# Patient Record
Sex: Male | Born: 1940
Health system: Southern US, Community
[De-identification: ages and names within clinical notes are randomized; demographics above are authoritative.]

## PROBLEM LIST (undated history)

## (undated) DIAGNOSIS — H9190 Unspecified hearing loss, unspecified ear: Secondary | ICD-10-CM

## (undated) DIAGNOSIS — E236 Other disorders of pituitary gland: Secondary | ICD-10-CM

## (undated) DIAGNOSIS — R7303 Prediabetes: Secondary | ICD-10-CM

## (undated) DIAGNOSIS — E039 Hypothyroidism, unspecified: Secondary | ICD-10-CM

## (undated) DIAGNOSIS — E2740 Unspecified adrenocortical insufficiency: Secondary | ICD-10-CM

## (undated) DIAGNOSIS — D369 Benign neoplasm, unspecified site: Secondary | ICD-10-CM

## (undated) DIAGNOSIS — I10 Essential (primary) hypertension: Secondary | ICD-10-CM

## (undated) HISTORY — DX: Unspecified adrenocortical insufficiency: E27.40

## (undated) HISTORY — PX: PELVIC FRACTURE SURGERY: SHX119

## (undated) HISTORY — DX: Benign neoplasm, unspecified site: D36.9

## (undated) HISTORY — PX: JOINT REPLACEMENT: SHX530

## (undated) HISTORY — PX: COLONOSCOPY: SHX174

## (undated) HISTORY — DX: Other disorders of pituitary gland: E23.6

## (undated) HISTORY — DX: Prediabetes: R73.03

## (undated) HISTORY — DX: Hypothyroidism, unspecified: E03.9

## (undated) HISTORY — PX: HERNIA REPAIR: SHX51

---

## 2008-04-30 ENCOUNTER — Encounter (INDEPENDENT_AMBULATORY_CARE_PROVIDER_SITE_OTHER): Payer: Self-pay | Admitting: Orthopedic Surgery

## 2008-04-30 ENCOUNTER — Inpatient Hospital Stay (HOSPITAL_COMMUNITY): Admission: RE | Admit: 2008-04-30 | Discharge: 2008-05-03 | Payer: Self-pay | Admitting: Orthopedic Surgery

## 2008-07-31 HISTORY — PX: CYST REMOVAL TRUNK: SHX6283

## 2008-07-31 HISTORY — PX: BILATERAL KNEE ARTHROSCOPY: SUR91

## 2009-01-07 ENCOUNTER — Inpatient Hospital Stay (HOSPITAL_COMMUNITY): Admission: RE | Admit: 2009-01-07 | Discharge: 2009-01-11 | Payer: Self-pay | Admitting: Orthopedic Surgery

## 2010-11-07 LAB — BASIC METABOLIC PANEL
BUN: 17 mg/dL (ref 6–23)
BUN: 7 mg/dL (ref 6–23)
BUN: 8 mg/dL (ref 6–23)
CO2: 29 mEq/L (ref 19–32)
CO2: 32 mEq/L (ref 19–32)
CO2: 32 mEq/L (ref 19–32)
Calcium: 7.8 mg/dL — ABNORMAL LOW (ref 8.4–10.5)
Calcium: 8.2 mg/dL — ABNORMAL LOW (ref 8.4–10.5)
Calcium: 8.4 mg/dL (ref 8.4–10.5)
Chloride: 101 mEq/L (ref 96–112)
Chloride: 98 mEq/L (ref 96–112)
Chloride: 99 mEq/L (ref 96–112)
Creatinine, Ser: 0.83 mg/dL (ref 0.4–1.5)
Creatinine, Ser: 0.86 mg/dL (ref 0.4–1.5)
Creatinine, Ser: 1.02 mg/dL (ref 0.4–1.5)
GFR calc Af Amer: >60 mL/min (ref >60)
GFR calc Af Amer: >60 mL/min (ref >60)
GFR calc Af Amer: >60 mL/min (ref >60)
GFR calc non Af Amer: >60 mL/min (ref >60)
GFR calc non Af Amer: >60 mL/min (ref >60)
GFR calc non Af Amer: >60 mL/min (ref >60)
Glucose, Bld: 116 mg/dL — ABNORMAL HIGH (ref 70–99)
Glucose, Bld: 126 mg/dL — ABNORMAL HIGH (ref 70–99)
Glucose, Bld: 126 mg/dL — ABNORMAL HIGH (ref 70–99)
Potassium: 3.7 mEq/L (ref 3.5–5.1)
Potassium: 3.8 mEq/L (ref 3.5–5.1)
Potassium: 4.4 mEq/L (ref 3.5–5.1)
Sodium: 134 mEq/L — ABNORMAL LOW (ref 135–145)
Sodium: 136 mEq/L (ref 135–145)
Sodium: 137 mEq/L (ref 135–145)

## 2010-11-07 LAB — HEMOGLOBIN AND HEMATOCRIT, BLOOD
HCT: 25.3 % — ABNORMAL LOW (ref 39.0–52.0)
HCT: 27.7 % — ABNORMAL LOW (ref 39.0–52.0)
HCT: 29.4 % — ABNORMAL LOW (ref 39.0–52.0)
HCT: 30.3 % — ABNORMAL LOW (ref 39.0–52.0)
HCT: 34.8 % — ABNORMAL LOW (ref 39.0–52.0)
Hemoglobin: 10.1 g/dL — ABNORMAL LOW (ref 13.0–17.0)
Hemoglobin: 10.1 g/dL — ABNORMAL LOW (ref 13.0–17.0)
Hemoglobin: 11.9 g/dL — ABNORMAL LOW (ref 13.0–17.0)
Hemoglobin: 9 g/dL — ABNORMAL LOW (ref 13.0–17.0)
Hemoglobin: 9.4 g/dL — ABNORMAL LOW (ref 13.0–17.0)

## 2010-11-07 LAB — COMPREHENSIVE METABOLIC PANEL
ALT: 52 U/L (ref 0–53)
AST: 30 U/L (ref 0–37)
Albumin: 4.4 g/dL (ref 3.5–5.2)
Alkaline Phosphatase: 45 U/L (ref 39–117)
BUN: 18 mg/dL (ref 6–23)
CO2: 31 mEq/L (ref 19–32)
Calcium: 10 mg/dL (ref 8.4–10.5)
Chloride: 105 mEq/L (ref 96–112)
Creatinine, Ser: 1.09 mg/dL (ref 0.4–1.5)
GFR calc Af Amer: >60 mL/min (ref >60)
GFR calc non Af Amer: >60 mL/min (ref >60)
Glucose, Bld: 91 mg/dL (ref 70–99)
Potassium: 4.8 mEq/L (ref 3.5–5.1)
Sodium: 144 mEq/L (ref 135–145)
Total Bilirubin: 0.8 mg/dL (ref 0.3–1.2)
Total Protein: 7.3 g/dL (ref 6.0–8.3)

## 2010-11-07 LAB — CBC
HCT: 45.2 % (ref 39.0–52.0)
Hemoglobin: 15 g/dL (ref 13.0–17.0)
MCHC: 33.3 g/dL (ref 30.0–36.0)
MCV: 99.6 fL (ref 78.0–100.0)
Platelets: 204 10*3/uL (ref 150–400)
RBC: 4.53 MIL/uL (ref 4.22–5.81)
RDW: 14.4 % (ref 11.5–15.5)
WBC: 5.7 10*3/uL (ref 4.0–10.5)

## 2010-11-07 LAB — TYPE AND SCREEN
ABO/RH(D): O NEG
Antibody Screen: POSITIVE
DAT, IgG: NEGATIVE
Donor AG Type: NEGATIVE
Donor AG Type: NEGATIVE
PT AG Type: NEGATIVE

## 2010-11-07 LAB — DIFFERENTIAL
Basophils Absolute: 0.1 10*3/uL (ref 0.0–0.1)
Basophils Relative: 1 % (ref 0–1)
Eosinophils Absolute: 0.2 10*3/uL (ref 0.0–0.7)
Eosinophils Relative: 3 % (ref 0–5)
Lymphocytes Relative: 30 % (ref 12–46)
Lymphs Abs: 1.7 10*3/uL (ref 0.7–4.0)
Monocytes Absolute: 0.6 10*3/uL (ref 0.1–1.0)
Monocytes Relative: 11 % (ref 3–12)
Neutro Abs: 3.1 10*3/uL (ref 1.7–7.7)
Neutrophils Relative %: 55 % (ref 43–77)

## 2010-11-07 LAB — PROTIME-INR
INR: 1 (ref 0.00–1.49)
INR: 1.3 (ref 0.00–1.49)
INR: 1.8 — ABNORMAL HIGH (ref 0.00–1.49)
INR: 1.8 — ABNORMAL HIGH (ref 0.00–1.49)
INR: 1.9 — ABNORMAL HIGH (ref 0.00–1.49)
Prothrombin Time: 13.7 seconds (ref 11.6–15.2)
Prothrombin Time: 16.8 seconds — ABNORMAL HIGH (ref 11.6–15.2)
Prothrombin Time: 21.4 seconds — ABNORMAL HIGH (ref 11.6–15.2)
Prothrombin Time: 22.3 seconds — ABNORMAL HIGH (ref 11.6–15.2)
Prothrombin Time: 22.7 seconds — ABNORMAL HIGH (ref 11.6–15.2)

## 2010-11-07 LAB — URINALYSIS, ROUTINE W REFLEX MICROSCOPIC
Bilirubin Urine: NEGATIVE
Glucose, UA: NEGATIVE mg/dL
Hgb urine dipstick: NEGATIVE
Ketones, ur: NEGATIVE mg/dL
Nitrite: NEGATIVE
Protein, ur: NEGATIVE mg/dL
Specific Gravity, Urine: 1.027 (ref 1.005–1.030)
Urobilinogen, UA: 1 mg/dL (ref 0.0–1.0)
pH: 6 (ref 5.0–8.0)

## 2010-11-07 LAB — APTT: aPTT: 28 seconds (ref 24–37)

## 2010-12-13 NOTE — Op Note (Signed)
NAMEUSIEL, ASTARITA                 ACCOUNT NO.:  [id number]   MEDICAL RECORD NO.:  [id number]          PATIENT TYPE:  INP   LOCATION:  1304                         FACILITY:  [facility]   PHYSICIAN:  Alvy Beal, MD    DATE OF BIRTH:  [birth year]   DATE OF PROCEDURE:  DATE OF DISCHARGE:                               OPERATIVE REPORT   PREOPERATIVE DIAGNOSIS:  Large synovial facet cyst right L2-3 with  central and lateral recess stenosis and nerve irritation.   POSTOPERATIVE DIAGNOSIS:  1. L2-3 decompression with facetectomy, right L2 and facet.  2. Instrumented fusion L2-3 with regional autograft bone.   INSTRUMENTATION:  Facet screw was a Synthes cancellus small frag screw  30 mm long with a washer.   COMPLICATIONS:  None.   Please refer to Dr. Jeannetta Ellis note for dictation on the specifics of the  approach and decompression and closure.  I was his assistant for the  decompression and then he assisted me for the fusion.   OPERATIVE NOTE:  The patient was brought to the operating room and  placed supine on the operating table.  At this point time. I assisted  Dr. Darrelyn Hillock in performing a posterior approach to the lumbar spine.  After appropriate x-rays demonstrated that we were at the L2-3 level,  he then performed a standard decompression.  We resected the entire L2  spinous process and  a complete right-sided laminectomy and facetectomy  of the right L2 inferior facet complex.  On the left-hand side, we did a  partial laminotomy and lateral recess decompression.   At this point in time, once he had completed decompression and, again,  please refer to his note for specifics on the decompression, there was a  large cyst that was noted that had caused an indentation on the  posterior aspect of the thecal sac which we were able to debride in  almost its entirety, except for the portion of at that was adherent to  the thecal sac.  This was sent to Pathology for permanent specimen.   At  this point in time, once we had an adequate lateral and central  decompression, I performed a foraminotomy at both the left and the right  sides at the L2 neural foramen.  At this point, I could clearly and  easily pass a hockey stick out both neural foramen and I visualized the  nerve root indicating demonstrating adequate decompression both  centrally and in the lateral recess.  We could also freely pass a  D'Errico inferiorly underneath the remaining portion of L3 and  superiorly towards the L1 spinous process.  We did take another x-ray  confirming that we had the L2 neural foramen decompressed with a hockey  stick in the neural foramen.  At this point, with the decompression  completed, I then, under power, advanced a 3/5 drill through the L2  inferior facet across the T3 facet complex and into the L3 facet joint.  I then palpated the hole and measured it to be about 28 mm. At this  point in time, I obtained a  30-mm cancellus screw with a washer and  advanced it through the facet complex.  I had very good purchase with  the bone.  With satisfactory unilateral facet screw fixation, we then  decorticated the lateral aspect of the facet complex and the facet joint  itself and packed it with the local bone that we had harvested from the  decompression.  I then took the hockey stick and palpated out the left  neural foramen ensuring that the facet screw did not violate the neural  foramen.  I also confirmed that we did not violate laterally by directly  looking at the medial wall of the pedicle and the lateral recess.  At  this point, with the  instrumentation properly positioned, we irrigated copiously with normal  saline, obtained hemostasis using bipolar electrocautery and maintaining  with FloSeal and thrombin Gelfoam.  At this point, I scrubbed out.  Dr.  Ciro Backer then performed a standard spine closure, again please refer to  his dictation for specifics.      Alvy Beal, MD  Electronically Signed     DDB/MEDQ  D:  04/30/2008  T:  05/01/2008  Job:  [id number]   cc:   Georges Lynch. Darrelyn Hillock, M.D.  Fax: [phone]

## 2010-12-13 NOTE — Op Note (Signed)
NAMECURTIES, CONIGLIARO                 ACCOUNT NO.:  [id number]   MEDICAL RECORD NO.:  [id number]          PATIENT TYPE:  INP   LOCATION:  0002                         FACILITY:  [facility]   PHYSICIAN:  Georges Lynch. Gioffre, M.D.DATE OF BIRTH:  [birth year]   DATE OF PROCEDURE:  01/07/2009  DATE OF DISCHARGE:                               OPERATIVE REPORT   SURGEON:  Dr. Darrelyn Hillock.   ASSISTANT:  Dr. Durene Romans.   PREOPERATIVE DIAGNOSES:  1. Severe degenerative arthritis with a genu varus right knee.  2. Severe genu varus with degenerative arthritis left knee.   Routine orthopedic prep and draping of both lower extremities were  carried out.  The patient had 2 grams of IV Ancef preop.  At this time,  the left leg was exsanguinated with an Esmarch and the tourniquet was  elevated to 350 mmHg.  The knee was flexed.  An incision was made over  the anterior aspect of the left knee.  Bleeders identified and  cauterized.  Two flaps were created.  I then carried out a median  parapatellar incision in the usual fashion.  The patella was reflected  laterally, the knee was flexed and I did medial and lateral  meniscectomies and excised the anterior and posterior cruciate ligament.  Following that, initial drill hole was made in the intercondylar notch  of the femur.  A #1 jig was inserted and I removed 11 mm thickness off  the distal femur.  Following that, we measured the femur to be a size 4  on the left.  At that time, we then went on and made our appropriate cut  for a size 4 left femur.  After the femur was prepared, we then prepared  the tibia.  The left tibia measured a size 5 tibial tray.  We then  prepared the tibial plateau in the usual fashion.  I removed 4 mm  thickness off the affected medial side.  The intramedullary guide rod  was used.  We then cut our keel cut out the tibia in the usual fashion.  Following that, we then cut our notch cut out of the distal femur.  The  trial components  were inserted.  We had excellent stability, excellent  motion with a 12.5 mm thickness size 4 insert.  We then cut our patella  in the usual fashion.  We did a resurfacing procedure on the patella.  We then measured the patella be a size 41.  Three drill holes were made  in the patella.  All trial components were removed.  We thoroughly  irrigated out the knee.  Note, that we did make sure we had to the  posterior spurs and we had several loose bodies in the posterior  compartment that were removed from the distal femur as well.  After we  went through our trials, we then removed the trials and then thoroughly  irrigated out the knee.  We dried the knee out and cemented all three  components in simultaneously.  Vancomycin was used in the cement.  We  utilized a size 4 left  femur, a size 5 left tibial tray.  The insert was  a size 4 rotating platform 12.5 mm thick.  The patella was a 41-mm  patella.  After all components were cemented and the cement was  hardened, we removed all loose pieces of cement in the usual fashion.  We water picked the knee out and then inserted a Hemovac drain and  closed the knee in layers in the usual fashion.  Note, the tourniquet  was released prior to closing the wound.  Dr. Charlann Boxer continued to close  the wound and I went approached the right knee.  The right knee was  flexed.  An incision was made in the usual fashion in the midline.  Bleeders identified and cauterized.  Two flaps were created.  I then  made a median parapatellar incision and reflected the patella laterally,  flexed the knee and did medial and lateral meniscectomies and excised  the anterior and posterior cruciate ligaments.  Following that, the  initial drill hole was made in the intercondylar notch.  A #1 jig was  inserted and I removed 11 mm thickness off the distal femur.  The distal  femur then was measured to be a size 5 on the right.  Following that, we  then made our cut for a size 5  right femur, a posterior cruciate  sacrificing type.  After the femur was prepared, we then prepared the  tibia in the usual fashion for a size 5 tibial tray.  We cut our keel  cut in the usual fashion.  The intramedullary guide rods were used.  After the tibia was prepared, we then cut our notch cut out of the  distal femur on the right in the usual fashion.  We then irrigated the  knee out and inserted our trial components.  We had excellent fit with a  size 5.5 mm thickness rotating platform trial component.  After that, we  reduced the knee and had good stability and good motion.  We then  prepared our patella by doing a resurfacing procedure on the patella for  a size 41-mm patella.  Three drill holes were made in the patella.  I  thoroughly irrigated out the area after we removed the trial components  and then we cemented all three components in simultaneously.  The femur  was a size 5 right.  The tibia tray was a size 5.  The insert was a size  5, 12.5 mm thickness.  The patella was a size 41 patella.  All three  components were cemented as I mentioned.  After the cement was dried, we  irrigated the knee out and removed all loose pieces of cement.  I then  injected 10 mL of FloSeal into the posterior and medial and lateral  compartments for hemostasis purposes.  Note, the tourniquet was let down  prior to doing this.  We then removed the trial component and inserted  our permanent tibial component.  We inserted our permanent rotating  platform component, reduced the knee, took the knee through motion and  had excellent function.  A Hemovac drain was inserted and the knee was  closed in layers in the usual fashion.  The skin was closed metal  staples.  Sterile Neosporin dressings were applied to both knees.  The  patient left the operative room in satisfactory condition.           ______________________________  Georges Lynch Darrelyn Hillock, M.D.     RAG/MEDQ  D:  01/07/2009  T:   01/07/2009  Job:  [id number]

## 2010-12-13 NOTE — Op Note (Signed)
[name], Julian Harris                 ACCOUNT NO.:  [id number]   MEDICAL RECORD NO.:  [id number]          PATIENT TYPE:  INP   LOCATION:  0007                         FACILITY:  [facility]   PHYSICIAN:  Georges Lynch. Gioffre, M.D.DATE OF BIRTH:  03-19-1941   DATE OF PROCEDURE:  04/30/2008  DATE OF DISCHARGE:                               OPERATIVE REPORT   SURGEON:  1. Dr. Darrelyn Hillock on the decompressive lumbar laminectomy.  Dr. Venita Lick  was the assistant.  2. Dr. Shon Baton was the surgeon for the facet fusion on the left at L2-      3; Dr. Darrelyn Hillock is the assistant.   I am dictating only for the decompression portion.   PREOPERATIVE DIAGNOSIS:  Severe spinal stenosis at L2-3 with a large  synovial cyst at L2-3.   POSTOPERATIVE DIAGNOSIS:  Severe spinal stenosis at L2-3 with a large  synovial cyst at L2-3.   OPERATION:  1. From my standpoint was a complete decompressive lumbar laminectomy      with facetectomy on the right at L2-3.  2. Excision of a synovial cyst at L2-3.   PROCEDURE:  Under general anesthesia, the patient on a spinal frame,  routine orthopedic prepping and draping of the back carried out.  He had  1 g of IV Ancef.  Two needles were placed in the back for localization  purposes.  An x-ray was taken.  Following that, I then went down and  identified with x-rays again the exact L2 and L3 interspace.  I removed  the spinous process of L2 and a portion of L3.  We went down and  protected the dura at all times, and we decompressed the central part  and went out and then removed the facet at L2-3 on the right.  We  preserved the L2-3 facet on the left.  We did foraminotomies as well.  We identified a large synovial cyst that was strictly adhesed to the  dura.  We peeled off as much as we safely could, sent the specimen down  to the lab.  We had a nice decompression at this time.  We felt because  of sacrificing the facet which we had to do on the right where the  synovial  cyst came about that we would need to fuse the facet on the  left at L2-3 with a screw and bone graft, and Dr. Shon Baton will dictate  that part.  I was the assistant for that.  Thoroughly irrigated out the  wound at the end of the procedure.  We  injected 5 mL of FloSeal over the dura and then loosely applied some  thrombin-soaked Gelfoam.  The wound was closed in layers in usual  fashion.  I did leave a small distal portion of the deep part of the  wound open for drainage purposes.  The skin was closed with metal  staples.  Sterile Neosporin dressing was applied.           ______________________________  Georges Lynch Darrelyn Hillock, M.D.     RAG/MEDQ  D:  04/30/2008  T:  05/01/2008  Job:  [id number]   cc:   Georges Lynch. Darrelyn Hillock, M.D.  Fax: [phone]   Alvy Beal, MD  Fax: [phone]

## 2010-12-13 NOTE — Discharge Summary (Signed)
Julian Harris, Julian Harris                 ACCOUNT NO.:  [id number]   MEDICAL RECORD NO.:  [id number]          PATIENT TYPE:  INP   LOCATION:  1605                         [facility]ACILITY:  [facility]   PHYSICIAN:  Georges Lynch. Gioffre, M.D.DATE OF BIRTH:  11-25-1940   DATE OF ADMISSION:  01/07/2009  DATE OF DISCHARGE:                               DISCHARGE SUMMARY   PROCEDURE PERFORMED:  Bilateral total knee arthroplasty.   Postop he had Hemovac drains, removed the following day on January 08, 2009.  Note, also postop his hemoglobin was watched very carefully, on  January 08, 2009 his hemoglobin was 10/01.  Our plans were made immediately  for _________skilled nursing facility which we had preplanned before he  came in the hospital.  He was maintained on the heparin and Coumadin  protocol.  He will be maintained on that for 4-6 weeks postop.  While in  the hospital he was seen again on January 09, 2009, he had no postoperative  complications.  He was started on the bilateral knee machines in the  hospital from 0 to 45 degrees and it was advanced 10 degrees a day and  he was on the machines for 6 hours at a time.  On January 10, 2009 he was  doing well, his wounds looked fine.  Hemoglobin was reported at 9.4.  His INR was 1.9.  On January 11, 2009 hemoglobin was 9, he was afebrile, actually his  temperature was 99 degrees, oxygen saturation 100%.  His dressings were  changed by me and his wounds looked good.  I did discuss with him the  skilled nursing facility plans, told him we would plan to have him go to  the skilled nursing facility today or tomorrow when available.   LABORATORY DATA:  His labs on admission was white count 5700, hemoglobin  15, hematocrit 45.  His platelets were 204.  Differential was normal.  Sodium 144, potassium 4.8, chloride 105, glucose 91, BUN 80, creatinine  1.09.  His SGOT was 30, SGPT 52, alkaline phosphatase 45, total  bilirubin 0.8.  His INR was 1, prothrombin time 13.7 and PTT was  28.  Urinalysis was negative.  Chest x-rays showed no active lung disease.  He had a EKG done and it was within normal limits.   DISCHARGE CONDITION:  Improved.   DISCHARGE DIET:  Regular diet.   DISCHARGE MEDICATIONS:  1. He will be on Coumadin 5 mg a day.  2. Ferrous sulfate 325 mg t.i.d.  3. Lisinopril 20 mg a day.  4. Hydrochlorothiazide 12.5 mg a day.  5. Robaxin 500 mg t.i.d. p.r.n. for spasms by mouth  6. Ambien 5 mg by mouth p.r.n. for sleep.  7. Dulcolax suppository q.6 h p.r.n. constipation.  8. Percocet 10/650 one every 4 hours p.r.n. for pain.   DISCHARGE INSTRUCTIONS:  1. Ambulate full weightbearing with a walker.  2. He must be seen by me in the office 2 weeks from the day of      surgery.  Surgery date was January 07, 2009.  3. He will have to have INRs  done weekly.  4. On Wednesday of this week he should have a CBC and INR done.  That      should be faxed to my office at [phone].  His INRs after that      should be done on a weekly basis.  5. His dressing should be changed daily.           ______________________________  Georges Lynch Darrelyn Hillock, M.D.     RAG/MEDQ  D:  01/11/2009  T:  01/11/2009  Job:  [id number]

## 2010-12-16 NOTE — Discharge Summary (Signed)
Julian Harris, Julian Harris                 ACCOUNT NO.:  [id number]   MEDICAL RECORD NO.:  [id number]          PATIENT TYPE:  INP   LOCATION:  1304                         [facility]   PHYSICIAN:  Georges Lynch. Gioffre, M.D.DATE OF BIRTH:  1941/04/07   DATE OF ADMISSION:  04/30/2008  DATE OF DISCHARGE:  05/03/2008                               DISCHARGE SUMMARY   ADMISSION DIAGNOSES:  1. Spinal stenosis, L2-3.  2. Hypertension.   DISCHARGE DIAGNOSES:  1. Status post decompressive lumbar laminectomy at L2-3 with excision      of synovial cyst at L2-3 with facet fusion at L2-3 with bone graft.  2. Hypertension.   SURGICAL PROCEDURE:  On 08/01/2007, the patient was taken to the OR by  Dr. Worthy Rancher, assisted by Dr. Venita Lick.  The patient underwent  general anesthesia and had a decompressive lumbar laminectomy at L2-3  with excision of synovial cyst and a facet fusion at L2-3 with bone  graft.  This was done under general anesthesia.  The synovial cyst was  sent to Pathology.  Estimated blood loss was 250 mL.  There were no  complications.   CONSULTS:  Routine physical therapy and case management consults.   HOSPITAL COURSE:  On 08/01/2007, the patient was admitted to [facility] under the care of Dr. Worthy Rancher.  The patient was taken to  the OR where the above-mentioned surgical excision was performed without  any complications.  The patient was transferred to the recovery room and  then to the orthopedic floor in good condition on IV pain medicines and  antibiotics.  The patient then incurred 3 days postoperative course on  the orthopedic floor in which the patient did very well.  The patient's  vital signs remained stable.  The patient's wound remained benign for  any signs of infection.  The patient remained neurologically intact.  The patient was able to transition from IV medicines to p.o. meds well.  The patient was able to ambulate with physical therapy without  any  issues and on postop day #3 the patient was medically and orthopedically  stable where he could be discharged home.  The patient was  neurologically intact.  Pain was significantly better.  He was  tolerating his p.o. meds well and was discharged in good condition.   LABS:  CBC on admission found WBC 5.3, hemoglobin 14.9, hematocrit 44.1,  platelets 194,000.  INR on admission was 1.0 with PTT 22.  Routine  chemistries on admission were within normal limits with a glucose of  108.  Estimated GFR greater than 60.  Routine urinalysis was normal.   DISCHARGE INSTRUCTIONS:  1. Diet:  No restrictions.  2. Activity:  Slowly increase activity as instructed by Physical      Therapy with use of a walker.  3. Wound care:  The patient is to change his dressing daily.  4. Follow-up:  The patient is to have follow-up appointment with Dr.      Darrelyn Hillock in his office 2 weeks from date of surgery.  The patient is  to call [phone] for that follow-up appointment.   DISCHARGE MEDICATIONS:  1. Percocet 10/650 one tablet every 4-6 hours for pain if needed.  2. Robaxin 500 mg once every 6 hours for muscle spasms if needed.  3. Lisinopril 20/12.5 mg once a day.  4. Omega 3 fish oil 2 tablets a day.  5. Vitamin C 1000 mg 2 tablets once a day.  6. Vitamin E 1000 international units daily.   DISCHARGE CONDITION:  The patient's condition upon discharge to home is  listed as improved and good.      Jamelle Rushing, P.A.    ______________________________  Georges Lynch Darrelyn Hillock, M.D.    RWK/MEDQ  D:  05/20/2008  T:  05/20/2008  Job:  [id number]

## 2011-05-01 LAB — CBC
HCT: 44.1
Hemoglobin: 14.9
MCHC: 33.7
MCV: 98.9
Platelets: 194
RBC: 4.46
RDW: 13.6
WBC: 5.3

## 2011-05-01 LAB — COMPREHENSIVE METABOLIC PANEL
ALT: 47
AST: 31
Albumin: 4.1
Alkaline Phosphatase: 43
BUN: 19
CO2: 31
Calcium: 10.2
Chloride: 101
Creatinine, Ser: 0.87
GFR calc Af Amer: >60
GFR calc non Af Amer: >60
Glucose, Bld: 108 — ABNORMAL HIGH
Potassium: 4.8
Sodium: 143
Total Bilirubin: 1.1
Total Protein: 7.2

## 2011-05-01 LAB — URINALYSIS, ROUTINE W REFLEX MICROSCOPIC
Bilirubin Urine: NEGATIVE
Glucose, UA: NEGATIVE
Hgb urine dipstick: NEGATIVE
Ketones, ur: NEGATIVE
Nitrite: NEGATIVE
Protein, ur: NEGATIVE
Specific Gravity, Urine: 1.023
Urobilinogen, UA: 0.2
pH: 5.5

## 2011-05-01 LAB — DIFFERENTIAL
Basophils Absolute: 0
Basophils Relative: 1
Eosinophils Absolute: 0.2
Eosinophils Relative: 3
Lymphocytes Relative: 29
Lymphs Abs: 1.5
Monocytes Absolute: 0.6
Monocytes Relative: 11
Neutro Abs: 3
Neutrophils Relative %: 57

## 2011-05-01 LAB — PROTIME-INR
INR: 1
Prothrombin Time: 13.1

## 2011-05-01 LAB — APTT: aPTT: 22 — ABNORMAL LOW

## 2011-08-09 DIAGNOSIS — J019 Acute sinusitis, unspecified: Secondary | ICD-10-CM | POA: Diagnosis not present

## 2011-11-01 DIAGNOSIS — I10 Essential (primary) hypertension: Secondary | ICD-10-CM | POA: Diagnosis not present

## 2011-11-01 DIAGNOSIS — Z125 Encounter for screening for malignant neoplasm of prostate: Secondary | ICD-10-CM | POA: Diagnosis not present

## 2011-11-01 DIAGNOSIS — E559 Vitamin D deficiency, unspecified: Secondary | ICD-10-CM | POA: Diagnosis not present

## 2011-11-01 DIAGNOSIS — R7301 Impaired fasting glucose: Secondary | ICD-10-CM | POA: Diagnosis not present

## 2012-05-01 DIAGNOSIS — I10 Essential (primary) hypertension: Secondary | ICD-10-CM | POA: Diagnosis not present

## 2012-05-01 DIAGNOSIS — Z125 Encounter for screening for malignant neoplasm of prostate: Secondary | ICD-10-CM | POA: Diagnosis not present

## 2012-05-01 DIAGNOSIS — E669 Obesity, unspecified: Secondary | ICD-10-CM | POA: Diagnosis not present

## 2012-05-01 DIAGNOSIS — Z0000 Encounter for general adult medical examination without abnormal findings: Secondary | ICD-10-CM | POA: Diagnosis not present

## 2012-05-01 DIAGNOSIS — E559 Vitamin D deficiency, unspecified: Secondary | ICD-10-CM | POA: Diagnosis not present

## 2012-05-01 DIAGNOSIS — R7301 Impaired fasting glucose: Secondary | ICD-10-CM | POA: Diagnosis not present

## 2012-05-08 ENCOUNTER — Other Ambulatory Visit: Payer: Self-pay | Admitting: Internal Medicine

## 2012-05-08 DIAGNOSIS — E559 Vitamin D deficiency, unspecified: Secondary | ICD-10-CM | POA: Diagnosis not present

## 2012-05-08 DIAGNOSIS — R7301 Impaired fasting glucose: Secondary | ICD-10-CM | POA: Diagnosis not present

## 2012-05-08 DIAGNOSIS — R7401 Elevation of levels of liver transaminase levels: Secondary | ICD-10-CM | POA: Diagnosis not present

## 2012-05-08 DIAGNOSIS — N182 Chronic kidney disease, stage 2 (mild): Secondary | ICD-10-CM | POA: Diagnosis not present

## 2012-05-08 DIAGNOSIS — R7402 Elevation of levels of lactic acid dehydrogenase (LDH): Secondary | ICD-10-CM

## 2012-05-08 DIAGNOSIS — I10 Essential (primary) hypertension: Secondary | ICD-10-CM | POA: Diagnosis not present

## 2012-05-13 ENCOUNTER — Ambulatory Visit
Admission: RE | Admit: 2012-05-13 | Discharge: 2012-05-13 | Disposition: A | Discharge status: Home / Self Care | Payer: Medicare Other | Source: Ambulatory Visit | Attending: Internal Medicine | Admitting: Internal Medicine

## 2012-05-13 DIAGNOSIS — R7402 Elevation of levels of lactic acid dehydrogenase (LDH): Secondary | ICD-10-CM

## 2012-05-13 DIAGNOSIS — K7689 Other specified diseases of liver: Secondary | ICD-10-CM | POA: Diagnosis not present

## 2012-10-30 DIAGNOSIS — I10 Essential (primary) hypertension: Secondary | ICD-10-CM | POA: Diagnosis not present

## 2012-10-30 DIAGNOSIS — R7301 Impaired fasting glucose: Secondary | ICD-10-CM | POA: Diagnosis not present

## 2012-10-30 DIAGNOSIS — E559 Vitamin D deficiency, unspecified: Secondary | ICD-10-CM | POA: Diagnosis not present

## 2012-11-06 DIAGNOSIS — E559 Vitamin D deficiency, unspecified: Secondary | ICD-10-CM | POA: Diagnosis not present

## 2012-11-06 DIAGNOSIS — R7301 Impaired fasting glucose: Secondary | ICD-10-CM | POA: Diagnosis not present

## 2012-11-06 DIAGNOSIS — I10 Essential (primary) hypertension: Secondary | ICD-10-CM | POA: Diagnosis not present

## 2012-11-06 DIAGNOSIS — N182 Chronic kidney disease, stage 2 (mild): Secondary | ICD-10-CM | POA: Diagnosis not present

## 2013-05-05 DIAGNOSIS — E559 Vitamin D deficiency, unspecified: Secondary | ICD-10-CM | POA: Diagnosis not present

## 2013-05-05 DIAGNOSIS — I10 Essential (primary) hypertension: Secondary | ICD-10-CM | POA: Diagnosis not present

## 2013-05-05 DIAGNOSIS — Z1331 Encounter for screening for depression: Secondary | ICD-10-CM | POA: Diagnosis not present

## 2013-05-05 DIAGNOSIS — Z125 Encounter for screening for malignant neoplasm of prostate: Secondary | ICD-10-CM | POA: Diagnosis not present

## 2013-05-05 DIAGNOSIS — Z0000 Encounter for general adult medical examination without abnormal findings: Secondary | ICD-10-CM | POA: Diagnosis not present

## 2013-05-05 DIAGNOSIS — R7301 Impaired fasting glucose: Secondary | ICD-10-CM | POA: Diagnosis not present

## 2013-05-12 DIAGNOSIS — I10 Essential (primary) hypertension: Secondary | ICD-10-CM | POA: Diagnosis not present

## 2013-05-12 DIAGNOSIS — R7301 Impaired fasting glucose: Secondary | ICD-10-CM | POA: Diagnosis not present

## 2013-05-12 DIAGNOSIS — E559 Vitamin D deficiency, unspecified: Secondary | ICD-10-CM | POA: Diagnosis not present

## 2013-05-12 DIAGNOSIS — N182 Chronic kidney disease, stage 2 (mild): Secondary | ICD-10-CM | POA: Diagnosis not present

## 2013-10-03 DIAGNOSIS — Z96659 Presence of unspecified artificial knee joint: Secondary | ICD-10-CM | POA: Diagnosis not present

## 2013-10-03 DIAGNOSIS — IMO0002 Reserved for concepts with insufficient information to code with codable children: Secondary | ICD-10-CM | POA: Diagnosis not present

## 2013-10-03 DIAGNOSIS — M1710 Unilateral primary osteoarthritis, unspecified knee: Secondary | ICD-10-CM | POA: Diagnosis not present

## 2013-10-28 DIAGNOSIS — R7301 Impaired fasting glucose: Secondary | ICD-10-CM | POA: Diagnosis not present

## 2013-10-28 DIAGNOSIS — I10 Essential (primary) hypertension: Secondary | ICD-10-CM | POA: Diagnosis not present

## 2013-10-28 DIAGNOSIS — E559 Vitamin D deficiency, unspecified: Secondary | ICD-10-CM | POA: Diagnosis not present

## 2013-10-28 DIAGNOSIS — L723 Sebaceous cyst: Secondary | ICD-10-CM | POA: Diagnosis not present

## 2013-11-10 DIAGNOSIS — I10 Essential (primary) hypertension: Secondary | ICD-10-CM | POA: Diagnosis not present

## 2013-11-10 DIAGNOSIS — R7301 Impaired fasting glucose: Secondary | ICD-10-CM | POA: Diagnosis not present

## 2013-11-10 DIAGNOSIS — E559 Vitamin D deficiency, unspecified: Secondary | ICD-10-CM | POA: Diagnosis not present

## 2013-11-10 DIAGNOSIS — N182 Chronic kidney disease, stage 2 (mild): Secondary | ICD-10-CM | POA: Diagnosis not present

## 2013-11-25 ENCOUNTER — Encounter (INDEPENDENT_AMBULATORY_CARE_PROVIDER_SITE_OTHER): Payer: Self-pay | Admitting: General Surgery

## 2013-11-25 ENCOUNTER — Ambulatory Visit (INDEPENDENT_AMBULATORY_CARE_PROVIDER_SITE_OTHER): Payer: Medicare Other | Admitting: General Surgery

## 2013-11-25 VITALS — BP 125/75 | HR 77 | Temp 97.0°F | Resp 16 | Ht 69.0 in | Wt 215.6 lb

## 2013-11-25 DIAGNOSIS — L723 Sebaceous cyst: Secondary | ICD-10-CM

## 2013-11-25 DIAGNOSIS — L720 Epidermal cyst: Secondary | ICD-10-CM | POA: Insufficient documentation

## 2013-11-25 NOTE — Progress Notes (Signed)
Patient ID: Julian Harris, male   DOB: 25-Jul-1941, 73 y.o.   MRN: [id number]  Chief Complaint  Patient presents with  . Cyst    HPI Julian Harris is a 73 y.o. male.  He is referred by Dr. Thressa Sheller at [facility] for evaluation of an epidermoid cyst of the posterior neck.  This mass has been present for at least 2 years. It will occasionally become tender and drain. Intermittently it is asymptomatic. 4 which ago it became as large as 2.5 cm it was painful and drained. About a settling down again.  Comorbidities include hypertension, obesity, impaired glucose tolerance, mild renal insufficiency, fatty liver  HPI  History reviewed. No pertinent past medical history.  Past Surgical History  Procedure Laterality Date  . Bilateral knee arthroscopy  2010  . Cyst removal trunk  2010    History reviewed. No pertinent family history.  Social History History  Substance Use Topics  . Smoking status: Former Smoker    Quit date: 11/25/1973  . Smokeless tobacco: Not on file  . Alcohol Use: No    No Known Allergies  Current Outpatient Prescriptions  Medication Sig Dispense Refill  . lisinopril-hydrochlorothiazide (PRINZIDE,ZESTORETIC) 20-12.5 MG per tablet Take 1 tablet by mouth daily.       No current facility-administered medications for this visit.    Review of Systems Review of Systems  Constitutional: Negative for fever, chills and unexpected weight change.  HENT: Negative for congestion, hearing loss, sore throat, trouble swallowing and voice change.   Eyes: Negative for visual disturbance.  Respiratory: Negative for cough and wheezing.   Cardiovascular: Negative for chest pain, palpitations and leg swelling.  Gastrointestinal: Negative for nausea, vomiting, abdominal pain, diarrhea, constipation, blood in stool, abdominal distention, anal bleeding and rectal pain.  Genitourinary: Negative for hematuria and difficulty urinating.  Musculoskeletal:  Negative for arthralgias.  Skin: Negative for rash and wound.  Neurological: Negative for seizures, syncope, weakness and headaches.  Hematological: Negative for adenopathy. Does not bruise/bleed easily.  Psychiatric/Behavioral: Negative for confusion.    Blood pressure 125/75, pulse 77, temperature 97 F (36.1 C), resp. rate 16, height 5\' 9"  (1.753 m), weight 215 lb 9.6 oz (97.796 kg).  Physical Exam Physical Exam  Constitutional: He is oriented to person, place, and time. He appears well-developed and well-nourished. No distress.  HENT:  Head: Normocephalic.  Nose: Nose normal.  Mouth/Throat: No oropharyngeal exudate.  Eyes: Conjunctivae and EOM are normal. Pupils are equal, round, and reactive to light. Right eye exhibits no discharge. Left eye exhibits no discharge. No scleral icterus.  Neck: Normal range of motion. Neck supple. No JVD present. No tracheal deviation present. No thyromegaly present.  In the posterior neck, to the right of the midline, just below the hairline is a 1.5 cm cystic mass with a small opening. There is no saline as her by mouth drainage. This bleeds easily. Not obviously infected.  Cardiovascular: Normal rate, regular rhythm, normal heart sounds and intact distal pulses.   No murmur heard. Pulmonary/Chest: Effort normal and breath sounds normal. No stridor. No respiratory distress. He has no wheezes. He has no rales. He exhibits no tenderness.  Abdominal: Soft. Bowel sounds are normal. He exhibits no distension and no mass. There is no tenderness. There is no rebound and no guarding.  Musculoskeletal: Normal range of motion. He exhibits no edema and no tenderness.  Lymphadenopathy:    He has no cervical adenopathy.  Neurological: He is alert and oriented to  person, place, and time. He has normal reflexes. Coordination normal.  Skin: Skin is warm and dry. No rash noted. He is not diaphoretic. No erythema. No pallor.  Psychiatric: He has a normal mood and  affect. His behavior is normal. Judgment and thought content normal.    Data Reviewed Office notes from [place]  Assessment    Epidermoid cyst, right posterior neck and hairline, 1.5 cm.  Hypertension  Mild obesity  Impaired glucose Tolerance     Plan    Scheduled for excision of epidermoid cyst right posterior neck, 1.5 cm local anesthesia in the office in the next week or [place] 11/25/2013, 9:39 AM

## 2013-11-25 NOTE — Patient Instructions (Signed)
You have an epidermoid cyst of the posterior neck that has been getting infected intermittently over the years. This will continue to happen until you do something about it.  We have decided to schedule you for excision of the posterior neck cyst in the office sometime in the next week or so.

## 2013-12-05 ENCOUNTER — Other Ambulatory Visit (INDEPENDENT_AMBULATORY_CARE_PROVIDER_SITE_OTHER): Payer: Self-pay | Admitting: General Surgery

## 2013-12-05 ENCOUNTER — Ambulatory Visit (INDEPENDENT_AMBULATORY_CARE_PROVIDER_SITE_OTHER): Payer: Medicare Other | Admitting: General Surgery

## 2013-12-05 ENCOUNTER — Encounter (INDEPENDENT_AMBULATORY_CARE_PROVIDER_SITE_OTHER): Payer: Self-pay | Admitting: General Surgery

## 2013-12-05 VITALS — BP 119/84 | HR 70 | Temp 97.5°F | Resp 16 | Ht 69.0 in | Wt 213.0 lb

## 2013-12-05 DIAGNOSIS — L720 Epidermal cyst: Secondary | ICD-10-CM

## 2013-12-05 DIAGNOSIS — L723 Sebaceous cyst: Secondary | ICD-10-CM

## 2013-12-05 NOTE — Progress Notes (Signed)
Patient ID: Julian Harris, male   DOB: 11-17-1940, 73 y.o.   MRN: [id number] History:  this patient returns electively for excision of the chronic inflamed cyst of his right posterior neck at the hairline. I discussed the procedure in detail. I discussed the risks and wound care.  Procedure note: Room 3. Prone position with lots of cushioning. Posterior neck prepped with Betadine. 1% Xylocaine with epinephrine local. Conservative transverse elliptical incision. Chronically inflamed cyst excised all the way down to the fascia. No pus encountered. Specimen sent to pathology. Hemostasis very good. Tissues closed with interrupted 3-0 Vicryl. No skin sutures. Dry gauze bandage. Tolerated well  Assessment: Chronically inflamed epidermoid cyst right posterior neck and hairline  Plan: Wound care discussed. Written instructions given Will call pathology report the patient next Tuesday or Wednesday Return as needed or if there is bleeding or infection.   Edsel Petrin. Dalbert Batman, M.D., [facility] General and Minimally invasive Surgery Breast and Colorectal Surgery Office:   [phone] Pager:   [phone]

## 2013-12-05 NOTE — Patient Instructions (Signed)
We removed the chronically infected cyst from your posterior neck. This was sent to the pathology laboratory. We should be able to call the pathology report to you next Tuesday or Wednesday.  As we discussed, I closed the incision loosely so that it would be able to drain a little bit and lower the chance of infection.  You may take a quick shower, starting tomorrow night. No tub baths. No hot tubs.  Expect a little drainage. Keep a dry gauze bandage or a large Band-Aid on this at all times other than when you are taking a shower.  Return to see Dr. Dalbert Batman if there are any problems.

## 2013-12-08 NOTE — Progress Notes (Signed)
Quick Note:  Inform patient of Pathology report,. ______ 

## 2013-12-09 ENCOUNTER — Telehealth (INDEPENDENT_AMBULATORY_CARE_PROVIDER_SITE_OTHER): Payer: Self-pay | Admitting: General Surgery

## 2013-12-09 NOTE — Telephone Encounter (Signed)
Called patient to let him know that his path report was good, no sign of cancer

## 2014-05-05 DIAGNOSIS — I10 Essential (primary) hypertension: Secondary | ICD-10-CM | POA: Diagnosis not present

## 2014-05-05 DIAGNOSIS — E559 Vitamin D deficiency, unspecified: Secondary | ICD-10-CM | POA: Diagnosis not present

## 2014-05-05 DIAGNOSIS — Z1389 Encounter for screening for other disorder: Secondary | ICD-10-CM | POA: Diagnosis not present

## 2014-05-05 DIAGNOSIS — Z0000 Encounter for general adult medical examination without abnormal findings: Secondary | ICD-10-CM | POA: Diagnosis not present

## 2014-05-05 DIAGNOSIS — R7301 Impaired fasting glucose: Secondary | ICD-10-CM | POA: Diagnosis not present

## 2014-05-11 DIAGNOSIS — H25033 Anterior subcapsular polar age-related cataract, bilateral: Secondary | ICD-10-CM | POA: Diagnosis not present

## 2014-05-12 DIAGNOSIS — E559 Vitamin D deficiency, unspecified: Secondary | ICD-10-CM | POA: Diagnosis not present

## 2014-05-12 DIAGNOSIS — I10 Essential (primary) hypertension: Secondary | ICD-10-CM | POA: Diagnosis not present

## 2014-05-12 DIAGNOSIS — R7301 Impaired fasting glucose: Secondary | ICD-10-CM | POA: Diagnosis not present

## 2014-05-12 DIAGNOSIS — N182 Chronic kidney disease, stage 2 (mild): Secondary | ICD-10-CM | POA: Diagnosis not present

## 2014-05-12 DIAGNOSIS — K7581 Nonalcoholic steatohepatitis (NASH): Secondary | ICD-10-CM | POA: Diagnosis not present

## 2014-11-04 DIAGNOSIS — R7301 Impaired fasting glucose: Secondary | ICD-10-CM | POA: Diagnosis not present

## 2014-11-04 DIAGNOSIS — I10 Essential (primary) hypertension: Secondary | ICD-10-CM | POA: Diagnosis not present

## 2014-11-04 DIAGNOSIS — E559 Vitamin D deficiency, unspecified: Secondary | ICD-10-CM | POA: Diagnosis not present

## 2014-11-11 DIAGNOSIS — N182 Chronic kidney disease, stage 2 (mild): Secondary | ICD-10-CM | POA: Diagnosis not present

## 2014-11-11 DIAGNOSIS — R7309 Other abnormal glucose: Secondary | ICD-10-CM | POA: Diagnosis not present

## 2014-11-11 DIAGNOSIS — I10 Essential (primary) hypertension: Secondary | ICD-10-CM | POA: Diagnosis not present

## 2014-11-11 DIAGNOSIS — E559 Vitamin D deficiency, unspecified: Secondary | ICD-10-CM | POA: Diagnosis not present

## 2015-05-13 DIAGNOSIS — R7309 Other abnormal glucose: Secondary | ICD-10-CM | POA: Diagnosis not present

## 2015-05-13 DIAGNOSIS — F17211 Nicotine dependence, cigarettes, in remission: Secondary | ICD-10-CM | POA: Diagnosis not present

## 2015-05-13 DIAGNOSIS — Z0000 Encounter for general adult medical examination without abnormal findings: Secondary | ICD-10-CM | POA: Diagnosis not present

## 2015-05-13 DIAGNOSIS — Z1389 Encounter for screening for other disorder: Secondary | ICD-10-CM | POA: Diagnosis not present

## 2015-05-13 DIAGNOSIS — I10 Essential (primary) hypertension: Secondary | ICD-10-CM | POA: Diagnosis not present

## 2015-05-13 DIAGNOSIS — E559 Vitamin D deficiency, unspecified: Secondary | ICD-10-CM | POA: Diagnosis not present

## 2015-05-13 DIAGNOSIS — Z125 Encounter for screening for malignant neoplasm of prostate: Secondary | ICD-10-CM | POA: Diagnosis not present

## 2015-05-20 DIAGNOSIS — F17211 Nicotine dependence, cigarettes, in remission: Secondary | ICD-10-CM | POA: Diagnosis not present

## 2015-05-20 DIAGNOSIS — E559 Vitamin D deficiency, unspecified: Secondary | ICD-10-CM | POA: Diagnosis not present

## 2015-05-20 DIAGNOSIS — N182 Chronic kidney disease, stage 2 (mild): Secondary | ICD-10-CM | POA: Diagnosis not present

## 2015-05-20 DIAGNOSIS — R7309 Other abnormal glucose: Secondary | ICD-10-CM | POA: Diagnosis not present

## 2015-05-20 DIAGNOSIS — I129 Hypertensive chronic kidney disease with stage 1 through stage 4 chronic kidney disease, or unspecified chronic kidney disease: Secondary | ICD-10-CM | POA: Diagnosis not present

## 2015-11-11 DIAGNOSIS — I129 Hypertensive chronic kidney disease with stage 1 through stage 4 chronic kidney disease, or unspecified chronic kidney disease: Secondary | ICD-10-CM | POA: Diagnosis not present

## 2015-11-11 DIAGNOSIS — E559 Vitamin D deficiency, unspecified: Secondary | ICD-10-CM | POA: Diagnosis not present

## 2015-11-11 DIAGNOSIS — R7309 Other abnormal glucose: Secondary | ICD-10-CM | POA: Diagnosis not present

## 2015-11-18 DIAGNOSIS — N182 Chronic kidney disease, stage 2 (mild): Secondary | ICD-10-CM | POA: Diagnosis not present

## 2015-11-18 DIAGNOSIS — Z23 Encounter for immunization: Secondary | ICD-10-CM | POA: Diagnosis not present

## 2015-11-18 DIAGNOSIS — E559 Vitamin D deficiency, unspecified: Secondary | ICD-10-CM | POA: Diagnosis not present

## 2015-11-18 DIAGNOSIS — F17211 Nicotine dependence, cigarettes, in remission: Secondary | ICD-10-CM | POA: Diagnosis not present

## 2015-11-18 DIAGNOSIS — R7309 Other abnormal glucose: Secondary | ICD-10-CM | POA: Diagnosis not present

## 2015-11-18 DIAGNOSIS — I129 Hypertensive chronic kidney disease with stage 1 through stage 4 chronic kidney disease, or unspecified chronic kidney disease: Secondary | ICD-10-CM | POA: Diagnosis not present

## 2015-11-29 DIAGNOSIS — H6120 Impacted cerumen, unspecified ear: Secondary | ICD-10-CM | POA: Diagnosis not present

## 2015-12-29 DIAGNOSIS — H903 Sensorineural hearing loss, bilateral: Secondary | ICD-10-CM | POA: Diagnosis not present

## 2016-05-19 DIAGNOSIS — Z125 Encounter for screening for malignant neoplasm of prostate: Secondary | ICD-10-CM | POA: Diagnosis not present

## 2016-05-19 DIAGNOSIS — I129 Hypertensive chronic kidney disease with stage 1 through stage 4 chronic kidney disease, or unspecified chronic kidney disease: Secondary | ICD-10-CM | POA: Diagnosis not present

## 2016-05-19 DIAGNOSIS — E559 Vitamin D deficiency, unspecified: Secondary | ICD-10-CM | POA: Diagnosis not present

## 2016-05-19 DIAGNOSIS — Z1389 Encounter for screening for other disorder: Secondary | ICD-10-CM | POA: Diagnosis not present

## 2016-05-19 DIAGNOSIS — Z23 Encounter for immunization: Secondary | ICD-10-CM | POA: Diagnosis not present

## 2016-05-19 DIAGNOSIS — Z0000 Encounter for general adult medical examination without abnormal findings: Secondary | ICD-10-CM | POA: Diagnosis not present

## 2016-05-19 DIAGNOSIS — I10 Essential (primary) hypertension: Secondary | ICD-10-CM | POA: Diagnosis not present

## 2016-05-19 DIAGNOSIS — R7309 Other abnormal glucose: Secondary | ICD-10-CM | POA: Diagnosis not present

## 2016-05-30 DIAGNOSIS — N182 Chronic kidney disease, stage 2 (mild): Secondary | ICD-10-CM | POA: Diagnosis not present

## 2016-05-30 DIAGNOSIS — R7309 Other abnormal glucose: Secondary | ICD-10-CM | POA: Diagnosis not present

## 2016-05-30 DIAGNOSIS — E559 Vitamin D deficiency, unspecified: Secondary | ICD-10-CM | POA: Diagnosis not present

## 2016-05-30 DIAGNOSIS — I10 Essential (primary) hypertension: Secondary | ICD-10-CM | POA: Diagnosis not present

## 2016-11-14 DIAGNOSIS — R7309 Other abnormal glucose: Secondary | ICD-10-CM | POA: Diagnosis not present

## 2016-11-14 DIAGNOSIS — I10 Essential (primary) hypertension: Secondary | ICD-10-CM | POA: Diagnosis not present

## 2016-11-14 DIAGNOSIS — E559 Vitamin D deficiency, unspecified: Secondary | ICD-10-CM | POA: Diagnosis not present

## 2016-11-21 DIAGNOSIS — R7309 Other abnormal glucose: Secondary | ICD-10-CM | POA: Diagnosis not present

## 2016-11-21 DIAGNOSIS — E559 Vitamin D deficiency, unspecified: Secondary | ICD-10-CM | POA: Diagnosis not present

## 2016-11-21 DIAGNOSIS — N182 Chronic kidney disease, stage 2 (mild): Secondary | ICD-10-CM | POA: Diagnosis not present

## 2016-11-21 DIAGNOSIS — I10 Essential (primary) hypertension: Secondary | ICD-10-CM | POA: Diagnosis not present

## 2017-05-16 DIAGNOSIS — Z125 Encounter for screening for malignant neoplasm of prostate: Secondary | ICD-10-CM | POA: Diagnosis not present

## 2017-05-16 DIAGNOSIS — I10 Essential (primary) hypertension: Secondary | ICD-10-CM | POA: Diagnosis not present

## 2017-05-16 DIAGNOSIS — E559 Vitamin D deficiency, unspecified: Secondary | ICD-10-CM | POA: Diagnosis not present

## 2017-05-16 DIAGNOSIS — I129 Hypertensive chronic kidney disease with stage 1 through stage 4 chronic kidney disease, or unspecified chronic kidney disease: Secondary | ICD-10-CM | POA: Diagnosis not present

## 2017-05-31 DIAGNOSIS — J309 Allergic rhinitis, unspecified: Secondary | ICD-10-CM | POA: Diagnosis not present

## 2017-05-31 DIAGNOSIS — K7581 Nonalcoholic steatohepatitis (NASH): Secondary | ICD-10-CM | POA: Diagnosis not present

## 2017-05-31 DIAGNOSIS — I10 Essential (primary) hypertension: Secondary | ICD-10-CM | POA: Diagnosis not present

## 2017-05-31 DIAGNOSIS — Z6831 Body mass index (BMI) 31.0-31.9, adult: Secondary | ICD-10-CM | POA: Diagnosis not present

## 2017-05-31 DIAGNOSIS — R7309 Other abnormal glucose: Secondary | ICD-10-CM | POA: Diagnosis not present

## 2017-05-31 DIAGNOSIS — F17211 Nicotine dependence, cigarettes, in remission: Secondary | ICD-10-CM | POA: Diagnosis not present

## 2017-05-31 DIAGNOSIS — G2581 Restless legs syndrome: Secondary | ICD-10-CM | POA: Diagnosis not present

## 2017-05-31 DIAGNOSIS — N182 Chronic kidney disease, stage 2 (mild): Secondary | ICD-10-CM | POA: Diagnosis not present

## 2017-05-31 DIAGNOSIS — E559 Vitamin D deficiency, unspecified: Secondary | ICD-10-CM | POA: Diagnosis not present

## 2017-10-03 DIAGNOSIS — H9212 Otorrhea, left ear: Secondary | ICD-10-CM | POA: Diagnosis not present

## 2017-10-05 DIAGNOSIS — H6240 Otitis externa in other diseases classified elsewhere, unspecified ear: Secondary | ICD-10-CM | POA: Diagnosis not present

## 2017-10-05 DIAGNOSIS — Z974 Presence of external hearing-aid: Secondary | ICD-10-CM | POA: Diagnosis not present

## 2017-10-05 DIAGNOSIS — Z7289 Other problems related to lifestyle: Secondary | ICD-10-CM | POA: Diagnosis not present

## 2017-10-05 DIAGNOSIS — H6242 Otitis externa in other diseases classified elsewhere, left ear: Secondary | ICD-10-CM | POA: Diagnosis not present

## 2017-10-05 DIAGNOSIS — B369 Superficial mycosis, unspecified: Secondary | ICD-10-CM | POA: Diagnosis not present

## 2017-10-05 DIAGNOSIS — Z87891 Personal history of nicotine dependence: Secondary | ICD-10-CM | POA: Diagnosis not present

## 2017-10-08 DIAGNOSIS — H60332 Swimmer's ear, left ear: Secondary | ICD-10-CM | POA: Diagnosis not present

## 2017-10-19 DIAGNOSIS — B369 Superficial mycosis, unspecified: Secondary | ICD-10-CM | POA: Diagnosis not present

## 2017-10-19 DIAGNOSIS — H6240 Otitis externa in other diseases classified elsewhere, unspecified ear: Secondary | ICD-10-CM | POA: Diagnosis not present

## 2017-11-05 DIAGNOSIS — H6240 Otitis externa in other diseases classified elsewhere, unspecified ear: Secondary | ICD-10-CM | POA: Diagnosis not present

## 2017-11-05 DIAGNOSIS — B369 Superficial mycosis, unspecified: Secondary | ICD-10-CM | POA: Diagnosis not present

## 2018-04-15 DIAGNOSIS — R55 Syncope and collapse: Secondary | ICD-10-CM | POA: Diagnosis not present

## 2018-04-15 DIAGNOSIS — R05 Cough: Secondary | ICD-10-CM | POA: Diagnosis not present

## 2018-04-15 DIAGNOSIS — R42 Dizziness and giddiness: Secondary | ICD-10-CM | POA: Diagnosis not present

## 2018-04-22 DIAGNOSIS — I10 Essential (primary) hypertension: Secondary | ICD-10-CM | POA: Diagnosis not present

## 2018-04-22 DIAGNOSIS — Z125 Encounter for screening for malignant neoplasm of prostate: Secondary | ICD-10-CM | POA: Diagnosis not present

## 2018-04-22 DIAGNOSIS — Z0000 Encounter for general adult medical examination without abnormal findings: Secondary | ICD-10-CM | POA: Diagnosis not present

## 2018-04-22 DIAGNOSIS — E7800 Pure hypercholesterolemia, unspecified: Secondary | ICD-10-CM | POA: Diagnosis not present

## 2018-04-22 DIAGNOSIS — R05 Cough: Secondary | ICD-10-CM | POA: Diagnosis not present

## 2018-04-22 DIAGNOSIS — R031 Nonspecific low blood-pressure reading: Secondary | ICD-10-CM | POA: Diagnosis not present

## 2018-06-03 DIAGNOSIS — J309 Allergic rhinitis, unspecified: Secondary | ICD-10-CM | POA: Diagnosis not present

## 2018-06-03 DIAGNOSIS — I10 Essential (primary) hypertension: Secondary | ICD-10-CM | POA: Diagnosis not present

## 2018-06-03 DIAGNOSIS — Z862 Personal history of diseases of the blood and blood-forming organs and certain disorders involving the immune mechanism: Secondary | ICD-10-CM | POA: Diagnosis not present

## 2018-06-03 DIAGNOSIS — F4320 Adjustment disorder, unspecified: Secondary | ICD-10-CM | POA: Diagnosis not present

## 2018-07-31 DIAGNOSIS — E236 Other disorders of pituitary gland: Secondary | ICD-10-CM

## 2018-07-31 HISTORY — DX: Other disorders of pituitary gland: E23.6

## 2018-09-23 DIAGNOSIS — I10 Essential (primary) hypertension: Secondary | ICD-10-CM | POA: Diagnosis not present

## 2018-09-23 DIAGNOSIS — R42 Dizziness and giddiness: Secondary | ICD-10-CM | POA: Diagnosis not present

## 2018-09-30 DIAGNOSIS — R42 Dizziness and giddiness: Secondary | ICD-10-CM | POA: Diagnosis not present

## 2018-09-30 DIAGNOSIS — I10 Essential (primary) hypertension: Secondary | ICD-10-CM | POA: Diagnosis not present

## 2018-09-30 DIAGNOSIS — F4321 Adjustment disorder with depressed mood: Secondary | ICD-10-CM | POA: Diagnosis not present

## 2018-09-30 DIAGNOSIS — R634 Abnormal weight loss: Secondary | ICD-10-CM | POA: Diagnosis not present

## 2018-10-02 DIAGNOSIS — R55 Syncope and collapse: Secondary | ICD-10-CM | POA: Diagnosis not present

## 2018-10-04 DIAGNOSIS — R42 Dizziness and giddiness: Secondary | ICD-10-CM | POA: Diagnosis not present

## 2018-10-04 NOTE — Progress Notes (Deleted)
Patient referred by Janie Morning, DO for ***  Subjective:   Julian Harris, male    DOB: 1941/01/14, 78 y.o.   MRN: [id number]   No chief complaint on file.   *** HPI  78 y.o. *** male with ***  *** No past medical history on file.  *** Past Surgical History:  Procedure Laterality Date  . BILATERAL KNEE ARTHROSCOPY  2010  . CYST REMOVAL TRUNK  2010    *** Social History   Socioeconomic History  . Marital status: Legally Separated    Spouse name: Not on file  . Number of children: Not on file  . Years of education: Not on file  . Highest education level: Not on file  Occupational History  . Not on file  Social Needs  . Financial resource strain: Not on file  . Food insecurity:    Worry: Not on file    Inability: Not on file  . Transportation needs:    Medical: Not on file    Non-medical: Not on file  Tobacco Use  . Smoking status: Former Smoker    Last attempt to quit: 11/25/1973    Years since quitting: 44.8  Substance and Sexual Activity  . Alcohol use: No  . Drug use: No  . Sexual activity: Yes  Lifestyle  . Physical activity:    Days per week: Not on file    Minutes per session: Not on file  . Stress: Not on file  Relationships  . Social connections:    Talks on phone: Not on file    Gets together: Not on file    Attends religious service: Not on file    Active member of club or organization: Not on file    Attends meetings of clubs or organizations: Not on file    Relationship status: Not on file  . Intimate partner violence:    Fear of current or ex partner: Not on file    Emotionally abused: Not on file    Physically abused: Not on file    Forced sexual activity: Not on file  Other Topics Concern  . Not on file  Social History Narrative  . Not on file    *** Current Outpatient Medications on File Prior to Visit  Medication Sig Dispense Refill  . lisinopril-hydrochlorothiazide (PRINZIDE,ZESTORETIC) 20-12.5 MG per tablet Take 1  tablet by mouth daily.     No current facility-administered medications on file prior to visit.     Cardiovascular studies:  ***  2020: H/H 14/41. MCV 96. Platelets 222.   *** Recent labs:  *** CBC Latest Ref Rng & Units 01/11/2009  WBC 4.0 - 10.5 K/uL -  Hemoglobin 13.0 - 17.0 g/dL 9.0(L)  Hematocrit 39.0 - 52.0 % 25.3(L)  Platelets 150 - 400 K/uL -   CMP Latest Ref Rng & Units 01/10/2009  Glucose 70 - 99 mg/dL 116(H)  BUN 6 - 23 mg/dL 8  Creatinine 0.4 - 1.5 mg/dL 0.86  Sodium 135 - 145 mEq/L 136  Potassium 3.5 - 5.1 mEq/L 3.7  Chloride 96 - 112 mEq/L 98  CO2 19 - 32 mEq/L 32  Calcium 8.4 - 10.5 mg/dL 8.4  Total Protein 6.0 - 8.3 g/dL -  Total Bilirubin 0.3 - 1.2 mg/dL -  Alkaline Phos 39 - 117 U/L -  AST 0 - 37 U/L -  ALT 0 - 53 U/L -      Review of Systems  Constitution: Negative for decreased appetite, malaise/fatigue,  weight gain and weight loss.  HENT: Negative for congestion.   Eyes: Negative for visual disturbance.  Cardiovascular: Negative for chest pain, dyspnea on exertion, leg swelling, palpitations and syncope.  Respiratory: Negative for shortness of breath.   Endocrine: Negative for cold intolerance.  Hematologic/Lymphatic: Does not bruise/bleed easily.  Skin: Negative for itching and rash.  Musculoskeletal: Negative for myalgias.  Gastrointestinal: Negative for abdominal pain, nausea and vomiting.  Genitourinary: Negative for dysuria.  Neurological: Negative for dizziness and weakness.  Psychiatric/Behavioral: The patient is not nervous/anxious.   All other systems reviewed and are negative.       *** There were no vitals filed for this visit.  Objective:   Physical Exam  Constitutional: He appears well-developed and well-nourished. No distress.  HENT:  Head: Atraumatic.  Eyes: Conjunctivae are normal.  Neck: Neck supple. No JVD present. No thyromegaly present.  Cardiovascular: Normal rate, regular rhythm, normal heart sounds and  intact distal pulses. Exam reveals no gallop.  No murmur heard. Pulmonary/Chest: Effort normal and breath sounds normal.  Abdominal: Soft. Bowel sounds are normal.  Musculoskeletal: Normal range of motion.        General: No edema.  Neurological: He is alert.  Skin: Skin is warm and dry.  Psychiatric: He has a normal mood and affect.          Assessment & Recommendations:   ***  There are no diagnoses linked to this encounter.   Thank you for referring the patient to [place]. Please feel free to contact with any questions.  [name], MD [facility] Pager: [phone] Office: [phone] If no answer Cell [phone]

## 2018-10-07 ENCOUNTER — Emergency Department (HOSPITAL_COMMUNITY): Payer: Medicare Other

## 2018-10-07 ENCOUNTER — Other Ambulatory Visit: Payer: Self-pay

## 2018-10-07 ENCOUNTER — Encounter (HOSPITAL_COMMUNITY): Payer: Self-pay | Admitting: Emergency Medicine

## 2018-10-07 ENCOUNTER — Observation Stay (HOSPITAL_COMMUNITY)
Admission: EM | Admit: 2018-10-07 | Discharge: 2018-10-09 | Disposition: A | Discharge status: Home / Self Care | Payer: Medicare Other | Attending: Oncology | Admitting: Oncology

## 2018-10-07 ENCOUNTER — Telehealth: Payer: Self-pay

## 2018-10-07 ENCOUNTER — Ambulatory Visit: Payer: Medicare Other | Admitting: Cardiology

## 2018-10-07 DIAGNOSIS — Z96653 Presence of artificial knee joint, bilateral: Secondary | ICD-10-CM

## 2018-10-07 DIAGNOSIS — G4700 Insomnia, unspecified: Secondary | ICD-10-CM

## 2018-10-07 DIAGNOSIS — F329 Major depressive disorder, single episode, unspecified: Secondary | ICD-10-CM | POA: Insufficient documentation

## 2018-10-07 DIAGNOSIS — F4381 Prolonged grief disorder: Secondary | ICD-10-CM

## 2018-10-07 DIAGNOSIS — Z8249 Family history of ischemic heart disease and other diseases of the circulatory system: Secondary | ICD-10-CM | POA: Insufficient documentation

## 2018-10-07 DIAGNOSIS — F4321 Adjustment disorder with depressed mood: Secondary | ICD-10-CM | POA: Diagnosis not present

## 2018-10-07 DIAGNOSIS — F4320 Adjustment disorder, unspecified: Secondary | ICD-10-CM | POA: Insufficient documentation

## 2018-10-07 DIAGNOSIS — Z79899 Other long term (current) drug therapy: Secondary | ICD-10-CM | POA: Insufficient documentation

## 2018-10-07 DIAGNOSIS — I10 Essential (primary) hypertension: Secondary | ICD-10-CM | POA: Diagnosis not present

## 2018-10-07 DIAGNOSIS — R630 Anorexia: Secondary | ICD-10-CM | POA: Insufficient documentation

## 2018-10-07 DIAGNOSIS — J9811 Atelectasis: Secondary | ICD-10-CM | POA: Diagnosis not present

## 2018-10-07 DIAGNOSIS — R42 Dizziness and giddiness: Secondary | ICD-10-CM | POA: Diagnosis not present

## 2018-10-07 DIAGNOSIS — R55 Syncope and collapse: Principal | ICD-10-CM | POA: Insufficient documentation

## 2018-10-07 DIAGNOSIS — F4329 Adjustment disorder with other symptoms: Secondary | ICD-10-CM

## 2018-10-07 LAB — CBC
HCT: 41.4 % (ref 39.0–52.0)
Hemoglobin: 13.9 g/dL (ref 13.0–17.0)
MCH: 32.4 pg (ref 26.0–34.0)
MCHC: 33.6 g/dL (ref 30.0–36.0)
MCV: 96.5 fL (ref 80.0–100.0)
Platelets: 231 10*3/uL (ref 150–400)
RBC: 4.29 MIL/uL (ref 4.22–5.81)
RDW: 13.2 % (ref 11.5–15.5)
WBC: 8.1 10*3/uL (ref 4.0–10.5)
nRBC: 0 % (ref 0.0–0.2)

## 2018-10-07 LAB — BASIC METABOLIC PANEL
Anion gap: 8 (ref 5–15)
BUN: 11 mg/dL (ref 8–23)
CO2: 23 mmol/L (ref 22–32)
Calcium: 9.3 mg/dL (ref 8.9–10.3)
Chloride: 106 mmol/L (ref 98–111)
Creatinine, Ser: 1.22 mg/dL (ref 0.61–1.24)
GFR calc Af Amer: >60 mL/min (ref >60)
GFR calc non Af Amer: 56 mL/min — ABNORMAL LOW (ref >60)
Glucose, Bld: 117 mg/dL — ABNORMAL HIGH (ref 70–99)
Potassium: 3.5 mmol/L (ref 3.5–5.1)
Sodium: 137 mmol/L (ref 135–145)

## 2018-10-07 LAB — I-STAT TROPONIN, ED: Troponin i, poc: 0.01 ng/mL (ref 0.00–0.08)

## 2018-10-07 LAB — URINALYSIS, ROUTINE W REFLEX MICROSCOPIC
Bilirubin Urine: NEGATIVE
Glucose, UA: NEGATIVE mg/dL
Hgb urine dipstick: NEGATIVE
Ketones, ur: NEGATIVE mg/dL
Leukocytes,Ua: NEGATIVE
Nitrite: NEGATIVE
Protein, ur: NEGATIVE mg/dL
Specific Gravity, Urine: 1.012 (ref 1.005–1.030)
pH: 6 (ref 5.0–8.0)

## 2018-10-07 LAB — D-DIMER, QUANTITATIVE: D-Dimer, Quant: 0.54 ug/mL-FEU — ABNORMAL HIGH (ref 0.00–0.50)

## 2018-10-07 MED ORDER — SODIUM CHLORIDE 0.9% FLUSH: 3.0000 mL | Freq: Once | INTRAVENOUS | Status: DC

## 2018-10-07 MED ORDER — SODIUM CHLORIDE 0.9 % IV BOLUS
1000.0000 mL | Freq: Once | INTRAVENOUS | Status: AC
  Administered 2018-10-07: 1000 mL via INTRAVENOUS

## 2018-10-07 MED ORDER — ENOXAPARIN SODIUM 40 MG/0.4ML ~~LOC~~ SOLN
40.0000 mg | SUBCUTANEOUS | Status: DC
  Administered 2018-10-08 – 2018-10-09 (×2): 40 mg via SUBCUTANEOUS
  Filled 2018-10-07 (×2): qty 0.4

## 2018-10-07 NOTE — ED Triage Notes (Addendum)
Pt in via POV with multiple episodes of syncope. Passed out in triage, taken back to room. Denies any cp around these events. States he has been increasingly lightheaded x the past wk

## 2018-10-07 NOTE — Telephone Encounter (Signed)
10/07/18 patient checked in as a new patient with Dr Virgina Jock. Patient fainted on parking lot per son Treyshaun Keatts). Patient was sent to ER per [facility] nurse  Cheri Kearns).

## 2018-10-07 NOTE — ED Provider Notes (Signed)
[facility] EMERGENCY DEPARTMENT Provider Note   CSN: [id number] Arrival date & time: 10/07/18  1302    History   Chief Complaint Chief Complaint  Patient presents with  . Loss of Consciousness    HPI Julian Harris is a 78 y.o. male.     Patient is a 78 year old male with a history of hypertension coming in today for multiple episodes of syncope.  Patient states that for the last week he has had episodes of feeling lightheaded.  Usually it is while standing he will have a brief wave of feeling lightheaded that then passes.  He has been following up with his PCP for this and recently has stopped all blood pressure medications a few days ago because of the sensations.  He had a head CT last week without acute findings.  He denies any chest pain or shortness of breath associated with the symptoms of feeling dizzy.  However he does state that recently he has been under a severe amount of stress after his son committed suicide.  He has had difficulty sleeping has decreased appetite and just has lack of desire to want a do a lot of things.  He states when he does go out and do activity he does feel winded but it resolves with rest.  He denies any unilateral leg pain or swelling.  He does not take any anticoagulation.  Patient has worn a cardiac monitor with just the episodes of dizziness that he has not heard back on yet but had a normal EKG.  Today he was going to see the cardiologist for the symptoms of dizziness but his son states while he was in the car on the way there he had a syncopal event where his face started to droop a little he became very white and sweaty and lost consciousness for a few seconds and then came back and was fine.  While he was in the cardiology office he had a second event that did not last more than a few minutes and then a third event after he arrived to the emergency room.  When he got to the room he was awake and states he feels okay now.  He states he  can feel it coming on and right before it happens he starts feeling very lightheaded.  All the episodes occurred while he was sitting today.  The history is provided by the patient.  Loss of Consciousness  Episode history:  Multiple Most recent episode:  Today Timing:  Intermittent Progression:  Resolved Chronicity:  New Context: sitting down   Witnessed: yes   Relieved by:  Nothing Worsened by:  Nothing Ineffective treatments:  None tried Associated symptoms: diaphoresis   Associated symptoms: no chest pain, no difficulty breathing, no fever, no focal weakness, no headaches, no nausea, no palpitations, no recent injury, no shortness of breath, no vomiting and no weakness     History reviewed. No pertinent past medical history.  Patient Active Problem List   Diagnosis Date Noted  . Epidermal cyst of neck 11/25/2013    Past Surgical History:  Procedure Laterality Date  . BILATERAL KNEE ARTHROSCOPY  2010  . CYST REMOVAL TRUNK  2010        Home Medications    Prior to Admission medications   Medication Sig Start Date End Date Taking? Authorizing Provider  lisinopril-hydrochlorothiazide (PRINZIDE,ZESTORETIC) 20-12.5 MG per tablet Take 1 tablet by mouth daily.    [provider]    Family History No  family history on file.  Social History Social History   Tobacco Use  . Smoking status: Former Smoker    Last attempt to quit: 11/25/1973    Years since quitting: 44.8  . Smokeless tobacco: Never Used  Substance Use Topics  . Alcohol use: Yes    Comment: occasional  . Drug use: No     Allergies   Patient has no known allergies.   Review of Systems Review of Systems  Constitutional: Positive for diaphoresis. Negative for fever.  Respiratory: Negative for shortness of breath.   Cardiovascular: Positive for syncope. Negative for chest pain and palpitations.  Gastrointestinal: Negative for nausea and vomiting.  Neurological: Negative for focal weakness,  weakness and headaches.  All other systems reviewed and are negative.    Physical Exam Updated Vital Signs Wt 96.6 kg   BMI 31.45 kg/m   Physical Exam Vitals signs and nursing note reviewed.  Constitutional:      General: He is not in acute distress.    Appearance: He is well-developed.  HENT:     Head: Normocephalic and atraumatic.  Eyes:     Conjunctiva/sclera: Conjunctivae normal.     Pupils: Pupils are equal, round, and reactive to light.  Neck:     Musculoskeletal: Normal range of motion and neck supple.  Cardiovascular:     Rate and Rhythm: Normal rate and regular rhythm.     Heart sounds: No murmur.  Pulmonary:     Effort: Pulmonary effort is normal. No respiratory distress.     Breath sounds: Normal breath sounds. No wheezing or rales.  Abdominal:     General: There is no distension.     Palpations: Abdomen is soft.     Tenderness: There is no abdominal tenderness. There is no guarding or rebound.  Musculoskeletal: Normal range of motion.        General: No tenderness.     Right lower leg: No edema.     Left lower leg: No edema.  Skin:    General: Skin is warm and dry.     Coloration: Skin is pale.     Findings: No erythema or rash.  Neurological:     Mental Status: He is alert and oriented to person, place, and time.  Psychiatric:        Behavior: Behavior normal.      ED Treatments / Results  Labs (all labs ordered are listed, but only abnormal results are displayed) Labs Reviewed  BASIC METABOLIC PANEL - Abnormal; Notable for the following components:      Result Value   Glucose, Bld 117 (*)    GFR calc non Af Amer 56 (*)    All other components within normal limits  D-DIMER, QUANTITATIVE (NOT AT [facility]) - Abnormal; Notable for the following components:   D-Dimer, Quant 0.54 (*)    All other components within normal limits  CBC  URINALYSIS, ROUTINE W REFLEX MICROSCOPIC  I-STAT TROPONIN, ED  CBG MONITORING, ED    EKG EKG  Interpretation  Date/Time:  Monday October 07 2018 13:24:20 EDT Ventricular Rate:  69 PR Interval:    QRS Duration: 95 QT Interval:  440 QTC Calculation: 472 R Axis:   48 Text Interpretation:  Age not entered, assumed to be  78 years old for purpose of ECG interpretation Sinus rhythm Low voltage, precordial leads Borderline T wave abnormalities No significant change since last tracing Confirmed by Blanchie Dessert ([id number]) on 10/07/2018 1:43:33 PM   Radiology Dg Chest [facility]  1 View  Result Date: 10/07/2018 CLINICAL DATA:  Syncopal episode EXAM: PORTABLE CHEST 1 VIEW COMPARISON:  04/28/2008 FINDINGS: Cardiac shadow is enlarged but accentuated by the portable technique. The overall inspiratory effort is poor with mild bibasilar atelectasis. Old rib fractures are noted on the right. No acute bony abnormality is seen. IMPRESSION: Poor inspiratory effort with bibasilar atelectasis. Electronically Signed   By: Inez Catalina M.D.   On: 10/07/2018 14:20    Procedures Procedures (including critical care time)  Medications Ordered in ED Medications  sodium chloride flush (NS) 0.9 % injection 3 mL (3 mLs Intravenous Not Given 10/07/18 1331)     Initial Impression / Assessment and Plan / ED Course  I have reviewed the triage vital signs and the nursing notes.  Pertinent labs & imaging results that were available during my care of the patient were reviewed by me and considered in my medical decision making (see chart for details).       Elderly male presenting today with now 3 episodes of syncope today.  Concern for anemia versus dehydration versus acute kidney injury versus dysrhythmia versus PE as the cause of his syncope.  Patient for the last week or so has been feeling lightheaded usually will when in a standing position but today syncope happened while seated.  He takes no anticoagulation has no known heart history.  Lower suspicion that it is related to medications as his PCP stopped his  medication within the last week because of his feelings of lightheadedness.  Patient has worn a cardiac monitor prior to the events of syncope and was waiting for the results but had had a normal EKG and head CT.  Low suspicion for dissection, stroke or neurologic cause.  EKG without acute findings.  Labs and chest x-ray pending.  2:58 PM Patient's labs are reassuring.  D-dimer is mildly elevated at 0.54 however when age-adjusted it is normal.  Patient's creatinine is 1.22 but last to compare was in 2012.  Troponin is within normal limits.  Patient was given IV fluids.  This may be related to dehydration, having symptoms of depression from all the stress in his life most recently however given 3 syncopal events today recommend observation overnight to ensure no further episodes.  Final Clinical Impressions(s) / ED Diagnoses   Final diagnoses:  Syncope and collapse    ED Discharge Orders    None       Blanchie Dessert, MD 10/07/18 1502

## 2018-10-07 NOTE — Progress Notes (Signed)
Patient was sent to the ED given brief episode of facial drop and questionable loss of consciousness. Not witnessed personally by me.

## 2018-10-07 NOTE — H&P (Signed)
Date: 10/07/2018               Patient Name:  Julian Harris MRN: [id number]  DOB: 28-Aug-1940 Age / Sex: 78 y.o., male   PCP: Julian Morning, DO         Medical Service: Internal Medicine Teaching Service         Attending Physician: Dr. Annia Belt, MD    First Contact: Dr. Annie Harris Pager: [phone]  Second Contact: Dr. Berline Harris Pager: [phone]       After Hours (After 5p/  First Contact Pager: [phone]  weekends / holidays): Second Contact Pager: [phone]   Chief Complaint: syncope  History of Present Illness: Julian Harris is a 78 yo male with a medical history of HTN who presented to the ED after experiencing three episodes of syncope today. The first episode occurred this Harris when his Julian Harris was driving him to a cardiologist appointment. He states that he was sitting in the car and began to feel lightheaded and sweaty. He then remembers his Julian Harris trying to wake him up. His Julian Harris reports that this Harris he appeared groggy and all of a sudden passed out. The Julian Harris had to call his name and pinch him to wake him up. The episode lasted about one minute. His Julian Harris states it took about 10-15 minutes for him to seem normal after that. His Julian Harris secured him a wheelchair at the doctor's office because he was feeling weak. The next episode occurred while he was sitting in the cardiology waiting room and was very similar to the first. The third episode occurred while he was checking into the ED. At that time he had defibrillator pads applied to his chest and was moved to a room. Each episode of unconsciousness was brief and occurred when he was in a sitting position. He denies chest pain, palpitations, or dyspnea with any of these episodes. The patient once experienced a similar episode of syncope last August when he was outside working on his car. His neighbor nurse told him he looked pale and had to hold him up when he fainted. Since August, he has had episodes of lightheadedness that primarily occur in  the Harris after he walks his dog. The episodes of lightheadedness used to occur infrequently, but in the past two weeks they have been happening more often. He does not believe they are associated with exertion.  He has seen his PCP Julian Harris for the lightheadedness in the past few weeks. She discontinued his lisinopril (he was taking 5mg  daily), got a head CT (which was normal), an ECHO, and blood work. All was reportedly normal per patient. He has also worn a heart monitor recently. He did experience lightheaded spells while wearing this, but no syncopal episodes. He has not gotten the results back from this yet. He was scheduled to see a cardiologist earlier today before coming to the ED.   Of note, Julian Harris endorses loss of appetite, weight loss (although this has been somewhat intentional), nausea, insomnia, and anhedonia since he lost his Julian Harris traumatically in October. He has been much less active recently and has been "down." He also states that he doesn't drink a lot of fluids. His Julian Harris says he is drastically different since his other Julian Harris passed away. He has not seen a therapist or grief counselor.  Upon arrival to the ED, the patient was afebrile and hypertensive to the 631S systolic. CBC and BMP unremarkable. Troponin negative. D-dimer 0.54, but normal  when age adjusted. UA normal. EKG NSR with low voltage and no signs of ischemia. CXR with bibasilar atelectasis and no edema or opacities. He received 1L NS in the ED.  Meds: Current Meds  Medication Sig  . Cholecalciferol (VITAMIN D3) 125 MCG (5000 UT) CAPS Take 5,000 Units by mouth daily after breakfast.   . Cyanocobalamin (VITAMIN B-12) 2500 MCG SUBL Place 2,500 mcg under the tongue daily after breakfast.   . vitamin C (ASCORBIC ACID) 500 MG tablet Take 500-1,000 mg by mouth daily after breakfast.    Allergies: Allergies as of 10/07/2018  . (No Known Allergies)   History reviewed. No pertinent past medical history. Past Surgical  History:  Procedure Laterality Date  . BILATERAL KNEE ARTHROSCOPY  2010  . CYST REMOVAL TRUNK  2010    Family History: Father with CAD. Granddaughter with DMI.  Social History: Originally from [place]. Retired and moved to [place] to take care of his granddaughter. He lives alone now and is independent in all of his activities. Never smoker. Infrequent etoh use. Denies illicit drug use.   Review of Systems: A complete ROS was negative except as per HPI.   Physical Exam: Blood pressure (!) 126/95, pulse 76, temperature (!) 97.4 F (36.3 C), temperature source Oral, resp. rate 17, weight 95.3 kg, SpO2 98 %.  Constitutional: Obese male in no distress. Eyes: Pupils are equal, round, and reactive to light. EOM are normal.  Cardiovascular: Normal rate and regular rhythm. No murmurs, rubs, or gallops. Pulmonary/Chest: Effort normal. Clear to auscultation bilaterally. No wheezes, rales, or rhonchi. Abdominal: Bowel sounds present. Obese, soft, nontender. Ext: No lower extremity edema. Skin: Warm and dry. No rashes or wounds. Neuro: Alert and oriented x3. Normal strength in all 4 extremities.   EKG: personally reviewed my interpretation is NSR with low voltage. No signs of ischemia.  CXR: personally reviewed my interpretation is bibasilar atelectasis without edema or opacities.  Assessment & Plan by Problem: Active Problems:   Syncope  Julian Harris is a 78 yo man with a medical history of HTN and recent grief after losing his Julian Harris who presented with three episodes of syncope. The episodes are preceded by lightheadedness and diaphoresis, but no chest pain, dyspnea, or palpitations, and last about one minute. He was admitted for further evaluation and management.  Syncope - Outpatient workup has included head CT, ECHO, and cardiac monitor. The CT and ECHO were normal. The cardiac monitor results are pending. - DDx includes cardiac arrhythmia, dehydration, vasovagal episodes, poor  nutritional status, and deconditioning. The episodes could also be due to aortic stenosis or vascular disease, however the episodes are not exertional. No concern for seizures given his lack of seizure history and postictal state as well as the description of the events.  Plan - Tele - Orthostatics - ECHO  Grief - Patient meets criteria for depression with insomnia, poor energy, loss of appetite, anhedonia, and feelings of guilt - I do believe his grief may be playing a role in his symptoms as he has not been taking good care of himself recently Plan - Consider outpatient grief counseling  - Discuss risks vs benefits of pharmacotherapy with an SSRI with the patient   HTN  - Patient currently hypertensive to the 643P systolic. He recently discontinued his lisinopril due to lightheadedness.  Plan - Continue to monitor  FEN: no IV fluids, heart healthy diet, replace electrolytes as needed  DVT ppx: Lovenox Code status: FULL code  Dispo: Admit patient to  Observation with expected length of stay less than 2 midnights.  Signed: Corinne Ports, MD 10/07/2018, 5:55 PM  Pager: [phone]

## 2018-10-07 NOTE — ED Notes (Signed)
zoll pads placed.

## 2018-10-07 NOTE — ED Notes (Addendum)
ED TO INPATIENT HANDOFF REPORT  ED Nurse Name and Phone #: [phone]  S Name/Age/Gender Julian Harris 78 y.o. male Room/Bed: 019C/019C  Code Status   Code Status: Full Code  Home/SNF/Other Home Patient oriented to: self, place, time and situation Is this baseline? Yes   Triage Complete: Triage complete  Chief Complaint syncope  Triage Note Pt in via POV with multiple episodes of syncope. Passed out in triage, taken back to room. Denies any cp around these events. States he has been increasingly lightheaded x the past wk   Allergies No Known Allergies  Level of Care/Admitting Diagnosis ED Disposition    ED Disposition Condition [place]: [place] [100100]  Level of Care: Cardiac Telemetry [103]  Diagnosis: Syncope [373428]  Admitting Physician: Annia Belt [[id number]]  Attending Physician: Annia Belt [[id number]]  PT Class (Do Not Modify): Observation [104]  PT Acc Code (Do Not Modify): Observation [10022]       B Medical/Surgery History History reviewed. No pertinent past medical history. Past Surgical History:  Procedure Laterality Date  . BILATERAL KNEE ARTHROSCOPY  2010  . CYST REMOVAL TRUNK  2010     A IV Location/Drains/Wounds Patient Lines/Drains/Airways Status   Active Line/Drains/Airways    Name:   Placement date:   Placement time:   Site:   Days:   Peripheral IV 10/07/18 Right Antecubital   10/07/18    1404    Antecubital   less than 1          Intake/Output Last 24 hours  Intake/Output Summary (Last 24 hours) at 10/07/2018 1638 Last data filed at 10/07/2018 1634 Gross per 24 hour  Intake 1000 ml  Output 100 ml  Net 900 ml    Labs/Imaging Results for orders placed or performed during the hospital encounter of 10/07/18 (from the past 48 hour(s))  Basic metabolic panel     Status: Abnormal   Collection Time: 10/07/18  1:34 PM  Result Value Ref Range   Sodium 137 135 - 145 mmol/L   Potassium 3.5 3.5 - 5.1 mmol/L   Chloride 106 98 - 111 mmol/L   CO2 23 22 - 32 mmol/L   Glucose, Bld 117 (H) 70 - 99 mg/dL   BUN 11 8 - 23 mg/dL   Creatinine, Ser 1.22 0.61 - 1.24 mg/dL   Calcium 9.3 8.9 - 10.3 mg/dL   GFR calc non Af Amer 56 (L) >60 mL/min   GFR calc Af Amer >60 >60 mL/min   Anion gap 8 5 - 15    Comment: Performed at [facility], [place] [address], [place] [address]  CBC     Status: None   Collection Time: 10/07/18  1:34 PM  Result Value Ref Range   WBC 8.1 4.0 - 10.5 K/uL   RBC 4.29 4.22 - 5.81 MIL/uL   Hemoglobin 13.9 13.0 - 17.0 g/dL   HCT 41.4 39.0 - 52.0 %   MCV 96.5 80.0 - 100.0 fL   MCH 32.4 26.0 - 34.0 pg   MCHC 33.6 30.0 - 36.0 g/dL   RDW 13.2 11.5 - 15.5 %   Platelets 231 150 - 400 K/uL   nRBC 0.0 0.0 - 0.2 %    Comment: Performed at [facility], [place] [address], [place], [place] [address]  D-dimer, quantitative (not at [facility])     Status: Abnormal   Collection Time: 10/07/18  1:43 PM  Result Value  Ref Range   D-Dimer, Quant 0.54 (H) 0.00 - 0.50 ug/mL-FEU    Comment: (NOTE) At the manufacturer cut-off of 0.50 ug/mL FEU, this assay has been documented to exclude PE with a sensitivity and negative predictive value of 97 to 99%.  At this time, this assay has not been approved by the FDA to exclude DVT/VTE. Results should be correlated with clinical presentation. Performed at [facility], [place] [address] [place], [place] [address]   I-stat troponin, ED     Status: None   Collection Time: 10/07/18  1:49 PM  Result Value Ref Range   Troponin i, poc 0.01 0.00 - 0.08 ng/mL   Comment 3            Comment: Due to the release kinetics of cTnI, a negative result within the first hours of the onset of symptoms does not rule out myocardial infarction with certainty. If myocardial infarction is still suspected, repeat the test at appropriate intervals.   Urinalysis, Routine w reflex microscopic     Status: None   Collection  Time: 10/07/18  3:52 PM  Result Value Ref Range   Color, Urine YELLOW YELLOW   APPearance CLEAR CLEAR   Specific Gravity, Urine 1.012 1.005 - 1.030   pH 6.0 5.0 - 8.0   Glucose, UA NEGATIVE NEGATIVE mg/dL   Hgb urine dipstick NEGATIVE NEGATIVE   Bilirubin Urine NEGATIVE NEGATIVE   Ketones, ur NEGATIVE NEGATIVE mg/dL   Protein, ur NEGATIVE NEGATIVE mg/dL   Nitrite NEGATIVE NEGATIVE   Leukocytes,Ua NEGATIVE NEGATIVE    Comment: Performed at [facility] [address] [place], [place] [address]   Dg Chest Port 1 View  Result Date: 10/07/2018 CLINICAL DATA:  Syncopal episode EXAM: PORTABLE CHEST 1 VIEW COMPARISON:  04/28/2008 FINDINGS: Cardiac shadow is enlarged but accentuated by the portable technique. The overall inspiratory effort is poor with mild bibasilar atelectasis. Old rib fractures are noted on the right. No acute bony abnormality is seen. IMPRESSION: Poor inspiratory effort with bibasilar atelectasis. Electronically Signed   By: Inez Catalina M.D.   On: 10/07/2018 14:20    Pending Labs Unresulted Labs (From admission, onward)   None      Vitals/Pain Today's Vitals   10/07/18 1503 10/07/18 1515 10/07/18 1550 10/07/18 1600  BP: (!) 144/100 (!) 157/95  (!) 150/104  Pulse: 65 64  69  Resp: 15 16  12   SpO2: 98% 100%  100%  Weight:      PainSc:   0-No pain     Isolation Precautions No active isolations  Medications Medications  sodium chloride flush (NS) 0.9 % injection 3 mL (3 mLs Intravenous Not Given 10/07/18 1331)  enoxaparin (LOVENOX) injection 40 mg (has no administration in time range)  sodium chloride 0.9 % bolus 1,000 mL (0 mLs Intravenous Stopped 10/07/18 1634)    Mobility walks Moderate fall risk   Focused Assessments Cardiac Assessment Handoff:  Cardiac Rhythm: Normal sinus rhythm No results found for: CKTOTAL, CKMB, CKMBINDEX, TROPONINI Lab Results  Component Value Date   DDIMER 0.54 (H) 10/07/2018   Does the Patient currently have chest  pain? No     R Recommendations: See Admitting Provider Note  Report given to:   Additional Notes:  Patient HOH - look at patient when you talk

## 2018-10-08 ENCOUNTER — Other Ambulatory Visit: Payer: Self-pay

## 2018-10-08 ENCOUNTER — Observation Stay (HOSPITAL_BASED_OUTPATIENT_CLINIC_OR_DEPARTMENT_OTHER): Payer: Medicare Other

## 2018-10-08 DIAGNOSIS — R55 Syncope and collapse: Secondary | ICD-10-CM

## 2018-10-08 DIAGNOSIS — Z79899 Other long term (current) drug therapy: Secondary | ICD-10-CM | POA: Diagnosis not present

## 2018-10-08 DIAGNOSIS — F4321 Adjustment disorder with depressed mood: Secondary | ICD-10-CM | POA: Diagnosis not present

## 2018-10-08 DIAGNOSIS — I10 Essential (primary) hypertension: Secondary | ICD-10-CM | POA: Diagnosis not present

## 2018-10-08 LAB — ECHOCARDIOGRAM COMPLETE
Height: 69 in
Weight: 3364.8 oz

## 2018-10-08 LAB — GLUCOSE, CAPILLARY
Glucose-Capillary: 95 mg/dL (ref 70–99)
Glucose-Capillary: 94 mg/dL (ref 70–99)

## 2018-10-08 MED ORDER — SODIUM CHLORIDE 0.9 % IV BOLUS
500.0000 mL | Freq: Once | INTRAVENOUS | Status: AC
  Administered 2018-10-08: 500 mL via INTRAVENOUS

## 2018-10-08 NOTE — TOC Initial Note (Signed)
Transition of Care (TOC) - Initial/Assessment Note    Patient Details  Name: Julian Harris MRN: [id number] Date of Birth: 08/26/1940  Transition of Care (TOC) CM/SW Contact:    Sarah C Boswell, LCSW Phone Number: 10/08/2018, 11:30 AM  Clinical Narrative:  CSW met with patient. No supports at bedside. Patient's PCP is Dana Collins at [facility]. Pharmacy is [facility] on [address]. Patient still drives and has no trouble getting to appointments or picking up medications. [place] Patient uses no equipment at home and does not anticipate needing any once medically stable for discharge. No further concerns. CSW encouraged patient to contact CSW as needed. CSW will continue to follow progress.                 Expected Discharge Plan: Home/Self Care Barriers to Discharge: Continued Medical Work up   Patient Goals and CMS Choice        Expected Discharge Plan and Services Expected Discharge Plan: Home/Self Care   Post Acute Care Choice: NA Living arrangements for the past 2 months: Single Family Home                          Prior Living Arrangements/Services Living arrangements for the past 2 months: Single Family Home Lives with:: Self Patient language and need for interpreter reviewed:: No Do you feel safe going back to the place where you live?: Yes      Need for Family Participation in Patient Care: No (Comment) Care giver support system in place?: No (comment) Current home services: Other (comment)(None) Criminal Activity/Legal Involvement Pertinent to Current Situation/Hospitalization: No - Comment as needed  Activities of Daily Living Home Assistive Devices/Equipment: Eyeglasses, Blood pressure cuff ADL Screening (condition at time of admission) Patient's cognitive ability adequate to safely complete daily activities?: Yes Is the patient deaf or have difficulty hearing?: Yes Does the patient have difficulty  seeing, even when wearing glasses/contacts?: No Does the patient have difficulty concentrating, remembering, or making decisions?: No Patient able to express need for assistance with ADLs?: Yes Does the patient have difficulty dressing or bathing?: No Independently performs ADLs?: Yes (appropriate for developmental age) Does the patient have difficulty walking or climbing stairs?: Yes Weakness of Legs: None Weakness of Arms/Hands: None  Permission Sought/Granted   Permission granted to share information with : No              Emotional Assessment Appearance:: Appears stated age Attitude/Demeanor/Rapport: Engaged, Gracious Affect (typically observed): Accepting, Appropriate, Calm, Pleasant Orientation: : Oriented to Self, Oriented to Place, Oriented to  Time, Oriented to Situation Alcohol / Substance Use: Never Used Psych Involvement: No (comment)  Admission diagnosis:  Syncope and collapse [R55] Patient Active Problem List   Diagnosis Date Noted  . Syncope 10/07/2018  . HTN (hypertension), benign   . Epidermal cyst of neck 11/25/2013   PCP:  Collins, Dana, DO Pharmacy:   [id number] - [place] ([place]), [place] - [address] [place] ([place]) [place] [address] Phone: [phone] Fax: [phone]     Social Determinants of Health (SDOH) Interventions    Readmission Risk Interventions  No flowsheet data found.  

## 2018-10-08 NOTE — Progress Notes (Addendum)
   Subjective: Julian Harris did not sleep well last night, but otherwise is feeling ok. He didn't have any episodes of syncope or lightheadedness overnight. Per nursing, this morning while he was lying in bed, he did have an episode of lightheadedness. His BP during these episodes decreased to 82/64. He felt better after a few minutes and his BP increased to 119/93. He is very concerned about these episodes. Discussed plan for echo today.  Spoke with Dr. Theda Sers, the patient's PCP, via telephone. She reports that he hasn't had an ECHO. She gave me the results of his Zio patch. He was NSR except for 3 runs of SVT with a maximum of 10 beats. Max heart rate was 168 during one of the SVT runs. He was only symptomatic with lightheadedness while in NSR. She states he was not orthostatic when checked in her office. Dr. Theda Sers agrees that stress is likely playing a role in his symptoms. She would like to initiate pharmacotherapy as an outpatient.   Objective:  Vital signs in last 24 hours: Vitals:   10/07/18 1945 10/08/18 0027 10/08/18 0440 10/08/18 0443  BP:  117/82 (!) 146/95   Pulse:  73 69   Resp:  20 17   Temp:  97.7 F (36.5 C) 98.7 F (37.1 C)   TempSrc:  Oral Oral   SpO2:  92% 98%   Weight:    95.4 kg  Height: 5\' 9"  (1.753 m)      General: awake, alert, sitting up in bed in NAD CV: RRR; no murmurs, rubs or gallops Pulm: normal effort; lungs CTAB Ext: no edema   Assessment/Plan:  Active Problems:   Syncope   HTN (hypertension), benign  Julian Harris is a 78 yo man with a medical history of HTN and recent grief after losing his son who presented with three episodes of syncope. The episodes are preceded by lightheadedness and diaphoresis, but no chest pain, dyspnea, or palpitations, and last about one minute. He was admitted for further evaluation and management.  Syncope - DDx includes structural cardiac defect, dehydration, vasovagal episodes, poor nutritional status, deconditioning,  and grief/stress reaction. Unlikely to be cardiac arrhythmia given his Zio patch results. Unlikely to be hypoglycemia as his fasting blood sugar was 95. Because the episodes are marked by hypotension and do not occur with exertion, consider carotid baroreceptor dysfunction with turning his head. Unclear whether the episodes are exacerbated with head turning.  - Patient was orthostatic yesterday dropping from 116/81 lying down to 84/63 standing at three minutes. He may benefit from compression stockings for vascular insufficiency. He has no lower extremity edema. Will likely discharge today if ECHO and carotid dopplers are normal. Plan - Tele - Give 570ml NS fluid bolus and then repeat orthostatics - ECHO - Carotid dopplers  Grief - I do believe his grief may be playing a role in his symptoms as he has not been taking good care of himself recently Plan - Defer initiating antidepressant pharmacotherapy to his PCP Dr. Theda Sers  Dispo: Anticipated discharge approximately 0-1 days.  , Julian Elk, MD 10/08/2018, 6:10 AM Pager: [phone]

## 2018-10-08 NOTE — Progress Notes (Signed)
At [place] this am patient called c/o lightheaded, diaphoretic and verbalized he is about to pass out  v/s checked (see flow sheet) MD is notified 500 ML NS bolus given per md order.Pt. is doing fine v/s is stable so far. Will continue to monitor the patient.

## 2018-10-08 NOTE — Progress Notes (Signed)
  Echocardiogram 2D Echocardiogram has been performed.  Julian Harris 10/08/2018, 3:23 PM

## 2018-10-09 DIAGNOSIS — F4329 Adjustment disorder with other symptoms: Secondary | ICD-10-CM

## 2018-10-09 DIAGNOSIS — Z79899 Other long term (current) drug therapy: Secondary | ICD-10-CM | POA: Diagnosis not present

## 2018-10-09 DIAGNOSIS — F4321 Adjustment disorder with depressed mood: Secondary | ICD-10-CM

## 2018-10-09 DIAGNOSIS — F4381 Prolonged grief disorder: Secondary | ICD-10-CM

## 2018-10-09 DIAGNOSIS — I10 Essential (primary) hypertension: Secondary | ICD-10-CM | POA: Diagnosis not present

## 2018-10-09 DIAGNOSIS — R55 Syncope and collapse: Secondary | ICD-10-CM | POA: Diagnosis not present

## 2018-10-09 NOTE — Care Management Obs Status (Signed)
[place] NOTIFICATION   Patient Details  Name: Julian Harris MRN: [id number] Date of Birth: 01/13/1941   Medicare Observation Status Notification Given:  Yes    Royston Bake, RN/ Observation letter was given [place] Scrape CSW 10/09/2018, 9:42 AM

## 2018-10-09 NOTE — Progress Notes (Signed)
   Subjective:   No episodes of lightheadedness overnight. Last BP of the right arm was 126/84. Several readings were taken overnight of around 147/110 were of the left arm. Patient has no acute concerns today. All of his questions were answered.  Objective:  Vital signs in last 24 hours: Vitals:   10/09/18 0045 10/09/18 0100 10/09/18 0235 10/09/18 0238  BP:  (!) 147/110 (!) 144/100 126/84  Pulse:      Resp:      Temp:      TempSrc:      SpO2:      Weight: 95 kg     Height:       General: well-nourished. Awake, alert and sitting in bed in no acute distress CV: RRR, no murmurs, rubs or gallops Pulm: Normal work of breathing. Lungs CTAB Ext: No peripheral edema  Assessment/Plan:  Active Problems:   Syncope   HTN (hypertension), benign  Julian Harris is a 78 yo man with a medical history of HTN and recent grief after losing his son who presented with three episodes of syncope. The episodes are preceded by lightheadedness and diaphoresis, but no chest pain, dyspnea, or palpitations, and last about one minute. He was admitted for further evaluation and management to r/o cardiac etiology.  Syncope - DDx includesstructural cardiac defect, dehydration, vasovagal episodes, poor nutritional status, carotid sinus hypersensitivity, deconditioning, and grief/stress reaction.Initially, there was some orthostatic hypotension, but after hydrating, repeat orthostatics were negative. ECHO with EF >64% and mild diastolic dysfunction. Carotid dopplers showed 1-39% stenosis in the bilateral carotid arteries and normal flow in the bilateral subclavians. Cardiac workup does not show arrhythmia or structural abnormality to explain these episodes. Because the episodes are marked by hypotension and do not occur with exertion, may consider carotid sinus hypersensitivity or carotid sinus syndrome. Symptoms were not reproduced by brief carotid sinus massage. It is likely that these episodes may be a combination  of orthostatic hypotension, vasovagal response, carotid sinus hypersensitivity, and grief/stress. Patient is medically stable for discharge home today.  - CT head was normal, but if episodes continue, may consider brain MRI in the future to assess for small lesions.not visualized - Advised to stay hydrated, eat well and practice good self-care. Taking some time before standing up from sitting position. - Will follow with PCP for further monitoring and outpatient workup.  Grief -Defer initiating antidepressant pharmacotherapy to his PCP Dr. Theda Sers  Dispo: Anticipated discharge today  Presley Raddle, Medical Student 10/09/2018, 10:00 AM  Attestation for Student Documentation:  I personally was present and performed or re-performed the history, physical exam and medical decision-making activities of this service and have verified that the service and findings are accurately documented in the student's note.  Dorrell, Andree Elk, MD 10/09/2018, 11:08 AM

## 2018-10-09 NOTE — Progress Notes (Signed)
Pt BP fluctuates from high to low in each arm. Every time RN has taken it in the left it seems to be high. And when taken in the right it is normal. MD notified. Will continue to monitor.

## 2018-10-09 NOTE — Progress Notes (Signed)
Medicine attending discharge note: I personally examined this patient on the day of discharge and I attest to the accuracy of the discharge evaluation and plan as recorded in the final progress note and which will be further detailed in the discharge summary by resident physician Dr. Vilma Prader.  78 year old man in overall good health except for treated hypertension.  He presents with repetitive episodes of presyncope and transient loss of consciousness without any observed tonic-clonic activity.  No cardiorespiratory symptoms.  Symptoms occur typically at rest, not clearly related to change in position and many episodes occur when he is lying down or sitting. His family physician was adjusting his blood pressure medications as an outpatient stopping his diuretic and lowering his ACE inhibitor but this did not have an apparent affect on his symptoms.  Outpatient cardiac monitor failed to show any significant arrhythmias. He was en route to see a cardiologist on the day of admission when he had 3 episodes in the car while his son was driving him to the doctor's office.  He had another episode in the doctor's office.  Ambulance was called and he was transported to the hospital for further evaluation. A CT scan of the brain done prior to admission on March 4 showed no pathology and was normal for age. He did have orthostatic changes in blood pressure on an initial reading.  Following administration of IV fluids, these changes resolved. Cardiogram showed normal sinus rhythm with no acute ischemic change or arrhythmia. Cardiac monitor x48 hours remained in sinus rhythm. Echocardiogram showed hyperdynamic left ventricular function with estimated ejection fraction 60-65%.  Mitral and aortic annular calcification without significant stenosis or regurgitation. He had an episode during the admission.  No arrhythmias noted on monitor.  Blood pressure fell transiently and rose promptly with fluid  administration. He had no focal neurologic deficits. He had no major electrolyte abnormalities.  Etiology of his symptoms is unclear.  Grief reaction to the recent loss of 1 of his sons likely playing some role. Dr. Shellia Cleverly was in contact with his primary care physician.  All parties agreed that he would likely benefit from an antidepressant, anxiolytic, and grief counseling.  Short-term follow-up after discharge to be arranged to address these issues.  Disposition: Condition stable at time of discharge Blood pressure 153/96 off all antihypertensives.  Oxygen saturation 99% on room air. Follow-up with his primary care physician Consider MRI of the brain and EEG if persistent unexplained symptoms. There were no complications.

## 2018-10-09 NOTE — Progress Notes (Signed)
Pt BP is 147/110. MD paged. Will continue to monitor.

## 2018-10-09 NOTE — Discharge Summary (Signed)
Name: Julian Harris MRN: [id number] DOB: [birth year] 78 y.o. PCP: Janie Morning, DO  Date of Admission: 10/07/2018  1:08 PM Date of Discharge:  10/09/18 Attending Physician: Annia Belt, MD   Discharge Diagnosis: 1. Syncope 2. Hypertension 3. Grief reaction  Discharge Medications: Allergies as of 10/09/2018   No Known Allergies     Medication List    TAKE these medications   Vitamin B-12 2500 MCG S[place] Place 2,500 mcg under the tongue daily after breakfast.   vitamin C 500 MG tablet Commonly known as:  ASCORBIC ACID Take 500-1,000 mg by mouth daily after breakfast.   Vitamin D3 125 MCG (5000 UT) Caps Take 5,000 Units by mouth daily after breakfast.       Disposition and follow-up:   Julian Harris was discharged from [facility] in Stable condition.  At the hospital follow up visit please address:  1. Syncope - Cardiac workup normal, may consider tilt table test or MRI of brain if episodes continue - Continue staying hydrated with good nutritional intake - Due to orthostatic hypotension, advised to take a few minutes before going from sitting to standing position  2. Hypertension - No antihypertensive medications for now due to symptoms on admission, defer to PCP - Unsure etiology of difference in BP between left and right arm, but important to consider for future BP monitoring.  3. Grief reaction - Consider starting SSRI and grief counseling, defer to PCP for initiation.  4.  Labs / imaging needed at time of follow-up: None  5.  Pending labs/ test needing follow-up: None  Follow-up Appointments: Follow-up Information    Janie Morning, DO Follow up in 1 week(s).   Specialty:  Family Medicine Contact information: [address] [place] [address] [phone]           Hospital Course by problem list: 1. Syncope:  Julian Harris is a 78 yo M with hx of HTN and grief after recent loss of son who presented with  several months of intermittent lightheadedness and 3 acute episodes of syncope that were non-positional and non-exertional. He was admitted for further evaluation and to rule out cardiac etiology. Initial labs including CBC, BMP, Troponin, U/A, D-dimer (age-adjusted) and serum glucose were unremarkable.  CXR without infiltrate or effusion. Serum glucose normal so unlikely due to hypoglycemia. He had received outpatient workup prior to arrival which included normal head CT and Zio patch cardiac monitoring. The Zio patch results reported by PCP included NSR except for 3 runs of SVT with max of 10 beats duration and max HR of 168 during the runs, although asymptomatic during the events. EKG and cardiac monitoring while in the hospital showed no arrythmic pattern. He received ECHO and carotid doppler studies which were unremarkable so unlikely that these syncopal events have cardiac etiology. There was another episode of lightheadedness while in the hospital, with decrease in BP to the 57S systolic and normal rhythm on telemetry. He was also orthostatic, BP decreased from 116/81 lying to 84/63 standing. After receiving IV NS bolus x2, no orthostatic hypotension on repeat reading. Nurse noted that the BP in the left arm tends to be about 20 mmHg higher than BP in the right arm, although unsure of importance, etiology or pathologic consequence given carotid doppler reported normal subclavian flow. Coarctation of the aorta is unlikely at age 78. He was discharged home with instruction to stay hydrated and avoid orthostatic triggers. Advised to f/u with outpatient cardiology and  PCP for monitoring symptoms.   2. Hypertension: Initial BP slightly elevated with systolic ranging in the 528U, however, no antihypertensives initiated due to recurrent syncopal events, will defer to PCP.   3. Grief reaction: He reported recent loss of his son several months ago which has been difficult for him. Endorses insomnia, poor energy,  decreased appetite, anhedonia and feelings of guilt. We discussed potential benefit with starting SSRI and grief counseling but will defer to PCP.   Discharge Vitals:   BP (!) 153/96   Pulse 68   Temp 97.9 F (36.6 C) (Oral)   Resp 18   Ht 5\' 9"  (1.753 m)   Wt 95 kg   SpO2 99%   BMI 30.92 kg/m   Pertinent Labs, Studies, and Procedures:  CBC    Component Value Date/Time   WBC 8.1 10/07/2018 1334   RBC 4.29 10/07/2018 1334   HGB 13.9 10/07/2018 1334   HCT 41.4 10/07/2018 1334   PLT 231 10/07/2018 1334   MCV 96.5 10/07/2018 1334   MCH 32.4 10/07/2018 1334   MCHC 33.6 10/07/2018 1334   RDW 13.2 10/07/2018 1334   LYMPHSABS 1.7 12/31/2008 1322   MONOABS 0.6 12/31/2008 1322   EOSABS 0.2 12/31/2008 1322   BASOSABS 0.1 12/31/2008 1322   BMP Lab Results  Component Value Date   NA 137 10/07/2018   K 3.5 10/07/2018   CL 106 10/07/2018   CO2 23 10/07/2018   GLUCOSE 117 (H) 10/07/2018   BUN 11 10/07/2018   CREATININE 1.22 10/07/2018   CALCIUM 9.3 10/07/2018   GFRNONAA 56 (L) 10/07/2018   GFRAA >60 10/07/2018   ANIONGAP 8 10/07/2018   D-Dimer Lab Results  Component Value Date   DDIMER 0.54 (H) 10/07/2018   Tropinin Lab Results  Component Value Date   TROPIPOC 0.01 10/07/2018   COMMENT3        10/07/2018   Urinalysis    Component Value Date/Time   COLORURINE YELLOW 10/07/2018 1552   APPEARANCEUR CLEAR 10/07/2018 1552   LABSPEC 1.012 10/07/2018 1552   PHURINE 6.0 10/07/2018 1552   GLUCOSEU NEGATIVE 10/07/2018 1552   HGBUR NEGATIVE 10/07/2018 1552   BILIRUBINUR NEGATIVE 10/07/2018 1552   KETONESUR NEGATIVE 10/07/2018 1552   PROTEINUR NEGATIVE 10/07/2018 1552   UROBILINOGEN 1.0 12/31/2008 1341   NITRITE NEGATIVE 10/07/2018 1552   LEUKOCYTESUR NEGATIVE 10/07/2018 1552   Capillary Glucose Lab Results  Component Value Date   GLUCAP 94 10/08/2018   GLUCAP 95 10/08/2018    Portable Chest Xray 10/07/18 FINDINGS: Cardiac shadow is enlarged but accentuated by the  portable technique. The overall inspiratory effort is poor with mild bibasilar atelectasis. Old rib fractures are noted on the right. No acute bony abnormality is seen.  ECHO 10/07/18 1. The left ventricle has hyperdynamic systolic function, with an ejection fraction of >65%. The cavity size was normal. There is mildly increased left ventricular wall thickness. Left ventricular diastolic Doppler parameters are consistent with  impaired relaxation.  2. Mild thickening of the mitral valve leaflet. There is moderate mitral annular calcification present.  3. The aortic valve is tricuspid Moderate calcification of the aortic valve.  4. Vigorous LV systolic function; mild diastolic dysfunction; mild LVH with proximal septal thickening; calcified aortic valve with no sigificant AS by doppler.  VAS US Carotid Duplex Bilateral 10/08/18 Right Carotid: Velocities in the right ICA are consistent with a 1-39% stenosis. Left Carotid: Velocities in the left ICA are consistent with a 1-39% stenosis. Vertebrals:  Bilateral vertebral arteries  demonstrate antegrade flow. Subclavians: Normal flow hemodynamics were seen in bilateral subclavian              arteries.   Discharge Instructions: Discharge Instructions    Discharge instructions   Complete by:  As directed    It was a pleasure taking care of you while you were in the hospital, Julian Harris!  You were hospitalized for episodes of passing out. Your workup was negative for abnormal heart rhythms or structural heart disease. You were found to have orthostatic hypotension, which means your blood pressure decreases when you stand up. This can be due to many causes. After you got IV fluids, your orthostatic vital signs became normal. You are safe for discharge home.  You should increase your fluid intake at home. For every cup of coffee you drink, drink a cup of water. If it is hot outside or you are exerting yourself, you should increase your water intake.    You should also take you time standing up from a seated or lying down position.   Please follow-up with Dr. Theda Sers in the next 1-2 weeks.   Thanks, Dr. Annie Paras   Increase activity slowly   Complete by:  As directed       Signed: Presley Harris, Medical Student 10/09/2018, 10:34 AM    Attestation for Student Documentation:  I personally was present and performed or re-performed the history, physical exam and medical decision-making activities of this service and have verified that the service and findings are accurately documented in the student's note.  Jacolby Risby, Andree Elk, MD 10/09/2018, 11:43 AM

## 2018-10-09 NOTE — Plan of Care (Signed)

## 2018-10-15 ENCOUNTER — Observation Stay (HOSPITAL_COMMUNITY): Payer: Medicare Other

## 2018-10-15 ENCOUNTER — Other Ambulatory Visit: Payer: Self-pay

## 2018-10-15 ENCOUNTER — Encounter (HOSPITAL_COMMUNITY): Payer: Self-pay

## 2018-10-15 ENCOUNTER — Inpatient Hospital Stay (HOSPITAL_COMMUNITY)
Admission: EM | Admit: 2018-10-15 | Discharge: 2018-10-17 | DRG: 644 | Disposition: A | Discharge status: Home / Self Care | Payer: Medicare Other | Attending: Internal Medicine | Admitting: Internal Medicine

## 2018-10-15 DIAGNOSIS — I9589 Other hypotension: Secondary | ICD-10-CM | POA: Diagnosis not present

## 2018-10-15 DIAGNOSIS — I493 Ventricular premature depolarization: Secondary | ICD-10-CM | POA: Diagnosis not present

## 2018-10-15 DIAGNOSIS — R55 Syncope and collapse: Secondary | ICD-10-CM | POA: Diagnosis present

## 2018-10-15 DIAGNOSIS — Z9181 History of falling: Secondary | ICD-10-CM | POA: Diagnosis not present

## 2018-10-15 DIAGNOSIS — E271 Primary adrenocortical insufficiency: Secondary | ICD-10-CM | POA: Diagnosis not present

## 2018-10-15 DIAGNOSIS — Z87891 Personal history of nicotine dependence: Secondary | ICD-10-CM

## 2018-10-15 DIAGNOSIS — Z79899 Other long term (current) drug therapy: Secondary | ICD-10-CM

## 2018-10-15 DIAGNOSIS — I10 Essential (primary) hypertension: Secondary | ICD-10-CM | POA: Diagnosis present

## 2018-10-15 DIAGNOSIS — I959 Hypotension, unspecified: Secondary | ICD-10-CM

## 2018-10-15 DIAGNOSIS — R627 Adult failure to thrive: Secondary | ICD-10-CM | POA: Diagnosis present

## 2018-10-15 DIAGNOSIS — R0902 Hypoxemia: Secondary | ICD-10-CM | POA: Diagnosis not present

## 2018-10-15 DIAGNOSIS — R42 Dizziness and giddiness: Secondary | ICD-10-CM | POA: Diagnosis not present

## 2018-10-15 DIAGNOSIS — Z8249 Family history of ischemic heart disease and other diseases of the circulatory system: Secondary | ICD-10-CM

## 2018-10-15 DIAGNOSIS — I471 Supraventricular tachycardia: Secondary | ICD-10-CM | POA: Diagnosis present

## 2018-10-15 DIAGNOSIS — G2581 Restless legs syndrome: Secondary | ICD-10-CM | POA: Diagnosis present

## 2018-10-15 DIAGNOSIS — D352 Benign neoplasm of pituitary gland: Secondary | ICD-10-CM | POA: Diagnosis not present

## 2018-10-15 DIAGNOSIS — I951 Orthostatic hypotension: Secondary | ICD-10-CM | POA: Diagnosis not present

## 2018-10-15 DIAGNOSIS — F4320 Adjustment disorder, unspecified: Secondary | ICD-10-CM | POA: Diagnosis present

## 2018-10-15 DIAGNOSIS — E236 Other disorders of pituitary gland: Secondary | ICD-10-CM | POA: Diagnosis not present

## 2018-10-15 DIAGNOSIS — R350 Frequency of micturition: Secondary | ICD-10-CM | POA: Diagnosis present

## 2018-10-15 HISTORY — DX: Essential (primary) hypertension: I10

## 2018-10-15 LAB — BASIC METABOLIC PANEL
Anion gap: 10 (ref 5–15)
BUN: 6 mg/dL — ABNORMAL LOW (ref 8–23)
CO2: 25 mmol/L (ref 22–32)
Calcium: 9.5 mg/dL (ref 8.9–10.3)
Chloride: 99 mmol/L (ref 98–111)
Creatinine, Ser: 1.06 mg/dL (ref 0.61–1.24)
GFR calc Af Amer: >60 mL/min (ref >60)
GFR calc non Af Amer: >60 mL/min (ref >60)
Glucose, Bld: 100 mg/dL — ABNORMAL HIGH (ref 70–99)
Potassium: 3.4 mmol/L — ABNORMAL LOW (ref 3.5–5.1)
Sodium: 134 mmol/L — ABNORMAL LOW (ref 135–145)

## 2018-10-15 LAB — CBC
HCT: 41.4 % (ref 39.0–52.0)
Hemoglobin: 13.9 g/dL (ref 13.0–17.0)
MCH: 31.6 pg (ref 26.0–34.0)
MCHC: 33.6 g/dL (ref 30.0–36.0)
MCV: 94.1 fL (ref 80.0–100.0)
Platelets: 231 10*3/uL (ref 150–400)
RBC: 4.4 MIL/uL (ref 4.22–5.81)
RDW: 12.8 % (ref 11.5–15.5)
WBC: 5.7 10*3/uL (ref 4.0–10.5)
nRBC: 0 % (ref 0.0–0.2)

## 2018-10-15 LAB — CBG MONITORING, ED: Glucose-Capillary: 76 mg/dL (ref 70–99)

## 2018-10-15 LAB — URINALYSIS, ROUTINE W REFLEX MICROSCOPIC
Bilirubin Urine: NEGATIVE
Glucose, UA: NEGATIVE mg/dL
Hgb urine dipstick: NEGATIVE
Ketones, ur: NEGATIVE mg/dL
Leukocytes,Ua: NEGATIVE
Nitrite: NEGATIVE
Protein, ur: NEGATIVE mg/dL
Specific Gravity, Urine: 1.009 (ref 1.005–1.030)
pH: 5 (ref 5.0–8.0)

## 2018-10-15 LAB — PHOSPHORUS: Phosphorus: 4 mg/dL (ref 2.5–4.6)

## 2018-10-15 LAB — MAGNESIUM: Magnesium: 1.9 mg/dL (ref 1.7–2.4)

## 2018-10-15 MED ORDER — VITAMIN C 500 MG PO TABS
500.0000 mg | ORAL_TABLET | Freq: Every day | ORAL | Status: DC
  Administered 2018-10-16 – 2018-10-17 (×2): 1000 mg via ORAL
  Filled 2018-10-15 (×2): qty 2

## 2018-10-15 MED ORDER — MIDODRINE HCL 5 MG PO TABS: 5.0000 mg | ORAL_TABLET | Freq: Two times a day (BID) | ORAL | Status: DC

## 2018-10-15 MED ORDER — SODIUM CHLORIDE 0.9 % IV BOLUS
1000.0000 mL | Freq: Once | INTRAVENOUS | Status: AC
  Administered 2018-10-15: 1000 mL via INTRAVENOUS

## 2018-10-15 MED ORDER — ENOXAPARIN SODIUM 40 MG/0.4ML ~~LOC~~ SOLN
40.0000 mg | SUBCUTANEOUS | Status: DC
  Administered 2018-10-16 – 2018-10-17 (×2): 40 mg via SUBCUTANEOUS
  Filled 2018-10-15 (×2): qty 0.4

## 2018-10-15 MED ORDER — VITAMIN D 25 MCG (1000 UNIT) PO TABS
5000.0000 [IU] | ORAL_TABLET | Freq: Every day | ORAL | Status: DC
  Administered 2018-10-16 – 2018-10-17 (×2): 5000 [IU] via ORAL
  Filled 2018-10-15 (×2): qty 5

## 2018-10-15 MED ORDER — ACETAMINOPHEN 650 MG RE SUPP: 650.0000 mg | Freq: Four times a day (QID) | RECTAL | Status: DC | PRN

## 2018-10-15 MED ORDER — SODIUM CHLORIDE 0.9 % IV BOLUS
1000.0000 mL | Freq: Once | INTRAVENOUS | Status: AC
  Administered 2018-10-16: 1000 mL via INTRAVENOUS

## 2018-10-15 MED ORDER — ACETAMINOPHEN 325 MG PO TABS
650.0000 mg | ORAL_TABLET | Freq: Four times a day (QID) | ORAL | Status: DC | PRN
  Administered 2018-10-15 – 2018-10-16 (×2): 650 mg via ORAL
  Filled 2018-10-15 (×2): qty 2

## 2018-10-15 MED ORDER — VITAMIN B-12 1000 MCG PO TABS
2500.0000 ug | ORAL_TABLET | Freq: Every day | ORAL | Status: DC
  Administered 2018-10-16 – 2018-10-17 (×2): 2500 ug via ORAL
  Filled 2018-10-15 (×2): qty 3

## 2018-10-15 NOTE — Consult Note (Signed)
CARDIOLOGY CONSULT NOTE  Patient ID: Julian Harris MRN: [id number] DOB/AGE: 01-01-1941 79 y.o.  Admit date: 10/15/2018 Referring [place] [place] ED/family request Primary Physician:  Janie Morning, DO Reason for Consultation  Syncope  HPI:   78 y.o. caucasian male  with recurrent syncope, recently discharged from the hospital last week, readmitted through emergency room with another episode of syncope.  Patient was scheduled to see me outpatient for consultation visit, but had to be taken to ED for syncope last week.  Patient son requested if I could see the patient during this hospital admission.  He has had recurrent syncope for loss 6-8 weeks.  Symptoms usually occur while in standing or slightly recumbent position.  Episodes are always preceded with lightheadedness, tingling in his arms, cold clammy sensation, followed by syncope.  Unfortunately, he is not able to abort his syncope episodes by any means.  Repeatedly, he has had orthostatic hypotension associated with these episodes, with remarkable improvement in blood pressure with IV fluids.  Patient has had significant decline in his functional capacity in the last several weeks which started after loss of his son and associated grief.  He does not drink adequate fluids throughout the day.  His Lasix and lisinopril has been discontinued in the past few weeks owing to his recurrent episodes.  Prior cardiology work-up performed did not last hospital admission, as well as through Holter monitor performed through PCP office has not showed any pathology describing the causality of his events.  Past Medical History:  Diagnosis Date  . Hypertension      Past Surgical History:  Procedure Laterality Date  . BILATERAL KNEE ARTHROSCOPY  2010  . CYST REMOVAL TRUNK  2010     Family History  Problem Relation Age of Onset  . Heart disease Father        coronary stents, valve replacement     Social History: Social History    Socioeconomic History  . Marital status: Legally Separated    Spouse name: Not on file  . Number of children: Not on file  . Years of education: Not on file  . Highest education level: Not on file  Occupational History  . Not on file  Social Needs  . Financial resource strain: Not on file  . Food insecurity:    Worry: Not on file    Inability: Not on file  . Transportation needs:    Medical: Not on file    Non-medical: Not on file  Tobacco Use  . Smoking status: Former Smoker    Last attempt to quit: 11/25/1973    Years since quitting: 44.9  . Smokeless tobacco: Never Used  Substance and Sexual Activity  . Alcohol use: Yes    Comment: occasional  . Drug use: No  . Sexual activity: Yes  Lifestyle  . Physical activity:    Days per week: Not on file    Minutes per session: Not on file  . Stress: Not on file  Relationships  . Social connections:    Talks on phone: Not on file    Gets together: Not on file    Attends religious service: Not on file    Active member of club or organization: Not on file    Attends meetings of clubs or organizations: Not on file    Relationship status: Not on file  . Intimate partner violence:    Fear of current or ex partner: Not on file    Emotionally abused: Not on  file    Physically abused: Not on file    Forced sexual activity: Not on file  Other Topics Concern  . Not on file  Social History Narrative  . Not on file     Medications Prior to Admission  Medication Sig Dispense Refill Last Dose  . Cholecalciferol (VITAMIN D3) 125 MCG (5000 UT) CAPS Take 5,000 Units by mouth daily after breakfast.    10/14/2018 at Unknown time  . Cyanocobalamin (VITAMIN B-12) 2500 MCG SUBL Place 2,500 mcg under the tongue daily after breakfast.    10/14/2018 at Unknown time  . vitamin C (ASCORBIC ACID) 500 MG tablet Take 500-1,000 mg by mouth daily after breakfast.    10/14/2018 at Unknown time    ROS    Physical Exam: Physical Exam   Labs:    Lab Results  Component Value Date   WBC 5.7 10/15/2018   HGB 13.9 10/15/2018   HCT 41.4 10/15/2018   MCV 94.1 10/15/2018   PLT 231 10/15/2018    Recent Labs  Lab 10/15/18 1213  NA 134*  K 3.4*  CL 99  CO2 25  BUN 6*  CREATININE 1.06  CALCIUM 9.5  GLUCOSE 100*    Lipid Panel  No results found for: CHOL, TRIG, HDL, CHOLHDL, VLDL, LDLCALC  BNP (last 3 results) No results for input(s): BNP in the last 8760 hours.  HEMOGLOBIN A1C No results found for: HGBA1C, MPG  Cardiac Panel (last 3 results) No results for input(s): CKTOTAL, CKMB, TROPONINI, RELINDX in the last 8760 hours.  No results found for: CKTOTAL, CKMB, CKMBINDEX, TROPONINI   TSH No results for input(s): TSH in the last 8760 hours.    Radiology: No results found.  Scheduled Meds: . [START ON 10/16/2018] cholecalciferol  5,000 Units Oral QPC breakfast  . [START ON 10/16/2018] enoxaparin (LOVENOX) injection  40 mg Subcutaneous Q24H  . [START ON 10/16/2018] vitamin B-12  2,500 mcg Oral QPC breakfast  . [START ON 10/16/2018] vitamin C  500-1,000 mg Oral QPC breakfast   Continuous Infusions: PRN Meds:.acetaminophen **OR** acetaminophen  CARDIAC STUDIES:  EKG 10/15/2018: Sinus rhythm 78 bpm.  Ventricular quadrigeminy.  No other significant changes.  Borderline prolonged QT interval.  Echocardiogram 10/15/2018:  1. The left ventricle has hyperdynamic systolic function, with an ejection fraction of >65%. The cavity size was normal. There is mildly increased left ventricular wall thickness. Left ventricular diastolic Doppler parameters are consistent with  impaired relaxation.  2. Mild thickening of the mitral valve leaflet. There is moderate mitral annular calcification present.  3. The aortic valve is tricuspid Moderate calcification of the aortic valve.  4. Vigorous LV systolic function; mild diastolic dysfunction; mild LVH with proximal septal thickening; calcified aortic valve with no sigificant AS by  doppler.   Assessment & Recommendations:  78 y.o. Caucasian male  with recurrent syncope:  Syncope: Clear correlation with orthostatic hypotension. He is remarkably fluid responsive. He has continued to have these episodes in spite of stopping lasix and lisinopril. Management options will include liberal hydration, compression stockings. I counseled them regarding counterpressure maneuvers. In addition, recommend adding midodrine 5 mg bid during the day. I do think his deconditioning and grief reaction/depression is further adding to his symptoms. He will need grief counseling. I will continue to follow him inpatient/outpatient (Has an appt 0n 03/19 with me).  [name], MD 10/15/2018, 4:50 PM [facility] Pager: [phone] Office: [phone] If no answer Cell [phone]

## 2018-10-15 NOTE — ED Triage Notes (Signed)
Pt arrives EMS from home with c/o hypotension, bradycardia and syncope this am. Given 400 cc ns PTA with increased bp from 56/33 to 118/64. Seen and admitted here 10/07/18-10/09/18 for similar symptoms.

## 2018-10-15 NOTE — ED Notes (Signed)
Admitting team at bedside.

## 2018-10-15 NOTE — ED Notes (Signed)
ED TO INPATIENT HANDOFF REPORT  ED Nurse Name and Phone #: [facility] [phone]  S Name/Age/Gender Julian Harris 78 y.o. male Room/Bed: 029C/029C  Code Status   Code Status: Full Code  Home/SNF/Other Home Patient oriented to: self, place, time and situation Is this baseline? Yes   Triage Complete: Triage complete  Chief Complaint Hypotensive Orthostatic  Triage Note Pt arrives EMS from home with c/o hypotension, bradycardia and syncope this am. Given 400 cc ns PTA with increased bp from 56/33 to 118/64. Seen and admitted here 10/07/18-10/09/18 for similar symptoms.   Allergies No Known Allergies  Level of Care/Admitting Diagnosis ED Disposition    ED Disposition Condition [facility] [facility] Area: [place] [100100]  Level of Care: Telemetry Cardiac [103]  Diagnosis: Syncope [500938]  Admitting Physician: Aldine Contes [[id number]]  Attending Physician: Aldine Contes [phone]  PT Class (Do Not Modify): Observation [104]  PT Acc Code (Do Not Modify): Observation [10022]       B Medical/Surgery History Past Medical History:  Diagnosis Date  . Hypertension    Past Surgical History:  Procedure Laterality Date  . BILATERAL KNEE ARTHROSCOPY  2010  . CYST REMOVAL TRUNK  2010     A IV Location/Drains/Wounds Patient Lines/Drains/Airways Status   Active Line/Drains/Airways    Name:   Placement date:   Placement time:   Site:   Days:   Peripheral IV 10/15/18 Left Hand   10/15/18    1134    Hand   less than 1          Intake/Output Last 24 hours No intake or output data in the 24 hours ending 10/15/18 1542  Labs/Imaging Results for orders placed or performed during the hospital encounter of 10/15/18 (from the past 48 hour(s))  Urinalysis, Routine w reflex microscopic     Status: None   Collection Time: 10/15/18 11:54 AM  Result Value Ref Range   Color, Urine YELLOW YELLOW   APPearance CLEAR CLEAR   Specific Gravity, Urine 1.009  1.005 - 1.030   pH 5.0 5.0 - 8.0   Glucose, UA NEGATIVE NEGATIVE mg/dL   Hgb urine dipstick NEGATIVE NEGATIVE   Bilirubin Urine NEGATIVE NEGATIVE   Ketones, ur NEGATIVE NEGATIVE mg/dL   Protein, ur NEGATIVE NEGATIVE mg/dL   Nitrite NEGATIVE NEGATIVE   Leukocytes,Ua NEGATIVE NEGATIVE    Comment: Performed at [facility], [address] [place], [place] [address]  CBG monitoring, ED     Status: None   Collection Time: 10/15/18 11:57 AM  Result Value Ref Range   Glucose-Capillary 76 70 - 99 mg/dL   Comment 1 Notify RN    Comment 2 Document in Chart   CBC     Status: None   Collection Time: 10/15/18 12:13 PM  Result Value Ref Range   WBC 5.7 4.0 - 10.5 K/uL   RBC 4.40 4.22 - 5.81 MIL/uL   Hemoglobin 13.9 13.0 - 17.0 g/dL   HCT 41.4 39.0 - 52.0 %   MCV 94.1 80.0 - 100.0 fL   MCH 31.6 26.0 - 34.0 pg   MCHC 33.6 30.0 - 36.0 g/dL   RDW 12.8 11.5 - 15.5 %   Platelets 231 150 - 400 K/uL   nRBC 0.0 0.0 - 0.2 %    Comment: Performed at [facility], [place] [address], [place], [place] [address]  Basic metabolic panel     Status: Abnormal   Collection Time: 10/15/18 12:13 PM  Result Value Ref Range   Sodium 134 (L) 135 - 145 mmol/L   Potassium 3.4 (L) 3.5 - 5.1 mmol/L   Chloride 99 98 - 111 mmol/L   CO2 25 22 - 32 mmol/L   Glucose, Bld 100 (H) 70 - 99 mg/dL   BUN 6 (L) 8 - 23 mg/dL   Creatinine, Ser 1.06 0.61 - 1.24 mg/dL   Calcium 9.5 8.9 - 10.3 mg/dL   GFR calc non Af Amer >60 >60 mL/min   GFR calc Af Amer >60 >60 mL/min   Anion gap 10 5 - 15    Comment: Performed at [facility] [address] [place], [place] [address]  Magnesium     Status: None   Collection Time: 10/15/18  2:52 PM  Result Value Ref Range   Magnesium 1.9 1.7 - 2.4 mg/dL    Comment: Performed at [facility], [place] [address], [place], [place] [address]  Phosphorus     Status: None   Collection Time: 10/15/18  2:52 PM  Result Value Ref Range   Phosphorus 4.0 2.5 - 4.6 mg/dL     Comment: Performed at [facility] [address], [place], [place] [address]   No results found.  Pending Labs Unresulted Labs (From admission, onward)   None      Vitals/Pain Today's Vitals   10/15/18 1330 10/15/18 1400 10/15/18 1430 10/15/18 1500  BP: (!) 131/94 111/77 104/74 (!) 116/100  Pulse: 79 80 80 79  Resp: 16 20 12 14   Temp:      TempSrc:      SpO2: 99% 96% 100% 98%  Weight:      Height:      PainSc:        Isolation Precautions No active isolations  Medications Medications  Vitamin B-12 SUBL 2,500 mcg (has no administration in time range)  Vitamin D3 CAPS 5,000 Units (has no administration in time range)  vitamin C (ASCORBIC ACID) tablet 500-1,000 mg (has no administration in time range)  enoxaparin (LOVENOX) injection 40 mg (has no administration in time range)  acetaminophen (TYLENOL) tablet 650 mg (has no administration in time range)    Or  acetaminophen (TYLENOL) suppository 650 mg (has no administration in time range)  sodium chloride 0.9 % bolus 1,000 mL (1,000 mLs Intravenous New Bag/Given 10/15/18 1459)    Mobility walks High fall risk   Focused Assessments Cardiac Assessment Handoff:  Cardiac Rhythm: Normal sinus rhythm No results found for: CKTOTAL, CKMB, CKMBINDEX, TROPONINI Lab Results  Component Value Date   DDIMER 0.54 (H) 10/07/2018   Does the Patient currently have chest pain? No     R Recommendations: See Admitting Provider Note  Report given to:   Additional Notes:  Pt is hard of hearing and does not have his hearing aids

## 2018-10-15 NOTE — H&P (Addendum)
Date: 10/15/2018               Patient Name:  Julian Harris MRN: [id number]  DOB: 06-15-1941 Age / Sex: 78 y.o., male   PCP: Janie Morning, DO         Medical Service: Internal Medicine Teaching Service         Attending Physician: Dr. Aldine Contes, MD    First Contact: Dr. Annie Paras Pager: [phone]  Second Contact: Dr. Berline Lopes Pager: [phone]       After Hours (After 5p/  First Contact Pager: [phone]  weekends / holidays): Second Contact Pager: [phone]   Chief Complaint: syncope  History of Present Illness: Mr. Speir is a 78 yo M with hx of HTN and grief after recent loss of son who presented with hypotension and syncope. He was admitted from 10/07/2018 to 10/09/2018 for the same problem and was diagnosed with orthostatic hypotension. Since his discharge, the patient's neighbor (who is a Marine scientist) has been checking the patient's BP and HR every thirty minutes. The patient's son states that Mr. Fults's SBP has been fluctuating between 70-100s for the past few days; today his SBP was in the 50s with his eyes fluttering while sitting in a lazy boy recliner. Last night he also felt weak and his HR was measured to be in the 30s.The son initially reports that the patient has been drinking water since his discharge, but after more discussion patient states he has not been eating or drinking much due to nausea and poor appetite. He was scheduled to see his primary care doctor yesterday to discuss treatment for his grief.   Mr. Groeneveld endorses chills, night sweats, and new headaches at the nape of his neck. He denies fevers, chest pain, palpitations, abdominal pain, diarrhea, melena, hematochezia, hematuria, dysuria, cough, hemoptysis,   EMS report reveals orthostasis (lowest BP 56/33) with HR in the 60s-100s. Upon arrival to the ED, the patient was normotensive and bradycardic to 40. His HR increased to normal shortly after. Na 134, K 3.4, Mag 1.9, and phos wnl. CBC and UA were unremarkable.  EKG showed low voltage, NSR, and infrequent PVCs. He received 1L fluids.  Meds:  Current Meds  Medication Sig  . Cholecalciferol (VITAMIN D3) 125 MCG (5000 UT) CAPS Take 5,000 Units by mouth daily after breakfast.   . Cyanocobalamin (VITAMIN B-12) 2500 MCG SUBL Place 2,500 mcg under the tongue daily after breakfast.   . vitamin C (ASCORBIC ACID) 500 MG tablet Take 500-1,000 mg by mouth daily after breakfast.    Allergies: Allergies as of 10/15/2018  . (No Known Allergies)   Past Medical History:  Diagnosis Date  . Hypertension    Past Surgical History:  Procedure Laterality Date  . BILATERAL KNEE ARTHROSCOPY  2010  . CYST REMOVAL TRUNK  2010    Family History: Father with CAD. Granddaughter with DMI.  Social History: Originally from [place]. Retired and moved to [place] to take care of his granddaughter. He lives alone now and is independent in all of his activities. Never smoker. Infrequent etoh use. Denies illicit drug use.   Review of Systems: A complete ROS was negative except as per HPI.   Physical Exam: Blood pressure (!) 142/92, pulse 82, temperature 99.1 F (37.3 C), temperature source Oral, resp. rate 16, height 5\' 10"  (1.778 m), weight 94.8 kg, SpO2 100 %.  Constitutional: Well-developed, well-nourished, and in no distress.  Neuro: Alert and oriented x3. Cranial nerves II-XII intact. 5/5  strength in all four extremities. No tremor. Cardiovascular: Normal rate and regular rhythm. No murmurs, rubs, or gallops. Pulmonary/Chest: Effort normal. Clear to auscultation bilaterally. No wheezes, rales, or rhonchi. Abdominal: Bowel sounds present. Soft, non-distended, non-tender. Ext: No lower extremity edema. Skin: Warm and dry. No rashes or wounds.  EKG: personally reviewed my interpretation is low voltage, NSR, and infrequent PVCs.  Assessment & Plan by Problem: Active Problems:   Syncope  Mr. Zuercher is a 78 yo M with hx of HTN and grief after recent loss of  his son who presented with hypotension and syncope. He was admitted from 10/07/2018 to 10/09/2018 for the same problem. During that hospitalization, he had an extensive negative workup including troponins, EKG, CXR, ECHO (EF > 73% with diastolic dysfunction), and carotid dopplers (1-39% stenosis bilaterally). He was orthostatic during the admission, but this resolved with IVF. He also had a negative head CT recently. He wore a Zio patch earlier this month, and this showed NSR except for 3 runs of SVT with a maximum length of 10 beats and a maximum HR of 168. He was only symptomatic when in NSR. The etiology of his syncopal episodes was unknown at the time of discharge. Ddx included orthostatic hypotension in the setting of dehydration, vasovagal syncope, grief reaction, and carotid sinus hypersensitivity. He was advised to f/u with his PCP for initiation of grief therapies and with his cardiologist to continue working up these episodes.     Syncope - Orthostatic hypotension present again today. Patient admits to poor PO intake of both food and water. After fluid administration, his BP increased to the 419F systolic and HR increased to the 80s. His syncopal episodes in the setting of a negative cardiac workup and positive orthostatics responsive to fluid is consistent with orthostatic hypotension. He may benefit from midodrine as he has been unable to keep up his PO fluid intake. - I believe grief reaction is contributing to his failure to thrive status in recent months. His poor appetite and low energy are likely associated with his grief. He has not yet started therapy for this process, but he plans to see his PCP for further discussion soon. He had an appointment scheduled for tomorrow.  - DDx continues to include vasovagal syncope, grief reaction, and carotid sinus hypersensitivity as well as adrenal insufficiency or autonomic dysfunction.  - Given his symptoms of poor appetite, nausea, food aversion, night  sweats, and new onset headaches in a 78 year old man, would like to rule out malignancies, including brain and GI mass, as the cause. Patient had a normal colonoscopy at age 40 and was told he would not need another screening colonoscopy in his lifetime.  Plan - Tele  - Cards following, appreciate recs. Patient has outpatient f/u with Dr. Virgina Jock scheduled for 3/19. - Repeat orthostatics after fluid bolus. Give additional IVF if still positive.  - Start midodrine 5mg  BID tomorrow morning per cards - Brain MRI  - Am cortisol   Grief Reaction - Patient is mourning the death of his son. He has symptoms of depression including sadness, anhedonia, difficulty sleeping, low energy, and poor appetite. These symptoms are likely contributing to his poor PO intake and orthostasis. Plan - Grief counseling and pharmacotherapy per PCP upon discharge  FEN: no IV fluids, regular diet, replace electrolytes as needed  DVT ppx: Lovenox Code status: FULL code  Dispo: Admit patient to Observation with expected length of stay less than 2 midnights.  Signed: Shiya Fogelman, Andree Elk, MD  10/15/2018, 5:34 PM  Pager: [phone]

## 2018-10-15 NOTE — ED Provider Notes (Signed)
[facility] Emergency Department Provider Note MRN:  [id number]  Arrival date & time: 10/16/18     Chief Complaint   Near Syncope and Hypotension   History of Present Illness   Julian Harris is a 78 y.o. year-old male with a history of hypertension presenting to the ED with chief complaint of near syncope.  Patient was experiencing general malaise, made worse when trying to stand up or walk around his home.  Neighbor came by to assess his blood pressure, which was very low.  Found to be in the 27O to 53G systolic with EMS when standing.  Patient had a recent hospitalization for similar issues with orthostatic hypotension a couple weeks ago.  Patient denies chest pain or shortness of breath, no headache or vision change, no abdominal pain, no leg pain or swelling, no recent travel, no recent surgeries.  Poor appetite recently but states he is drinking well, no vomiting, no diarrhea.  Review of Systems  A complete 10 system review of systems was obtained and all systems are negative except as noted in the HPI and PMH.   Patient's Health History    Past Medical History:  Diagnosis Date  . Hypertension     Past Surgical History:  Procedure Laterality Date  . BILATERAL KNEE ARTHROSCOPY  2010  . CYST REMOVAL TRUNK  2010    Family History  Problem Relation Age of Onset  . Heart disease Father        coronary stents, valve replacement    Social History   Socioeconomic History  . Marital status: Legally Separated    Spouse name: Not on file  . Number of children: Not on file  . Years of education: Not on file  . Highest education level: Not on file  Occupational History  . Not on file  Social Needs  . Financial resource strain: Not on file  . Food insecurity:    Worry: Not on file    Inability: Not on file  . Transportation needs:    Medical: Not on file    Non-medical: Not on file  Tobacco Use  . Smoking status: Former Smoker    Last attempt to quit:  11/25/1973    Years since quitting: 44.9  . Smokeless tobacco: Never Used  Substance and Sexual Activity  . Alcohol use: Yes    Comment: occasional  . Drug use: No  . Sexual activity: Yes  Lifestyle  . Physical activity:    Days per week: Not on file    Minutes per session: Not on file  . Stress: Not on file  Relationships  . Social connections:    Talks on phone: Not on file    Gets together: Not on file    Attends religious service: Not on file    Active member of club or organization: Not on file    Attends meetings of clubs or organizations: Not on file    Relationship status: Not on file  . Intimate partner violence:    Fear of current or ex partner: Not on file    Emotionally abused: Not on file    Physically abused: Not on file    Forced sexual activity: Not on file  Other Topics Concern  . Not on file  Social History Narrative  . Not on file     Physical Exam  Vital Signs and Nursing Notes reviewed Vitals:   10/16/18 1217 10/16/18 1315  BP: (!) 136/91 106/64  Pulse: 79  67  Resp: 18   Temp: 97.7 F (36.5 C)   SpO2: 98%     CONSTITUTIONAL: Chronically ill-appearing, NAD NEURO:  Alert and oriented x 3, no focal deficits EYES:  eyes equal and reactive ENT/NECK:  no LAD, no JVD CARDIO: Regular rate, well-perfused, normal S1 and S2 PULM:  CTAB no wheezing or rhonchi GI/GU:  normal bowel sounds, non-distended, non-tender MSK/SPINE:  No gross deformities, no edema SKIN:  no rash, atraumatic PSYCH:  Appropriate speech and behavior  Diagnostic and Interventional Summary    EKG Interpretation  Date/Time:  Tuesday October 15 2018 11:37:35 EDT Ventricular Rate:  78 PR Interval:    QRS Duration: 100 QT Interval:  435 QTC Calculation: 496 R Axis:   67 Text Interpretation:  Sinus rhythm Multiple ventricular premature complexes Low voltage, precordial leads Borderline T wave abnormalities Borderline prolonged QT interval Confirmed by Gerlene Fee [phone]) on  10/15/2018 11:40:24 AM      Labs Reviewed  BASIC METABOLIC PANEL - Abnormal; Notable for the following components:      Result Value   Sodium 134 (*)    Potassium 3.4 (*)    Glucose, Bld 100 (*)    BUN 6 (*)    All other components within normal limits  BASIC METABOLIC PANEL - Abnormal; Notable for the following components:   BUN 5 (*)    All other components within normal limits  CBC  URINALYSIS, ROUTINE W REFLEX MICROSCOPIC  MAGNESIUM  PHOSPHORUS  CORTISOL  TSH  SODIUM, URINE, RANDOM  ACTH STIMULATION, 3 TIME POINTS  ALDOSTERONE + RENIN ACTIVITY W/ RATIO  CBG MONITORING, ED    MR BRAIN WO CONTRAST  Final Result      Medications  vitamin B-12 (CYANOCOBALAMIN) tablet 2,500 mcg (2,500 mcg Oral Given 10/16/18 0947)  cholecalciferol (VITAMIN D3) tablet 5,000 Units (5,000 Units Oral Given 10/16/18 0946)  vitamin C (ASCORBIC ACID) tablet 500-1,000 mg (1,000 mg Oral Given 10/16/18 0947)  enoxaparin (LOVENOX) injection 40 mg (40 mg Subcutaneous Given 10/16/18 0957)  acetaminophen (TYLENOL) tablet 650 mg (650 mg Oral Given 10/15/18 1717)    Or  acetaminophen (TYLENOL) suppository 650 mg ( Rectal See Alternative 10/15/18 1717)  cosyntropin (CORTROSYN) injection 0.25 mg (has no administration in time range)  sodium chloride 0.9 % bolus 1,000 mL (0 mLs Intravenous Stopped 10/15/18 1555)  sodium chloride 0.9 % bolus 1,000 mL (0 mLs Intravenous Stopped 10/16/18 0200)  sodium chloride 0.9 % bolus 1,000 mL (0 mLs Intravenous Stopped 10/16/18 1200)  cosyntropin (CORTROSYN) injection 0.25 mg (0.25 mg Intravenous Given 10/16/18 1111)     Procedures Critical Care  ED Course and Medical Decision Making  I have reviewed the triage vital signs and the nursing notes.  Pertinent labs & imaging results that were available during my care of the patient were reviewed by me and considered in my medical decision making (see below for details).  Continued orthostatic hypotension and fall risk in this  78 year old male who lives alone.  According to recent discharge summary, patient's heart was thoroughly evaluated during recent admission, but there was comment that if symptoms were to continue they would consider MRI and/or tilt table testing.  Will obtain basic labs, request admission.  Patient is without evidence of DVT on exam, no hypoxia, no shortness of breath, no chest pain, little to no concern for PE.  Patient's hypotensive episode and syncope actually occurred while sitting today per family.  Admitted to medicine service for high risk syncope.  Barth Kirks.  Sedonia Small, MD [place] [email or web address]  Final Clinical Impressions(s) / ED Diagnoses     ICD-10-CM   1. Syncope, unspecified syncope type R55   2. Hypotension, unspecified hypotension type I95.9         Maudie Flakes, MD 10/16/18 1512

## 2018-10-16 DIAGNOSIS — I951 Orthostatic hypotension: Secondary | ICD-10-CM | POA: Diagnosis not present

## 2018-10-16 DIAGNOSIS — D352 Benign neoplasm of pituitary gland: Secondary | ICD-10-CM | POA: Diagnosis not present

## 2018-10-16 DIAGNOSIS — G939 Disorder of brain, unspecified: Secondary | ICD-10-CM | POA: Diagnosis not present

## 2018-10-16 DIAGNOSIS — I471 Supraventricular tachycardia: Secondary | ICD-10-CM | POA: Diagnosis present

## 2018-10-16 DIAGNOSIS — R627 Adult failure to thrive: Secondary | ICD-10-CM | POA: Diagnosis present

## 2018-10-16 DIAGNOSIS — Z79899 Other long term (current) drug therapy: Secondary | ICD-10-CM | POA: Diagnosis not present

## 2018-10-16 DIAGNOSIS — Z87891 Personal history of nicotine dependence: Secondary | ICD-10-CM | POA: Diagnosis not present

## 2018-10-16 DIAGNOSIS — I493 Ventricular premature depolarization: Secondary | ICD-10-CM | POA: Diagnosis present

## 2018-10-16 DIAGNOSIS — E271 Primary adrenocortical insufficiency: Secondary | ICD-10-CM | POA: Diagnosis present

## 2018-10-16 DIAGNOSIS — Z8249 Family history of ischemic heart disease and other diseases of the circulatory system: Secondary | ICD-10-CM | POA: Diagnosis not present

## 2018-10-16 DIAGNOSIS — R55 Syncope and collapse: Secondary | ICD-10-CM | POA: Diagnosis not present

## 2018-10-16 DIAGNOSIS — F4320 Adjustment disorder, unspecified: Secondary | ICD-10-CM | POA: Diagnosis not present

## 2018-10-16 DIAGNOSIS — R350 Frequency of micturition: Secondary | ICD-10-CM | POA: Diagnosis not present

## 2018-10-16 DIAGNOSIS — G2581 Restless legs syndrome: Secondary | ICD-10-CM | POA: Diagnosis present

## 2018-10-16 DIAGNOSIS — I10 Essential (primary) hypertension: Secondary | ICD-10-CM | POA: Diagnosis not present

## 2018-10-16 DIAGNOSIS — E2740 Unspecified adrenocortical insufficiency: Secondary | ICD-10-CM | POA: Diagnosis not present

## 2018-10-16 LAB — ACTH STIMULATION, 3 TIME POINTS
Cortisol, 30 Min: 10 ug/dL
Cortisol, 60 Min: 12.3 ug/dL
Cortisol, Base: 1.1 ug/dL

## 2018-10-16 LAB — BASIC METABOLIC PANEL
Anion gap: 9 (ref 5–15)
BUN: 5 mg/dL — ABNORMAL LOW (ref 8–23)
CO2: 26 mmol/L (ref 22–32)
Calcium: 9.2 mg/dL (ref 8.9–10.3)
Chloride: 101 mmol/L (ref 98–111)
Creatinine, Ser: 1.05 mg/dL (ref 0.61–1.24)
GFR calc Af Amer: >60 mL/min (ref >60)
GFR calc non Af Amer: >60 mL/min (ref >60)
Glucose, Bld: 97 mg/dL (ref 70–99)
Potassium: 3.5 mmol/L (ref 3.5–5.1)
Sodium: 136 mmol/L (ref 135–145)

## 2018-10-16 LAB — TSH: TSH: 0.815 u[IU]/mL (ref 0.350–4.500)

## 2018-10-16 LAB — CORTISOL: Cortisol, Plasma: 1 ug/dL

## 2018-10-16 LAB — SODIUM, URINE, RANDOM: Sodium, Ur: 86 mmol/L

## 2018-10-16 MED ORDER — COSYNTROPIN 0.25 MG IJ SOLR
0.2500 mg | Freq: Once | INTRAMUSCULAR | Status: AC
  Administered 2018-10-17: 0.25 mg via INTRAVENOUS
  Filled 2018-10-16 (×2): qty 0.25

## 2018-10-16 MED ORDER — COSYNTROPIN 0.25 MG IJ SOLR
0.2500 mg | Freq: Once | INTRAMUSCULAR | Status: AC
  Administered 2018-10-16: 0.25 mg via INTRAVENOUS
  Filled 2018-10-16: qty 0.25

## 2018-10-16 MED ORDER — MIDODRINE HCL 5 MG PO TABS: 5.0000 mg | ORAL_TABLET | Freq: Three times a day (TID) | ORAL | 3 refills | Status: DC

## 2018-10-16 MED ORDER — SODIUM CHLORIDE 0.9 % IV BOLUS
1000.0000 mL | Freq: Once | INTRAVENOUS | Status: AC
  Administered 2018-10-16: 1000 mL via INTRAVENOUS

## 2018-10-16 NOTE — Progress Notes (Signed)
   10/16/18 2259  Vitals  Temp 98.2 F (36.8 C)  Temp Source Oral  BP 110/84  MAP (mmHg) 93  BP Location Left Arm  BP Method Automatic  Patient Position (if appropriate) Sitting  Pulse Rate 83  Pulse Rate Source Monitor  Resp 18  Oxygen Therapy  SpO2 98 %  O2 Device Room Air  MEWS Score  MEWS RR 0  MEWS Pulse 0  MEWS Systolic 0  MEWS LOC 0  MEWS Temp 0  MEWS Score 0  MEWS Score Color Green  Received pt from [facility] to rm 3E21, pt alert and oriented, denied pain at this time, oriented to room, call bell placed within reach, placed on cardiac monitor, CCMD made aware.

## 2018-10-16 NOTE — Progress Notes (Addendum)
Patient complains of frequent urination, urinating 170ml at a time notified MD, given NS 1053ml bolus per orders.

## 2018-10-16 NOTE — Progress Notes (Deleted)
Patient referred by Julian Morning, DO for ***  Subjective:   Julian Harris, male    DOB: May 13, 1941, 78 y.o.   MRN: [id number]   No chief complaint on file.   *** HPI  78 y.o. *** male with ***  *** Past Medical History:  Diagnosis Date  . Hypertension     *** Past Surgical History:  Procedure Laterality Date  . BILATERAL KNEE ARTHROSCOPY  2010  . CYST REMOVAL TRUNK  2010    *** Social History   Socioeconomic History  . Marital status: Legally Separated    Spouse name: Not on file  . Number of children: Not on file  . Years of education: Not on file  . Highest education level: Not on file  Occupational History  . Not on file  Social Needs  . Financial resource strain: Not on file  . Food insecurity:    Worry: Not on file    Inability: Not on file  . Transportation needs:    Medical: Not on file    Non-medical: Not on file  Tobacco Use  . Smoking status: Former Smoker    Last attempt to quit: 11/25/1973    Years since quitting: 44.9  . Smokeless tobacco: Never Used  Substance and Sexual Activity  . Alcohol use: Yes    Comment: occasional  . Drug use: No  . Sexual activity: Yes  Lifestyle  . Physical activity:    Days per week: Not on file    Minutes per session: Not on file  . Stress: Not on file  Relationships  . Social connections:    Talks on phone: Not on file    Gets together: Not on file    Attends religious service: Not on file    Active member of club or organization: Not on file    Attends meetings of clubs or organizations: Not on file    Relationship status: Not on file  . Intimate partner violence:    Fear of current or ex partner: Not on file    Emotionally abused: Not on file    Physically abused: Not on file    Forced sexual activity: Not on file  Other Topics Concern  . Not on file  Social History Narrative  . Not on file    *** Current Facility-Administered Medications on File Prior to Visit  Medication Dose Route  Frequency Provider Last Rate Last Dose  . acetaminophen (TYLENOL) tablet 650 mg  650 mg Oral Q6H PRN Alphonzo Grieve, MD   650 mg at 10/15/18 1717   Or  . acetaminophen (TYLENOL) suppository 650 mg  650 mg Rectal Q6H PRN Alphonzo Grieve, MD      . cholecalciferol (VITAMIN D3) tablet 5,000 Units  5,000 Units Oral QPC breakfast Alphonzo Grieve, MD   5,000 Units at 10/16/18 0946  . [START ON 10/17/2018] cosyntropin (CORTROSYN) injection 0.25 mg  0.25 mg Intravenous Once Dorrell, Andree Elk, MD      . enoxaparin (LOVENOX) injection 40 mg  40 mg Subcutaneous Q24H Alphonzo Grieve, MD   40 mg at 10/16/18 0957  . vitamin B-12 (CYANOCOBALAMIN) tablet 2,500 mcg  2,500 mcg Oral QPC breakfast Alphonzo Grieve, MD   2,500 mcg at 10/16/18 0947  . vitamin C (ASCORBIC ACID) tablet 500-1,000 mg  500-1,000 mg Oral QPC breakfast Alphonzo Grieve, MD   1,000 mg at 10/16/18 9735   Current Outpatient Medications on File Prior to Visit  Medication Sig Dispense Refill  .  Cholecalciferol (VITAMIN D3) 125 MCG (5000 UT) CAPS Take 5,000 Units by mouth daily after breakfast.     . Cyanocobalamin (VITAMIN B-12) 2500 MCG SUBL Place 2,500 mcg under the tongue daily after breakfast.     . midodrine (PROAMATINE) 5 MG tablet Take 1 tablet (5 mg total) by mouth 3 (three) times daily with meals. 60 tablet 3  . vitamin C (ASCORBIC ACID) 500 MG tablet Take 500-1,000 mg by mouth daily after breakfast.       Cardiovascular studies:  ***  *** Recent labs:  ***  Review of Systems  Constitution: Negative for decreased appetite, malaise/fatigue, weight gain and weight loss.  HENT: Negative for congestion.   Eyes: Negative for visual disturbance.  Cardiovascular: Negative for chest pain, dyspnea on exertion, leg swelling, palpitations and syncope.  Respiratory: Negative for shortness of breath.   Endocrine: Negative for cold intolerance.  Hematologic/Lymphatic: Does not bruise/bleed easily.  Skin: Negative for itching and rash.   Musculoskeletal: Negative for myalgias.  Gastrointestinal: Negative for abdominal pain, nausea and vomiting.  Genitourinary: Negative for dysuria.  Neurological: Negative for dizziness and weakness.  Psychiatric/Behavioral: The patient is not nervous/anxious.   All other systems reviewed and are negative.       *** There were no vitals filed for this visit.  Objective:   Physical Exam  Constitutional: He is oriented to person, place, and time. He appears well-developed and well-nourished. No distress.  HENT:  Head: Normocephalic and atraumatic.  Eyes: Pupils are equal, round, and reactive to light. Conjunctivae are normal.  Neck: No JVD present.  Cardiovascular: Normal rate, regular rhythm and intact distal pulses.  Pulmonary/Chest: Effort normal and breath sounds normal. He has no wheezes. He has no rales.  Abdominal: Soft. Bowel sounds are normal. There is no rebound.  Musculoskeletal:        General: No edema.  Lymphadenopathy:    He has no cervical adenopathy.  Neurological: He is alert and oriented to person, place, and time. No cranial nerve deficit.  Skin: Skin is warm and dry.  Psychiatric: He has a normal mood and affect.  Nursing note and vitals reviewed.         Assessment & Recommendations:   ***  There are no diagnoses linked to this encounter.   Thank you for referring the patient to [place]. Please feel free to contact with any questions.  [name], MD [facility] Pager: [phone] Office: [phone] If no answer Cell [phone]

## 2018-10-16 NOTE — Progress Notes (Signed)
   Subjective: Julian Harris continued to be orthostatic last night with BP dropping from 113/85 to 77/55 and received an additional fluid bolus. He had no episodes of syncope overnight. This morning, he reports that his mouth is really dry and he hasn't had much to eat or drink because he doesn't like the food here. He states he has lost 10lbs recently. He also reports poor attention and memory since his son died. We discussed the pituitary mass seen on MRI and the plan to do more blood work to rule out hormonal imbalances. All of his questions were answered.    Objective:  Vital signs in last 24 hours: Vitals:   10/15/18 1530 10/15/18 1648 10/16/18 0001 10/16/18 0628  BP: (!) 144/127 (!) 142/92 128/70 122/86  Pulse: 80 82 79 79  Resp: (!) 22 16 16 18   Temp:  99.1 F (37.3 C) 99 F (37.2 C) 98.3 F (36.8 C)  TempSrc:  Oral Oral Oral  SpO2: 94% 100% 95% 95%  Weight:      Height:       Gen: lying in bed, no distress CV: RRR, no murmurs Pulm: CTAB, normal effort on room air Abd: Soft, non-distended, non-tender Ext: no edema  Assessment/Plan:  Active Problems:   Syncope  Mr. Deeg is a 78 yo M with hx of HTN and grief after recent loss of his son who presented with hypotension and syncope. He was admitted from 10/07/2018 to 10/09/2018 for the same problem. He was orthostatic during the admission, but this resolved with IVF. He continued to have poor PO intake after discharge and was found to be orthostatic again upon this admission.   Syncope - Still orthostatic this morning. His syncopal episodes in the setting of a negative cardiac workup and positive orthostatics responsive to fluid is consistent with orthostatic hypotension.  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 He does have new headaches, but no vision changes. His morning cortisol level is decreased at 1. High suspicion for either primary adrenal insufficiency or  secondary adrenal insufficiency in the setting of pituitary adenoma. TSH normal. Plan - Tele  - Cards following, appreciate recs. Patient has outpatient f/u with Dr. Virgina Jock scheduled for 3/19. - ACTH stim test today - ACTH level in the morning - Will hold off on midodrine at this time because the patient may require cortisol replacement. - Defer further MRI imaging to outpatient setting. - Patient will need f/u with PCP and endocrinology after discharge  Grief Reaction - Patient is mourning the death of his son. His grief may be contributing to his poor PO intake and orthostasis. Plan - Grief counseling and pharmacotherapy per PCP upon discharge  Dispo: Anticipated discharge in approximately 1-2 days.  Christle Nolting, Andree Elk, MD 10/16/2018, 6:35 AM Pager: [phone]

## 2018-10-16 NOTE — Progress Notes (Signed)
Recommend starting midodrine. Ordered and prescriptions sent. Okay to discharge from cardiology standpoint. He has follow up with me tomorrow.  [name], MD [facility] Pager: [phone] Office: [phone] If no answer Cell [phone]

## 2018-10-16 NOTE — Care Management Obs Status (Signed)
[facility] NOTIFICATION   Patient Details  Name: Julian Harris MRN: [id number] Date of Birth: March 13, 1941   Medicare Observation Status Notification Given:  Yes    Midge Minium RN, BSN, NCM-BC, ACM-RN [phone] 10/16/2018, 2:17 PM

## 2018-10-16 NOTE — Progress Notes (Signed)
Patient continues to complain about having to void frequently, ambulating to toilet without difficulty. Bladder scan shows >187. No bladder distention noted. Notified team via page.

## 2018-10-16 NOTE — Progress Notes (Signed)
Post void bladder scan completed output 143ml clear pale yellow urine with spec of blood. Patient complained of pain with insertion and removal of 51fr cath and scant blood noted at tip of cath with removal.

## 2018-10-16 NOTE — Progress Notes (Signed)
Patient ambulated in hallway for about 8 minutes. No decrease in BP, denies dizziness. Patient denies pain at this time. Patient voided 161ml clear pale yellow urine, bladder scan showed 375ml. Patient then ambulated to toilet and post void bladder scan showed 170in bladder. Patient educated to call for assistance prior to toileting.

## 2018-10-17 ENCOUNTER — Ambulatory Visit: Payer: Medicare Other | Admitting: Cardiology

## 2018-10-17 DIAGNOSIS — I951 Orthostatic hypotension: Secondary | ICD-10-CM

## 2018-10-17 DIAGNOSIS — F4320 Adjustment disorder, unspecified: Secondary | ICD-10-CM

## 2018-10-17 DIAGNOSIS — I10 Essential (primary) hypertension: Secondary | ICD-10-CM

## 2018-10-17 DIAGNOSIS — E2740 Unspecified adrenocortical insufficiency: Secondary | ICD-10-CM

## 2018-10-17 DIAGNOSIS — D352 Benign neoplasm of pituitary gland: Secondary | ICD-10-CM

## 2018-10-17 DIAGNOSIS — R350 Frequency of micturition: Secondary | ICD-10-CM

## 2018-10-17 LAB — ACTH STIMULATION, 3 TIME POINTS
Cortisol, 30 Min: 15 ug/dL
Cortisol, 60 Min: 17.3 ug/dL
Cortisol, Base: 2.1 ug/dL

## 2018-10-17 MED ORDER — HYDROCORTISONE 5 MG PO TABS: ORAL_TABLET | ORAL | 0 refills | Status: DC

## 2018-10-17 MED ORDER — HYDROCORTISONE 5 MG PO TABS
5.0000 mg | ORAL_TABLET | Freq: Every day | ORAL | Status: DC
  Filled 2018-10-17: qty 1

## 2018-10-17 MED ORDER — HYDROCORTISONE 10 MG PO TABS
10.0000 mg | ORAL_TABLET | Freq: Every day | ORAL | Status: AC
  Administered 2018-10-17: 10 mg via ORAL
  Filled 2018-10-17: qty 1

## 2018-10-17 NOTE — Progress Notes (Signed)
   Subjective: Overnight, Julian Harris endorsed urinary frequency. A post-void bladder scan showed 142ml of urine in the bladder for which he received an in-and-out cath. He feels better this morning. He says this is is not normally an issue for him at home.  No further syncopal episodes. He had a difficult time sleeping last night due to restless legs. Discussed plan for more blood work to figure out why his cortisol levels are low and starting hydrocortisone. All of his questions were answered.  Objective:  Vital signs in last 24 hours: Vitals:   10/16/18 1834 10/16/18 2259 10/17/18 0413 10/17/18 0417  BP: 130/88 110/84 100/84   Pulse: 85 83 78   Resp: 18 18 18    Temp: 98.1 F (36.7 C) 98.2 F (36.8 C) 98.6 F (37 C)   TempSrc: Axillary Oral Oral   SpO2: 98% 98% 96%   Weight:    92.9 kg  Height:       General: lying in bed in NAD CV: RRR; no murmurs Pulm: normal work of breathing; lungs CTAB Abd: BS+; abdomen soft, non-distended, non-tender Ext: no edema   Assessment/Plan:  Active Problems:   Syncope  Julian Harris is a 78 yo M with hx of HTN and grief after recent loss ofhisson who presented with hypotension andsyncope. He was admitted from 10/07/2018 to 10/09/2018 for the same problem. He was orthostatic during the admission, but this resolved with IVF. He continued to have poor PO intake after discharge and was found to be orthostatic again upon this admission.   Syncope - Orthostatic hypotension resolved after his third fluid bolus.  - Brain MRI showed a 35mm pituitary mass with suprasellar extension consistent with a macroadenoma.  - His morning cortisol level was decreased at 1. ACTH stim test was negative, indicating a primary adrenal insufficiency. If that is the case, the adrenal insufficiency is not likely related to the pituitary macroadenoma. Will repeat ACTH stim test today. Plan - Tele  - Repeat ACTH stim test today  - ACTH, prolactin, IGF, aldosterone, renin  levels pending - Start hydrocortisone 10mg  in the am and 5mg  in the pm - Defer further MRI imaging to outpatient setting. - Patient will need f/u with PCP and endocrinology after discharge  Urinary Frequency - Patient has endorsed urinary frequency throughout hospitalization. He was urinating frequently, getting out about 100cc at a time. Post-void bladder scan overnight showed a residual of 179ml. His symptoms are improved today. - Likely BPH and increased urine output with IVF Plan - Will avoid tamsulosin at this time given hypotension and syncope - Patient will need PCP f/u for this issue  Grief Reaction - Patient is mourning the death of his son. His grief may be contributing to his poor PO intake and orthostasis. Plan - Grief counseling and pharmacotherapy per PCP upon discharge  Dispo: Anticipated discharge today.  , Andree Elk, MD 10/17/2018, 6:28 AM Pager: [phone]

## 2018-10-17 NOTE — Discharge Summary (Signed)
Name: Julian Harris MRN: [id number] DOB: September 04, 1940 78 y.o. PCP: Janie Morning, DO  Date of Admission: 10/15/2018 11:33 AM Date of Discharge: 10/17/2018 Attending Physician: Dr. Dareen Piano  Discharge Diagnosis: 1. Orthostatic hypotension 2. Adrenal insufficiency 3. Pituitary macroadenoma 4. Urinary frequency 5. Grief reaction  Discharge Medications: Allergies as of 10/17/2018   No Known Allergies     Medication List    TAKE these medications   hydrocortisone 5 MG tablet Commonly known as:  CORTEF Take 2 tablets (10mg  total) in the morning with breakfast. Take 1 tablet (5mg  total) in the afternoon.   Vitamin B-12 2500 MCG Subl Place 2,500 mcg under the tongue daily after breakfast.   vitamin C 500 MG tablet Commonly known as:  ASCORBIC ACID Take 500-1,000 mg by mouth daily after breakfast.   Vitamin D3 125 MCG (5000 UT) Caps Take 5,000 Units by mouth daily after breakfast.       Disposition and follow-up:   JulianParker J Harris was discharged from [facility] in Good condition.  At the hospital follow up visit please address:  1. Adrenal insufficiency and pituitary macroadenoma - Patient started on hydrocortisone - Please f/u aldosterone, renin, ACTH, prolactin, and IGF levels - Please make appropriate referral to endocrinology   2. Urinary frequency - Consider starting finasteride if still symptomatic.  3. Grief reaction - Consider starting grief counseling and pharmacotherapy for depression  4.  Labs / imaging needed at time of follow-up: none  5.  Pending labs/ test needing follow-up: aldosterone, renin, ACTH, prolactin, and IGF  Follow-up Appointments: Follow-up Information    [name], MD On 10/17/2018.   Specialties:  Cardiology, Radiology Why:  2:00 PM Contact information: [place] [address] [place], [place], DO.   Specialty:  Family Medicine Why:  Wed March 25 at  11:am  Contact information: [address] [place] [address] [phone]           Hospital Course by problem list: 1. Orthostatic hypotension: Julian Harris is a 78 yo M with hx of HTN and grief after recent loss ofhisson who presented with hypotension andsyncope. He was admitted from 10/07/2018 to 10/09/2018 for the same problem. He had a normal cardiac workup. He was orthostatic during the admission, but this resolved with IVF.He continued to have poor PO intake after discharge and was found to be orthostatic again upon this admission.The orthostasis resolved with fluids and he had no episodes of syncope during hospitalization.  2. Adrenal insufficiency and pituitary macroadenoma: Brain MRI showed a 20mm pituitary mass with suprasellar extension consistent with a macroadenoma. No neurological deficits including vision loss. The patient's morning cortisol level was decreased. ACTH stim test was negative, indicating a primary adrenal insufficiency. If that is the case, the adrenal insufficiency is not likely related to the pituitary macroadenoma. Aldosterone, renin, ACTH, prolactin, and IGF levels were pending at discharge. He will need outpatient referral to endocrinology arranged by his PCP.   3. Urinary frequency: Patient endorsed urinary frequency throughout hospitalization after getting IVF. PVR on bladder scan was 187 and he received and in-and-out catheterization. His symptoms resolved. He may require workup for BPH in the future if symptoms recur. Would avoid tamsulosin until his pressures have stabilized on the hydrocortisone. Could consider finasteride.   5. Grief reaction: Patient is mourning the death of his son.His griefmay becontributing to his poor PO intake and orthostasis. Patient will  f/u with his PCP for management.   Discharge Vitals:   BP (!) 141/93 (BP Location: Left Arm)   Pulse 77   Temp 98.2 F (36.8 C) (Oral)   Resp 18   Ht 5\' 10"  (1.778 m)    Wt 92.9 kg   SpO2 100%   BMI 29.40 kg/m   Pertinent Labs, Studies, and Procedures:  CBC Latest Ref Rng & Units 10/15/2018 10/07/2018 01/11/2009  WBC 4.0 - 10.5 K/uL 5.7 8.1 -  Hemoglobin 13.0 - 17.0 g/dL 13.9 13.9 9.0(L)  Hematocrit 39.0 - 52.0 % 41.4 41.4 25.3(L)  Platelets 150 - 400 K/uL 231 231 -   CMP Latest Ref Rng & Units 10/16/2018 10/15/2018 10/07/2018  Glucose 70 - 99 mg/dL 97 100(H) 117(H)  BUN 8 - 23 mg/dL 5(L) 6(L) 11  Creatinine 0.61 - 1.24 mg/dL 1.05 1.06 1.22  Sodium 135 - 145 mmol/L 136 134(L) 137  Potassium 3.5 - 5.1 mmol/L 3.5 3.4(L) 3.5  Chloride 98 - 111 mmol/L 101 99 106  CO2 22 - 32 mmol/L 26 25 23   Calcium 8.9 - 10.3 mg/dL 9.2 9.5 9.3  Total Protein 6.0 - 8.3 g/dL - - -  Total Bilirubin 0.3 - 1.2 mg/dL - - -  Alkaline Phos 39 - 117 U/L - - -  AST 0 - 37 U/L - - -  ALT 0 - 53 U/L - - -   Am cortisol: 1  Brain MRI 10/15/2018 1. No acute intracranial abnormality. 2. Approximate 17 mm pituitary mass with suprasellar extension, most suggestive of a pituitary macro adenoma. Follow-up examination with nonemergent pituitary protocol MRI suggested for further characterization. 3. Otherwise normal brain MRI for age.  Discharge Instructions: Discharge Instructions    Call MD for:  extreme fatigue   Complete by:  As directed    Call MD for:  persistant dizziness or light-headedness   Complete by:  As directed    Diet general   Complete by:  As directed    Discharge instructions   Complete by:  As directed    Julian Harris were admitted to the hospital due to low blood pressure. Your levels of cortisol, which is a steroid produced by the adrenal glands is low. This is called adrenal insufficiency. This can cause low blood pressures and nausea. Start taking hydrocortisone (also called cortef) at home. Take 10mg  in the morning with breakfast and 5mg  in the afternoon after lunch. This medication should help your appetite increase as well. Try to drink water  regularly throughout the day as well.  Do not take midodrine.  We ordered an MRI due to your new headaches and found a mass called a pituitary macroadenoma. These tend to benign, but you will need more blood work and follow up to determine if you will need surgery to remove it. We will refer you to an endocrinologist that will follow you for this.   Please make a follow up appointment with Dr. Theda Sers within 1 week.   Please call us if you have any questions or concerns.  Thanks, Dr. Annie Paras   Increase activity slowly   Complete by:  As directed       Signed: Dorrell, Andree Elk, MD 10/17/2018, 2:22 PM   Pager: [phone]

## 2018-10-18 LAB — ALDOSTERONE + RENIN ACTIVITY W/ RATIO
ALDO / PRA Ratio: >71.9 — ABNORMAL HIGH (ref 0.0–30.0)
Aldosterone: 12 ng/dL (ref 0.0–30.0)
PRA LC/MS/MS: <0.167 ng/mL/hr — ABNORMAL LOW (ref 0.167–5.380)

## 2018-10-18 LAB — PROLACTIN: Prolactin: 27.2 ng/mL — ABNORMAL HIGH (ref 4.0–15.2)

## 2018-10-18 LAB — INSULIN-LIKE GROWTH FACTOR: Somatomedin C: 44 ng/mL (ref 37–172)

## 2018-10-18 LAB — ACTH: C206 ACTH: <1.5 pg/mL — ABNORMAL LOW (ref 7.2–63.3)

## 2018-10-22 DIAGNOSIS — R5383 Other fatigue: Secondary | ICD-10-CM | POA: Diagnosis not present

## 2018-10-22 DIAGNOSIS — Z6830 Body mass index (BMI) 30.0-30.9, adult: Secondary | ICD-10-CM | POA: Diagnosis not present

## 2018-10-22 DIAGNOSIS — E2740 Unspecified adrenocortical insufficiency: Secondary | ICD-10-CM | POA: Diagnosis not present

## 2018-10-22 DIAGNOSIS — D352 Benign neoplasm of pituitary gland: Secondary | ICD-10-CM | POA: Diagnosis not present

## 2018-10-22 DIAGNOSIS — E237 Disorder of pituitary gland, unspecified: Secondary | ICD-10-CM | POA: Diagnosis not present

## 2018-10-22 DIAGNOSIS — E038 Other specified hypothyroidism: Secondary | ICD-10-CM | POA: Diagnosis not present

## 2018-12-24 DIAGNOSIS — R5383 Other fatigue: Secondary | ICD-10-CM | POA: Diagnosis not present

## 2018-12-24 DIAGNOSIS — Z6832 Body mass index (BMI) 32.0-32.9, adult: Secondary | ICD-10-CM | POA: Diagnosis not present

## 2018-12-24 DIAGNOSIS — E038 Other specified hypothyroidism: Secondary | ICD-10-CM | POA: Diagnosis not present

## 2018-12-24 DIAGNOSIS — E2740 Unspecified adrenocortical insufficiency: Secondary | ICD-10-CM | POA: Diagnosis not present

## 2018-12-24 DIAGNOSIS — D352 Benign neoplasm of pituitary gland: Secondary | ICD-10-CM | POA: Diagnosis not present

## 2018-12-24 DIAGNOSIS — E237 Disorder of pituitary gland, unspecified: Secondary | ICD-10-CM | POA: Diagnosis not present

## 2018-12-24 DIAGNOSIS — Z7189 Other specified counseling: Secondary | ICD-10-CM | POA: Diagnosis not present

## 2019-02-20 DIAGNOSIS — H2513 Age-related nuclear cataract, bilateral: Secondary | ICD-10-CM | POA: Diagnosis not present

## 2019-02-28 ENCOUNTER — Other Ambulatory Visit: Payer: Self-pay

## 2019-03-26 DIAGNOSIS — E237 Disorder of pituitary gland, unspecified: Secondary | ICD-10-CM | POA: Diagnosis not present

## 2019-03-26 DIAGNOSIS — D352 Benign neoplasm of pituitary gland: Secondary | ICD-10-CM | POA: Diagnosis not present

## 2019-03-26 DIAGNOSIS — Z6833 Body mass index (BMI) 33.0-33.9, adult: Secondary | ICD-10-CM | POA: Diagnosis not present

## 2019-03-26 DIAGNOSIS — Z7189 Other specified counseling: Secondary | ICD-10-CM | POA: Diagnosis not present

## 2019-03-26 DIAGNOSIS — E038 Other specified hypothyroidism: Secondary | ICD-10-CM | POA: Diagnosis not present

## 2019-03-26 DIAGNOSIS — E2740 Unspecified adrenocortical insufficiency: Secondary | ICD-10-CM | POA: Diagnosis not present

## 2019-05-15 ENCOUNTER — Other Ambulatory Visit: Payer: Self-pay | Admitting: Internal Medicine

## 2019-05-15 DIAGNOSIS — E2740 Unspecified adrenocortical insufficiency: Secondary | ICD-10-CM

## 2019-05-29 DIAGNOSIS — E559 Vitamin D deficiency, unspecified: Secondary | ICD-10-CM | POA: Diagnosis not present

## 2019-05-29 DIAGNOSIS — Z125 Encounter for screening for malignant neoplasm of prostate: Secondary | ICD-10-CM | POA: Diagnosis not present

## 2019-05-29 DIAGNOSIS — E7800 Pure hypercholesterolemia, unspecified: Secondary | ICD-10-CM | POA: Diagnosis not present

## 2019-05-29 DIAGNOSIS — I10 Essential (primary) hypertension: Secondary | ICD-10-CM | POA: Diagnosis not present

## 2019-05-29 DIAGNOSIS — R7309 Other abnormal glucose: Secondary | ICD-10-CM | POA: Diagnosis not present

## 2019-05-29 DIAGNOSIS — Z0000 Encounter for general adult medical examination without abnormal findings: Secondary | ICD-10-CM | POA: Diagnosis not present

## 2019-06-04 ENCOUNTER — Ambulatory Visit
Admission: RE | Admit: 2019-06-04 | Discharge: 2019-06-04 | Disposition: A | Discharge status: Home / Self Care | Payer: Medicare Other | Source: Ambulatory Visit | Attending: Internal Medicine | Admitting: Internal Medicine

## 2019-06-04 ENCOUNTER — Other Ambulatory Visit: Payer: Self-pay

## 2019-06-04 DIAGNOSIS — R220 Localized swelling, mass and lump, head: Secondary | ICD-10-CM | POA: Diagnosis not present

## 2019-06-04 DIAGNOSIS — E2740 Unspecified adrenocortical insufficiency: Secondary | ICD-10-CM

## 2019-06-04 MED ORDER — GADOBENATE DIMEGLUMINE 529 MG/ML IV SOLN
20.0000 mL | Freq: Once | INTRAVENOUS | Status: AC | PRN
  Administered 2019-06-04: 10 mL via INTRAVENOUS

## 2019-06-05 DIAGNOSIS — E2740 Unspecified adrenocortical insufficiency: Secondary | ICD-10-CM | POA: Diagnosis not present

## 2019-06-05 DIAGNOSIS — Z862 Personal history of diseases of the blood and blood-forming organs and certain disorders involving the immune mechanism: Secondary | ICD-10-CM | POA: Diagnosis not present

## 2019-06-05 DIAGNOSIS — E559 Vitamin D deficiency, unspecified: Secondary | ICD-10-CM | POA: Diagnosis not present

## 2019-06-05 DIAGNOSIS — E038 Other specified hypothyroidism: Secondary | ICD-10-CM | POA: Diagnosis not present

## 2019-06-05 DIAGNOSIS — Z8679 Personal history of other diseases of the circulatory system: Secondary | ICD-10-CM | POA: Diagnosis not present

## 2019-06-05 DIAGNOSIS — Z0000 Encounter for general adult medical examination without abnormal findings: Secondary | ICD-10-CM | POA: Diagnosis not present

## 2019-06-05 DIAGNOSIS — D352 Benign neoplasm of pituitary gland: Secondary | ICD-10-CM | POA: Diagnosis not present

## 2019-09-08 DIAGNOSIS — N401 Enlarged prostate with lower urinary tract symptoms: Secondary | ICD-10-CM | POA: Diagnosis not present

## 2019-09-08 DIAGNOSIS — N5201 Erectile dysfunction due to arterial insufficiency: Secondary | ICD-10-CM | POA: Diagnosis not present

## 2019-09-08 DIAGNOSIS — R351 Nocturia: Secondary | ICD-10-CM | POA: Diagnosis not present

## 2019-09-22 DIAGNOSIS — E237 Disorder of pituitary gland, unspecified: Secondary | ICD-10-CM | POA: Diagnosis not present

## 2019-09-22 DIAGNOSIS — E038 Other specified hypothyroidism: Secondary | ICD-10-CM | POA: Diagnosis not present

## 2019-09-25 DIAGNOSIS — Z7189 Other specified counseling: Secondary | ICD-10-CM | POA: Diagnosis not present

## 2019-09-25 DIAGNOSIS — E2740 Unspecified adrenocortical insufficiency: Secondary | ICD-10-CM | POA: Diagnosis not present

## 2019-09-25 DIAGNOSIS — E237 Disorder of pituitary gland, unspecified: Secondary | ICD-10-CM | POA: Diagnosis not present

## 2019-09-25 DIAGNOSIS — Z6833 Body mass index (BMI) 33.0-33.9, adult: Secondary | ICD-10-CM | POA: Diagnosis not present

## 2019-09-25 DIAGNOSIS — E038 Other specified hypothyroidism: Secondary | ICD-10-CM | POA: Diagnosis not present

## 2019-09-25 DIAGNOSIS — D352 Benign neoplasm of pituitary gland: Secondary | ICD-10-CM | POA: Diagnosis not present

## 2019-10-29 DIAGNOSIS — G4700 Insomnia, unspecified: Secondary | ICD-10-CM | POA: Diagnosis not present

## 2019-10-29 DIAGNOSIS — R42 Dizziness and giddiness: Secondary | ICD-10-CM | POA: Diagnosis not present

## 2019-11-07 DIAGNOSIS — R42 Dizziness and giddiness: Secondary | ICD-10-CM | POA: Diagnosis not present

## 2019-11-07 DIAGNOSIS — G2581 Restless legs syndrome: Secondary | ICD-10-CM | POA: Diagnosis not present

## 2019-11-07 DIAGNOSIS — G4700 Insomnia, unspecified: Secondary | ICD-10-CM | POA: Diagnosis not present

## 2019-11-20 DIAGNOSIS — D352 Benign neoplasm of pituitary gland: Secondary | ICD-10-CM | POA: Diagnosis not present

## 2019-11-20 DIAGNOSIS — E038 Other specified hypothyroidism: Secondary | ICD-10-CM | POA: Diagnosis not present

## 2019-11-27 DIAGNOSIS — Z6833 Body mass index (BMI) 33.0-33.9, adult: Secondary | ICD-10-CM | POA: Diagnosis not present

## 2019-11-27 DIAGNOSIS — E2740 Unspecified adrenocortical insufficiency: Secondary | ICD-10-CM | POA: Diagnosis not present

## 2019-11-27 DIAGNOSIS — Z7189 Other specified counseling: Secondary | ICD-10-CM | POA: Diagnosis not present

## 2019-11-27 DIAGNOSIS — D352 Benign neoplasm of pituitary gland: Secondary | ICD-10-CM | POA: Diagnosis not present

## 2019-11-27 DIAGNOSIS — R7309 Other abnormal glucose: Secondary | ICD-10-CM | POA: Diagnosis not present

## 2019-11-27 DIAGNOSIS — E237 Disorder of pituitary gland, unspecified: Secondary | ICD-10-CM | POA: Diagnosis not present

## 2019-11-27 DIAGNOSIS — E038 Other specified hypothyroidism: Secondary | ICD-10-CM | POA: Diagnosis not present

## 2020-03-08 DIAGNOSIS — I959 Hypotension, unspecified: Secondary | ICD-10-CM | POA: Diagnosis not present

## 2020-03-08 DIAGNOSIS — R231 Pallor: Secondary | ICD-10-CM | POA: Diagnosis not present

## 2020-03-08 DIAGNOSIS — R55 Syncope and collapse: Secondary | ICD-10-CM | POA: Diagnosis not present

## 2020-04-12 DIAGNOSIS — Z20828 Contact with and (suspected) exposure to other viral communicable diseases: Secondary | ICD-10-CM | POA: Diagnosis not present

## 2020-04-21 DIAGNOSIS — E038 Other specified hypothyroidism: Secondary | ICD-10-CM | POA: Diagnosis not present

## 2020-04-21 DIAGNOSIS — E2740 Unspecified adrenocortical insufficiency: Secondary | ICD-10-CM | POA: Diagnosis not present

## 2020-04-21 DIAGNOSIS — R7303 Prediabetes: Secondary | ICD-10-CM | POA: Diagnosis not present

## 2020-04-23 ENCOUNTER — Ambulatory Visit: Payer: Medicare Other | Admitting: Cardiology

## 2020-04-23 ENCOUNTER — Other Ambulatory Visit: Payer: Self-pay

## 2020-04-23 ENCOUNTER — Encounter: Payer: Self-pay | Admitting: Cardiology

## 2020-04-23 VITALS — BP 168/108 | HR 74 | Resp 16 | Ht 70.0 in | Wt 202.3 lb

## 2020-04-23 DIAGNOSIS — D352 Benign neoplasm of pituitary gland: Secondary | ICD-10-CM | POA: Diagnosis not present

## 2020-04-23 DIAGNOSIS — R0609 Other forms of dyspnea: Secondary | ICD-10-CM | POA: Diagnosis not present

## 2020-04-23 DIAGNOSIS — R0600 Dyspnea, unspecified: Secondary | ICD-10-CM

## 2020-04-23 DIAGNOSIS — I951 Orthostatic hypotension: Secondary | ICD-10-CM | POA: Insufficient documentation

## 2020-04-23 DIAGNOSIS — E2740 Unspecified adrenocortical insufficiency: Secondary | ICD-10-CM | POA: Insufficient documentation

## 2020-04-23 DIAGNOSIS — E2749 Other adrenocortical insufficiency: Secondary | ICD-10-CM | POA: Diagnosis not present

## 2020-04-23 NOTE — Progress Notes (Signed)
Follow up visit  Subjective:   Julian Harris, male    DOB: 07-06-1941, 79 y.o.   MRN: [id number]     HPI  Chief Complaint  Patient presents with  . Hypertension  . Hospitalization Follow-up    79 year old Caucasian male with adrenal insufficiency, pituitary microadenoma, now with exertional dyspnea and lightheadedness  Patient was diagnosed with adrenal insufficiency in March 2020.  He is seeing endocrinologist Dr. Garnet Koyanagi for management of the same.  He is currently on prednisone 5 mg daily, and levothyroxine 25 mcg daily.  He has had serial MRIs, with no increase in the size of pituitary macroadenoma.  His orthostatic hypotension has significantly improved.  He was unable to even stand back in March 2020, this is since resolved.  However, he continues to have episodes of lightheadedness, particularly during the early part of the day.  He also notices exertional dyspnea symptoms.  He tells me that he has gained several pounds in the last year or so, while on prednisone.  He denies any chest pain, presyncope or syncope.  Also denies any significant orthopnea, PND.  Current Outpatient Medications on File Prior to Visit  Medication Sig Dispense Refill  . levothyroxine (SYNTHROID) 25 MCG tablet Take 25 mcg by mouth daily.    . predniSONE (DELTASONE) 5 MG tablet Take 5 mg by mouth daily.    Marland Kitchen rOPINIRole (REQUIP) 1 MG tablet Take 1 mg by mouth every 3 (three) days.    . tamsulosin (FLOMAX) 0.4 MG CAPS capsule Take 1 capsule by mouth every evening.     No current facility-administered medications on file prior to visit.    Cardiovascular & other pertient studies:  EKG 04/23/2020: Sinus rhythm 68 bpm Low voltage, otherwise normal EKG  MRI brain 06/2019: 1. 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 Suspected early invasion of the right cavernous sinus, and mild bulging into the suprasellar cistern which  effaces the optic chiasm. 2. Otherwise normal for age MRI appearance of the brain.  Echocardiogram 10/15/2018: 1. The left ventricle has hyperdynamic systolic function, with an ejection fraction of >65%. The cavity size was normal. There is mildly increased left ventricular wall thickness. Left ventricular diastolic Doppler parameters are consistent with  impaired relaxation. 2. Mild thickening of the mitral valve leaflet. There is moderate mitral annular calcification present. 3. The aortic valve is tricuspid Moderate calcification of the aortic valve. 4. Vigorous LV systolic function; mild diastolic dysfunction; mild LVH with proximal septal thickening; calcified aortic valve with no sigificant AS by doppler.   Recent labs: 04/21/2020: BUN 17  05/2019: Chol 195, TG 109, HDL 59, LDL 117 TSH 0.3  3//2020: Glucose 87, BUN/Cr 5/1.05. EGFR >60. Na/K 136/3.5.  H/H 13.9/40.4. MCV 94. Platelets 231  Results for Julian Harris, Julian Harris (MRN [id number]) as of 04/23/2020 11:24  Ref. Range 10/16/2018 14:28 10/17/2018 08:57  Cortisol, Base Latest Units: ug/dL  2.1  Cortisol, 30 Min Latest Units: ug/dL  15.0  Cortisol, 60 Min Latest Units: ug/dL  17.3  ALDOSTERONE Latest Ref Range: 0.0 - 30.0 ng/dL 12.0   PRA LC/MS/MS Latest Ref Range: 0.167 - 5.380 ng/mL/hr <0.167 (L)   ALDO / PRA Ratio Latest Ref Range: 0.0 - 30.0  >71.9 (H)      Review of Systems  Cardiovascular: Positive for dyspnea on exertion. Negative for chest pain, leg swelling, palpitations and syncope.  Neurological: Positive for dizziness and light-headedness.  Vitals:   04/23/20 0941  BP: (!) 168/108  Pulse: 74  Resp: 16  SpO2: 99%   Orthostatic VS for the past 72 hrs (Last 3 readings):  Orthostatic BP Patient Position BP Location Cuff Size Orthostatic Pulse  04/23/20 1011 (!) 152/107 Standing Left Arm Normal 85  04/23/20 1010 (!) 170/110 Sitting Left Arm Normal 68  04/23/20 1009 (!) 155/115 Supine Left Arm Normal  73     Body mass index is 29.03 kg/m. Filed Weights   04/23/20 0941  Weight: 202 lb 4.8 oz (91.8 kg)     Objective:   Physical Exam Vitals and nursing note reviewed.  Constitutional:      General: He is not in acute distress.    Appearance: He is obese.  Neck:     Vascular: No JVD.  Cardiovascular:     Rate and Rhythm: Normal rate and regular rhythm.     Heart sounds: Normal heart sounds. No murmur heard.   Pulmonary:     Effort: Pulmonary effort is normal.     Breath sounds: Normal breath sounds. No wheezing or rales.           Assessment & Recommendations:   79 year old Caucasian male with adrenal insufficiency, pituitary microadenoma, now with exertional dyspnea and lightheadedness  Exertional dyspnea: Likely multifactorial, including obesity, deconditioning.  Low suspicion for angina equivalent at this time.  We will repeat an echocardiogram, previous echocardiogram in March 2020 was unremarkable.  If his symptoms persist in spite of losing weight, will consider exercise nuclear stress test down the road.  Lightheadedness: Orthostatics negative today. Blood pressure elevated in the office., but reportedly much lower at home.  Recommend remote patient monitoring evaluate for any labile hypertension, or orthostatic hypotension to explain his symptoms.  No change made to his medications today.  Pituitary microadenoma: Continue management as per Dr. Festus Holts recommendations.      [name], MD Pager: [phone] Office: [phone]

## 2020-04-28 DIAGNOSIS — E237 Disorder of pituitary gland, unspecified: Secondary | ICD-10-CM | POA: Diagnosis not present

## 2020-04-28 DIAGNOSIS — E038 Other specified hypothyroidism: Secondary | ICD-10-CM | POA: Diagnosis not present

## 2020-04-28 DIAGNOSIS — R7303 Prediabetes: Secondary | ICD-10-CM | POA: Diagnosis not present

## 2020-04-28 DIAGNOSIS — Z6833 Body mass index (BMI) 33.0-33.9, adult: Secondary | ICD-10-CM | POA: Diagnosis not present

## 2020-04-28 DIAGNOSIS — D352 Benign neoplasm of pituitary gland: Secondary | ICD-10-CM | POA: Diagnosis not present

## 2020-04-28 DIAGNOSIS — E2740 Unspecified adrenocortical insufficiency: Secondary | ICD-10-CM | POA: Diagnosis not present

## 2020-04-30 ENCOUNTER — Other Ambulatory Visit: Payer: Self-pay

## 2020-04-30 ENCOUNTER — Ambulatory Visit: Payer: Medicare Other

## 2020-04-30 DIAGNOSIS — R0600 Dyspnea, unspecified: Secondary | ICD-10-CM

## 2020-04-30 DIAGNOSIS — R0609 Other forms of dyspnea: Secondary | ICD-10-CM | POA: Diagnosis not present

## 2020-05-03 ENCOUNTER — Telehealth: Payer: Self-pay

## 2020-05-03 NOTE — Telephone Encounter (Signed)
Correction: Patient spoke with Pharmacist, Manuela Schwartz. No further questions.

## 2020-05-03 NOTE — Telephone Encounter (Signed)
I do not think these symptoms are related to any findings on the echocardiogram. He has had known h/o orthostatic hypotension-related to his pituitary macroadenoma. I had recommended blood pressure monitoring to look for any recurrence of orthostatic hypotension. Will he be able to either pick up the monitor from [facility], or provide [place] his home BP monitoring data on his own monitor?  Thanks MJP

## 2020-05-03 NOTE — Telephone Encounter (Signed)
Patient spoke with Dr. Virgina Jock. No further questions.

## 2020-05-03 NOTE — Telephone Encounter (Signed)
Relayed information about Echo results to patient. Patient voiced understanding, however patient expressed anxiety due to worsening lightheadedness in the mornings. Patient is fearful to complete ADL's because he feels he may fall. Symptoms go away throughout the day and have gotten worse in the past month. Patient stated he has been diagnosed with a brain tumor and will follow up to get a repeat MRI to check for any changes. Patient would like to know if any of these symptoms could also be cardiac related. Please advise. Thanks!

## 2020-05-03 NOTE — Telephone Encounter (Signed)
Called and reviewed with pt. Pt reports that his home BP readings on his monitors have been stable and controlled. Recent BP readings of 122/87 HR 68, 122/83 HR 77, 138/100, 130/87. Most of his recent BP readings have been ranging from 120-130s/80s. Recent OV with endocrinology and only recent medications changes include increased prednisone dose to 7.5 mg daily and synthroid 75 mcg daily.   Reports that his symptoms are more troublesome in the mornings and get more prominent with increased activity. Pt denies any association between postural changes and associated symptoms. Symptoms significantly improve around mid-afternoon and completely resolve later in the day. Denies seeing any hypotensive BP readings over the past week when he checks his BP readings. Denies any recent falls or injuries.   Pt currently doesn't have a neurologist and seeking a referral to neurologist in order to better identify the causes of his symptoms and how to effectively manage his symptoms.

## 2020-05-03 NOTE — Telephone Encounter (Signed)
-----   Message from [facility], MD sent at 05/02/2020  9:18 PM EDT ----- Minimal changes compared to previous echocardiogram in 09/2018. Mild leakiness of aortic valve and mild thickening of mitral valve. Neither changes should explain your symptoms. Recommend continued weight loss efforts, as you are doing.  Thanks MJP

## 2020-05-03 NOTE — Progress Notes (Signed)
Done

## 2020-05-04 NOTE — Addendum Note (Signed)
Addended by: Manuela Schwartz T on: 05/04/2020 02:59 PM   Modules accepted: Orders

## 2020-05-11 ENCOUNTER — Other Ambulatory Visit: Payer: Self-pay | Admitting: Internal Medicine

## 2020-05-11 DIAGNOSIS — D352 Benign neoplasm of pituitary gland: Secondary | ICD-10-CM

## 2020-05-30 ENCOUNTER — Ambulatory Visit
Admission: RE | Admit: 2020-05-30 | Discharge: 2020-05-30 | Disposition: A | Discharge status: Home / Self Care | Payer: Medicare Other | Source: Ambulatory Visit | Attending: Internal Medicine | Admitting: Internal Medicine

## 2020-05-30 DIAGNOSIS — D352 Benign neoplasm of pituitary gland: Secondary | ICD-10-CM

## 2020-05-30 DIAGNOSIS — I10 Essential (primary) hypertension: Secondary | ICD-10-CM | POA: Diagnosis not present

## 2020-06-07 DIAGNOSIS — Z0000 Encounter for general adult medical examination without abnormal findings: Secondary | ICD-10-CM | POA: Diagnosis not present

## 2020-06-07 DIAGNOSIS — E7800 Pure hypercholesterolemia, unspecified: Secondary | ICD-10-CM | POA: Diagnosis not present

## 2020-06-07 DIAGNOSIS — E559 Vitamin D deficiency, unspecified: Secondary | ICD-10-CM | POA: Diagnosis not present

## 2020-06-07 DIAGNOSIS — D352 Benign neoplasm of pituitary gland: Secondary | ICD-10-CM | POA: Diagnosis not present

## 2020-06-07 DIAGNOSIS — R7309 Other abnormal glucose: Secondary | ICD-10-CM | POA: Diagnosis not present

## 2020-06-07 DIAGNOSIS — I10 Essential (primary) hypertension: Secondary | ICD-10-CM | POA: Diagnosis not present

## 2020-06-07 DIAGNOSIS — E038 Other specified hypothyroidism: Secondary | ICD-10-CM | POA: Diagnosis not present

## 2020-06-07 DIAGNOSIS — Z862 Personal history of diseases of the blood and blood-forming organs and certain disorders involving the immune mechanism: Secondary | ICD-10-CM | POA: Diagnosis not present

## 2020-06-10 DIAGNOSIS — E237 Disorder of pituitary gland, unspecified: Secondary | ICD-10-CM | POA: Diagnosis not present

## 2020-06-10 DIAGNOSIS — E038 Other specified hypothyroidism: Secondary | ICD-10-CM | POA: Diagnosis not present

## 2020-06-10 DIAGNOSIS — Z6833 Body mass index (BMI) 33.0-33.9, adult: Secondary | ICD-10-CM | POA: Diagnosis not present

## 2020-06-10 DIAGNOSIS — R7303 Prediabetes: Secondary | ICD-10-CM | POA: Diagnosis not present

## 2020-06-10 DIAGNOSIS — E2740 Unspecified adrenocortical insufficiency: Secondary | ICD-10-CM | POA: Diagnosis not present

## 2020-06-10 DIAGNOSIS — D352 Benign neoplasm of pituitary gland: Secondary | ICD-10-CM | POA: Diagnosis not present

## 2020-06-14 DIAGNOSIS — R7401 Elevation of levels of liver transaminase levels: Secondary | ICD-10-CM | POA: Diagnosis not present

## 2020-06-14 DIAGNOSIS — E237 Disorder of pituitary gland, unspecified: Secondary | ICD-10-CM | POA: Diagnosis not present

## 2020-06-14 DIAGNOSIS — G2581 Restless legs syndrome: Secondary | ICD-10-CM | POA: Diagnosis not present

## 2020-06-14 DIAGNOSIS — N400 Enlarged prostate without lower urinary tract symptoms: Secondary | ICD-10-CM | POA: Diagnosis not present

## 2020-06-14 DIAGNOSIS — R7309 Other abnormal glucose: Secondary | ICD-10-CM | POA: Diagnosis not present

## 2020-06-14 DIAGNOSIS — Z0000 Encounter for general adult medical examination without abnormal findings: Secondary | ICD-10-CM | POA: Diagnosis not present

## 2020-06-29 DIAGNOSIS — T169XXA Foreign body in ear, unspecified ear, initial encounter: Secondary | ICD-10-CM | POA: Diagnosis not present

## 2020-06-29 DIAGNOSIS — I10 Essential (primary) hypertension: Secondary | ICD-10-CM | POA: Diagnosis not present

## 2020-07-21 ENCOUNTER — Other Ambulatory Visit: Payer: Self-pay

## 2020-07-21 ENCOUNTER — Ambulatory Visit: Payer: Medicare Other | Admitting: Cardiology

## 2020-07-21 ENCOUNTER — Encounter: Payer: Self-pay | Admitting: Cardiology

## 2020-07-21 VITALS — BP 153/105 | HR 77 | Resp 16 | Ht 70.0 in | Wt 232.0 lb

## 2020-07-21 DIAGNOSIS — I10 Essential (primary) hypertension: Secondary | ICD-10-CM

## 2020-07-21 DIAGNOSIS — D352 Benign neoplasm of pituitary gland: Secondary | ICD-10-CM

## 2020-07-21 MED ORDER — LOSARTAN POTASSIUM 25 MG PO TABS: 25.0000 mg | ORAL_TABLET | Freq: Every day | ORAL | 3 refills | Status: DC

## 2020-07-21 NOTE — Progress Notes (Signed)
Follow up visit  Subjective:   Julian Harris, male    DOB: 1940-12-29, 79 y.o.   MRN: [id number]     HPI  Chief Complaint  Patient presents with  . Hypertension  . Follow-up    79 year old Caucasian male with adrenal insufficiency, pituitary microadenoma, now with exertional dyspnea and lightheadedness  Patient continues to have lightheadedness. Blood pressure remains elevated.   OV 03/2020: Patient was diagnosed with adrenal insufficiency in March 2020.  He is seeing endocrinologist Dr. Garnet Koyanagi for management of the same.  He is currently on prednisone 5 mg daily, and levothyroxine 25 mcg daily.  He has had serial MRIs, with no increase in the size of pituitary macroadenoma.  His orthostatic hypotension has significantly improved.  He was unable to even stand back in March 2020, this is since resolved.  However, he continues to have episodes of lightheadedness, particularly during the early part of the day.  He also notices exertional dyspnea symptoms.  He tells me that he has gained several pounds in the last year or so, while on prednisone.  He denies any chest pain, presyncope or syncope.  Also denies any significant orthopnea, PND.  Current Outpatient Medications on File Prior to Visit  Medication Sig Dispense Refill  . levothyroxine (SYNTHROID) 75 MCG tablet Take 75 mcg by mouth daily.    . predniSONE (DELTASONE) 5 MG tablet Take 5 mg by mouth 2 (two) times daily with a meal.    . rOPINIRole (REQUIP) 1 MG tablet Take 1 mg by mouth at bedtime. Taking 1-3 hrs before bedtime.    . tamsulosin (FLOMAX) 0.4 MG CAPS capsule Take 1 capsule by mouth every evening.     No current facility-administered medications on file prior to visit.    Cardiovascular & other pertient studies:  Echocardiogram 04/30/2020:  Study Quality: Technically difficult study.  Normal LV systolic function with visual EF 60-65%. Left ventricle cavity  is normal in size. Normal global wall motion. Normal  diastolic filling  pattern, normal LAP. Calculated EF 65%.  Mild (Grade I) aortic regurgitation.  Native mitral valve with trace regurgitation. Mild calcification of the  mitral valve annulus. Mild mitral valve stenosis.  Compared to prior study dated 10/15/2018 AR and MS are new.   EKG 04/23/2020: Sinus rhythm 68 bpm Low voltage, otherwise normal EKG  MRI brain 06/2019: 1. 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 Suspected early invasion of the right cavernous sinus, and mild bulging into the suprasellar cistern which effaces the optic chiasm. 2. Otherwise normal for age MRI appearance of the brain.   Recent labs: 04/21/2020: BUN 17  05/2019: Chol 195, TG 109, HDL 59, LDL 117 TSH 0.3  3//2020: Glucose 87, BUN/Cr 5/1.05. EGFR >60. Na/K 136/3.5.  H/H 13.9/40.4. MCV 94. Platelets 231   Review of Systems  Cardiovascular: Positive for dyspnea on exertion. Negative for chest pain, leg swelling, palpitations and syncope.  Neurological: Positive for dizziness and light-headedness.         Vitals:   07/21/20 1525 07/21/20 1529  BP: (!) 159/104 (!) 153/105  Pulse: 88 77  Resp: 16   SpO2: 96% 98%     Body mass index is 33.29 kg/m. Filed Weights   07/21/20 1525  Weight: 232 lb (105.2 kg)     Objective:   Physical Exam Vitals and nursing note reviewed.  Constitutional:      General: He is not in acute  distress.    Appearance: He is obese.  Neck:     Vascular: No JVD.  Cardiovascular:     Rate and Rhythm: Normal rate and regular rhythm.     Heart sounds: Normal heart sounds. No murmur heard.   Pulmonary:     Effort: Pulmonary effort is normal.     Breath sounds: Normal breath sounds. No wheezing or rales.           Assessment & Recommendations:   79 year old Caucasian male with adrenal insufficiency, pituitary microadenoma, now with exertional dyspnea and  lightheadedness  Exertional dyspnea: Likely multifactorial, including obesity, deconditioning.  Low suspicion for angina equivalent at this time.  No severe abnormalities on echocardiogram (12/2019). If his symptoms persist in spite of losing weight, will consider exercise nuclear stress test down the road.  Lightheadedness: Suspect this may be related to hypertension. Given his h/o orthostatic hypotension, I will try low dose losartan 25 mg daily.  Pituitary macroadenoma: Continue management as per Dr. Festus Holts recommendations.  F/u in 4 weeks     Julian Mormon, MD Pager: [phone] Office: [phone]

## 2020-07-26 ENCOUNTER — Ambulatory Visit: Payer: Medicare Other | Admitting: Cardiology

## 2020-08-25 ENCOUNTER — Ambulatory Visit: Payer: Medicare Other | Admitting: Cardiology

## 2020-09-30 DIAGNOSIS — I10 Essential (primary) hypertension: Secondary | ICD-10-CM | POA: Diagnosis not present

## 2020-09-30 DIAGNOSIS — R7309 Other abnormal glucose: Secondary | ICD-10-CM | POA: Diagnosis not present

## 2020-09-30 DIAGNOSIS — R7401 Elevation of levels of liver transaminase levels: Secondary | ICD-10-CM | POA: Diagnosis not present

## 2020-09-30 DIAGNOSIS — E2740 Unspecified adrenocortical insufficiency: Secondary | ICD-10-CM | POA: Diagnosis not present

## 2020-10-07 DIAGNOSIS — Z6834 Body mass index (BMI) 34.0-34.9, adult: Secondary | ICD-10-CM | POA: Diagnosis not present

## 2020-10-07 DIAGNOSIS — E2740 Unspecified adrenocortical insufficiency: Secondary | ICD-10-CM | POA: Diagnosis not present

## 2020-10-07 DIAGNOSIS — R7303 Prediabetes: Secondary | ICD-10-CM | POA: Diagnosis not present

## 2020-10-07 DIAGNOSIS — E038 Other specified hypothyroidism: Secondary | ICD-10-CM | POA: Diagnosis not present

## 2020-10-07 DIAGNOSIS — D352 Benign neoplasm of pituitary gland: Secondary | ICD-10-CM | POA: Diagnosis not present

## 2020-10-07 DIAGNOSIS — E237 Disorder of pituitary gland, unspecified: Secondary | ICD-10-CM | POA: Diagnosis not present

## 2020-10-07 DIAGNOSIS — E230 Hypopituitarism: Secondary | ICD-10-CM | POA: Diagnosis not present

## 2020-11-02 DIAGNOSIS — R351 Nocturia: Secondary | ICD-10-CM | POA: Diagnosis not present

## 2020-11-02 DIAGNOSIS — N5201 Erectile dysfunction due to arterial insufficiency: Secondary | ICD-10-CM | POA: Diagnosis not present

## 2020-11-02 DIAGNOSIS — N401 Enlarged prostate with lower urinary tract symptoms: Secondary | ICD-10-CM | POA: Diagnosis not present

## 2020-12-07 DIAGNOSIS — E038 Other specified hypothyroidism: Secondary | ICD-10-CM | POA: Diagnosis not present

## 2020-12-07 DIAGNOSIS — I10 Essential (primary) hypertension: Secondary | ICD-10-CM | POA: Diagnosis not present

## 2020-12-14 DIAGNOSIS — H2513 Age-related nuclear cataract, bilateral: Secondary | ICD-10-CM | POA: Diagnosis not present

## 2020-12-16 DIAGNOSIS — E875 Hyperkalemia: Secondary | ICD-10-CM | POA: Diagnosis not present

## 2021-01-20 DIAGNOSIS — E237 Disorder of pituitary gland, unspecified: Secondary | ICD-10-CM | POA: Diagnosis not present

## 2021-01-20 DIAGNOSIS — E2740 Unspecified adrenocortical insufficiency: Secondary | ICD-10-CM | POA: Diagnosis not present

## 2021-01-20 DIAGNOSIS — R7309 Other abnormal glucose: Secondary | ICD-10-CM | POA: Diagnosis not present

## 2021-01-20 DIAGNOSIS — E038 Other specified hypothyroidism: Secondary | ICD-10-CM | POA: Diagnosis not present

## 2021-04-07 DIAGNOSIS — E237 Disorder of pituitary gland, unspecified: Secondary | ICD-10-CM | POA: Diagnosis not present

## 2021-04-07 DIAGNOSIS — R7309 Other abnormal glucose: Secondary | ICD-10-CM | POA: Diagnosis not present

## 2021-04-07 DIAGNOSIS — E038 Other specified hypothyroidism: Secondary | ICD-10-CM | POA: Diagnosis not present

## 2021-04-14 DIAGNOSIS — E2740 Unspecified adrenocortical insufficiency: Secondary | ICD-10-CM | POA: Diagnosis not present

## 2021-04-14 DIAGNOSIS — Z6834 Body mass index (BMI) 34.0-34.9, adult: Secondary | ICD-10-CM | POA: Diagnosis not present

## 2021-04-14 DIAGNOSIS — E237 Disorder of pituitary gland, unspecified: Secondary | ICD-10-CM | POA: Diagnosis not present

## 2021-04-14 DIAGNOSIS — E230 Hypopituitarism: Secondary | ICD-10-CM | POA: Diagnosis not present

## 2021-04-14 DIAGNOSIS — D352 Benign neoplasm of pituitary gland: Secondary | ICD-10-CM | POA: Diagnosis not present

## 2021-04-14 DIAGNOSIS — R7303 Prediabetes: Secondary | ICD-10-CM | POA: Diagnosis not present

## 2021-04-14 DIAGNOSIS — E038 Other specified hypothyroidism: Secondary | ICD-10-CM | POA: Diagnosis not present

## 2021-05-03 DIAGNOSIS — N401 Enlarged prostate with lower urinary tract symptoms: Secondary | ICD-10-CM | POA: Diagnosis not present

## 2021-05-03 DIAGNOSIS — R351 Nocturia: Secondary | ICD-10-CM | POA: Diagnosis not present

## 2021-05-03 DIAGNOSIS — R3912 Poor urinary stream: Secondary | ICD-10-CM | POA: Diagnosis not present

## 2021-06-01 ENCOUNTER — Encounter: Payer: Self-pay | Admitting: Neurology

## 2021-06-03 ENCOUNTER — Encounter: Payer: Self-pay | Admitting: Gastroenterology

## 2021-06-09 DIAGNOSIS — R7401 Elevation of levels of liver transaminase levels: Secondary | ICD-10-CM | POA: Diagnosis not present

## 2021-06-16 DIAGNOSIS — Z125 Encounter for screening for malignant neoplasm of prostate: Secondary | ICD-10-CM | POA: Diagnosis not present

## 2021-06-16 DIAGNOSIS — N401 Enlarged prostate with lower urinary tract symptoms: Secondary | ICD-10-CM | POA: Diagnosis not present

## 2021-06-16 DIAGNOSIS — Z79899 Other long term (current) drug therapy: Secondary | ICD-10-CM | POA: Diagnosis not present

## 2021-06-16 DIAGNOSIS — E038 Other specified hypothyroidism: Secondary | ICD-10-CM | POA: Diagnosis not present

## 2021-06-16 DIAGNOSIS — G2581 Restless legs syndrome: Secondary | ICD-10-CM | POA: Diagnosis not present

## 2021-06-16 DIAGNOSIS — Z0000 Encounter for general adult medical examination without abnormal findings: Secondary | ICD-10-CM | POA: Diagnosis not present

## 2021-06-16 IMAGING — MR MR HEAD WO/W CM
19 of 21 series · 40 of 48 positions shown · IV contrast (10ml Multihance)
Comparison: 10/15/2018.

CLINICAL DATA: 78-year-old male with adrenal insufficiency. 17
millimeter pituitary mass discovered on [REDACTED] MRI.

Creatinine was obtained on site at [HOSPITAL] at [HOSPITAL].
Results: Creatinine 1.0 mg/dL.
EXAM:
MRI HEAD WITHOUT AND WITH CONTRAST
TECHNIQUE: Multiplanar, multiecho pulse sequences of the brain and surrounding
structures were obtained without and with intravenous contrast.
CONTRAST:  10mL MULTIHANCE GADOBENATE DIMEGLUMINE 529 MG/ML IV SOLN

[Series 2: T1 · sagittal · 5.0mm · 0.45mm/px · 1 of 23 slices shown]
[im 1/23]
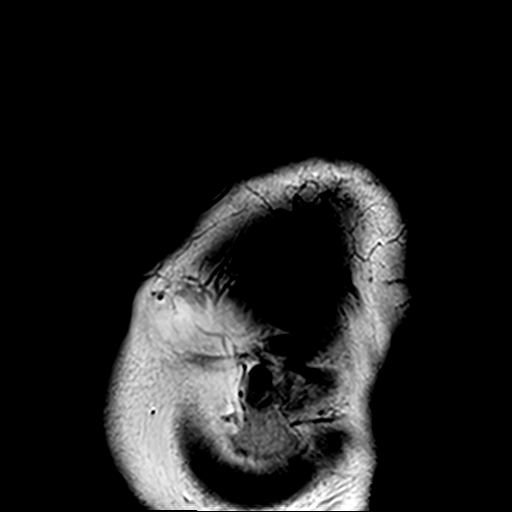

[Series 3: DWI · axial · 3.0mm · 1.80mm/px · z∈[-52,+100]mm · 7 of 104 slices shown (1 of 4)]
[im 1/104]
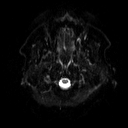
[im 18/104]
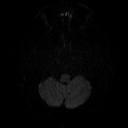
[im 35/104]
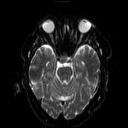
[im 52/104]
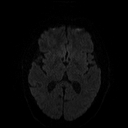
[im 69/104]
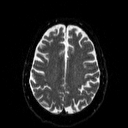
[im 86/104]
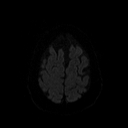
[im 104/104]
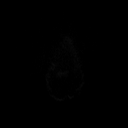

[Series 4: DWI · axial · 3.0mm · 1.80mm/px · z∈[-52,+100]mm · 4 of 51 slices shown (2 of 4)]
[im 1/51]
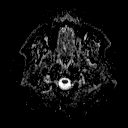
[im 17/51]
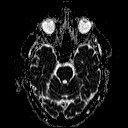
[im 34/51]
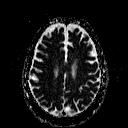
[im 51/51]
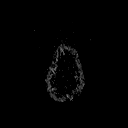

[Series 5: DWI · coronal · 5.0mm · 1.80mm/px · 6 of 76 slices shown (3 of 4)]
[im 1/76]
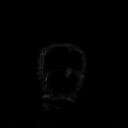
[im 16/76]
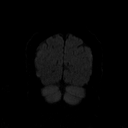
[im 31/76]
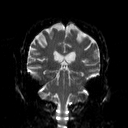
[im 46/76]
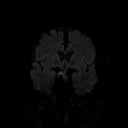
[im 61/76]
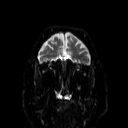
[im 76/76]
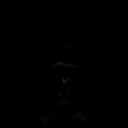

[Series 6: DWI · coronal · 5.0mm · 1.80mm/px · 3 of 38 slices shown (4 of 4)]
[im 1/38]
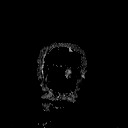
[im 19/38]
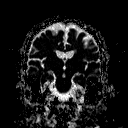
[im 38/38]
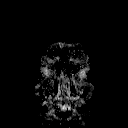

[Series 7: T2 · axial · 5.0mm · 0.51mm/px · z∈[-64,+90]mm · 2 of 24 slices shown]
[im 1/24]
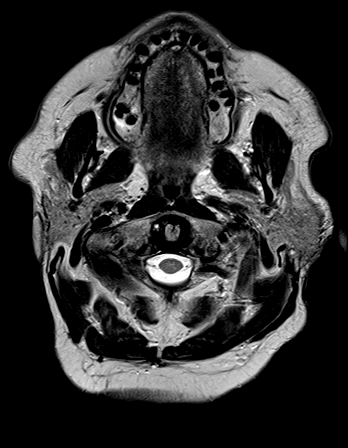
[im 24/24]
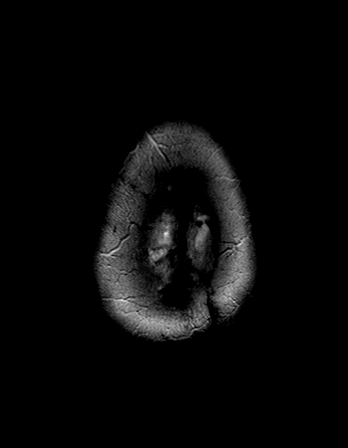

[Series 8: FLAIR · axial · 3.0mm · 0.45mm/px · z∈[-60,+97]mm · 3 of 35 slices shown]
[im 1/35]
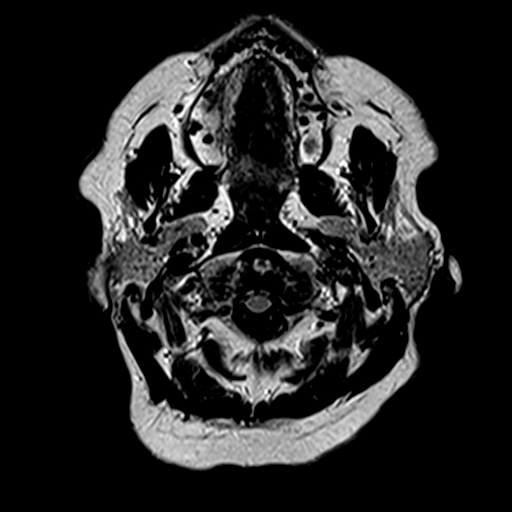
[im 18/35]
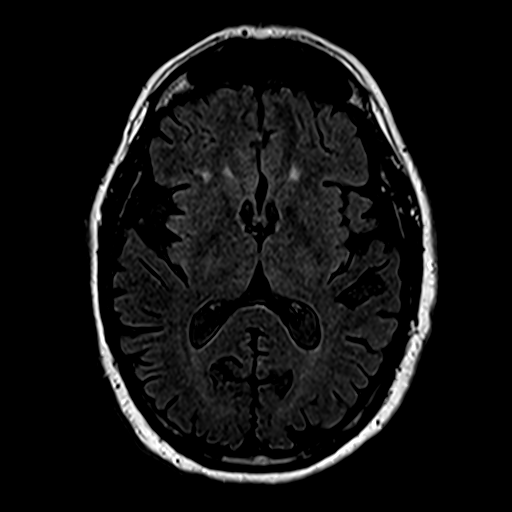
[im 35/35]
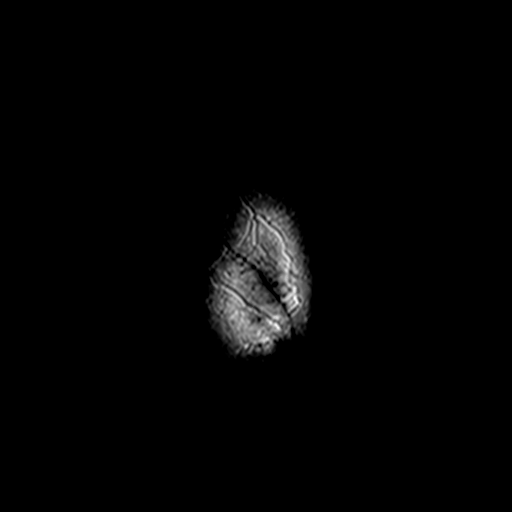

[Series 10: swi_images · axial · 4.0mm · 0.90mm/px · z∈[-59,+96]mm · 3 of 40 slices shown]
[im 1/40]
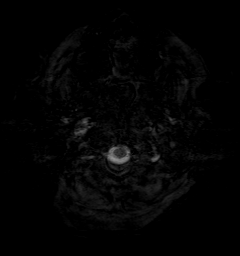
[im 20/40]
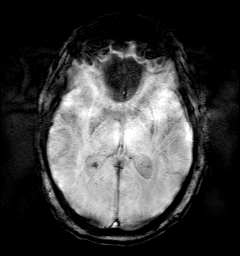
[im 40/40]
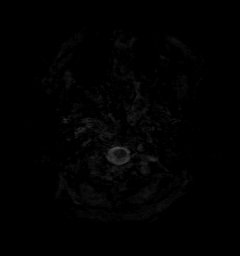

[Series 11: sag 3mm · sagittal · 3.0mm · 0.33mm/px · 1 of 11 slices shown]
[im 1/11]
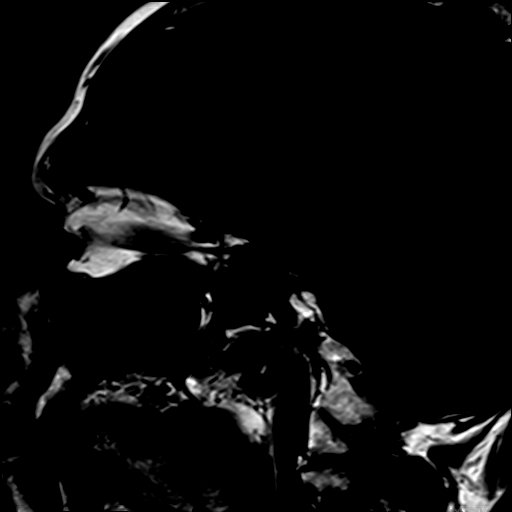

[Series 12: cor 3mm · coronal · 3.0mm · 0.33mm/px · 1 of 11 slices shown]
[im 1/11]
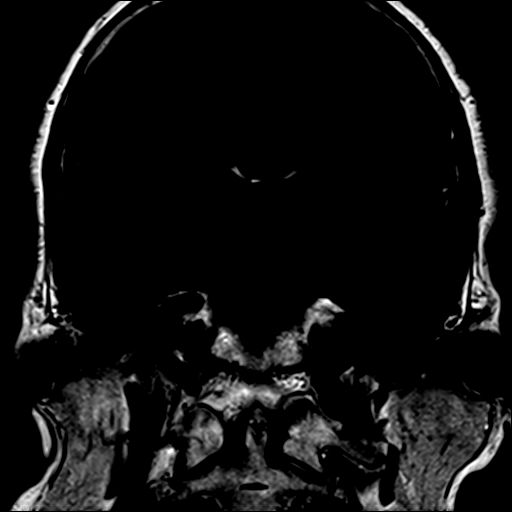

[Series 13: pre cor dynamic · coronal · non-contrast · 3.0mm · 0.35mm/px · 1 of 10 slices shown]
[im 1/10]
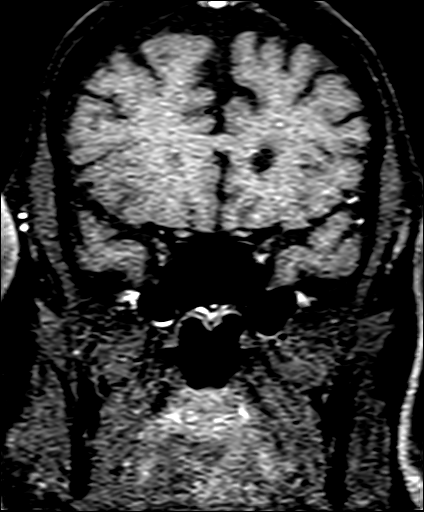

[Series 14: post fs cor · coronal · 3.0mm · 0.35mm/px · 1 of 10 slices shown (1 of 6)]
[im 1/10]
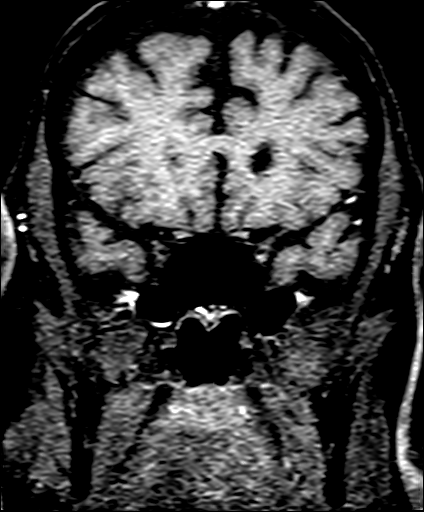

[Series 15: post fs cor · coronal · 3.0mm · 0.35mm/px · 1 of 10 slices shown (2 of 6)]
[im 1/10]
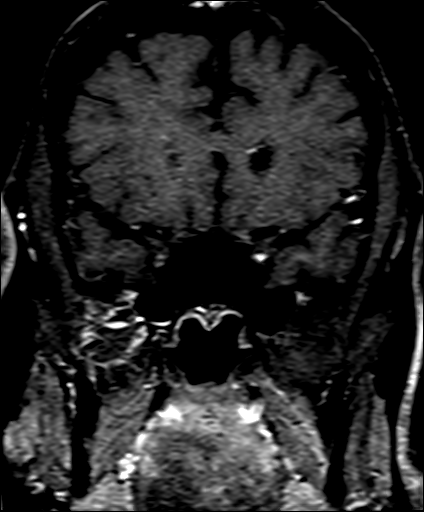

[Series 16: post fs cor · coronal · 3.0mm · 0.35mm/px · 1 of 10 slices shown (3 of 6)]
[im 1/10]
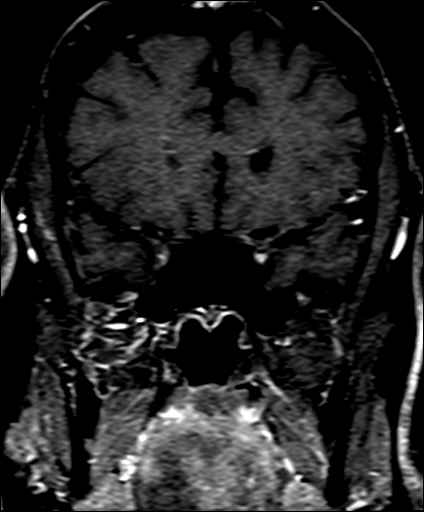

[Series 17: post fs cor · coronal · 3.0mm · 0.35mm/px · 1 of 10 slices shown (4 of 6)]
[im 1/10]
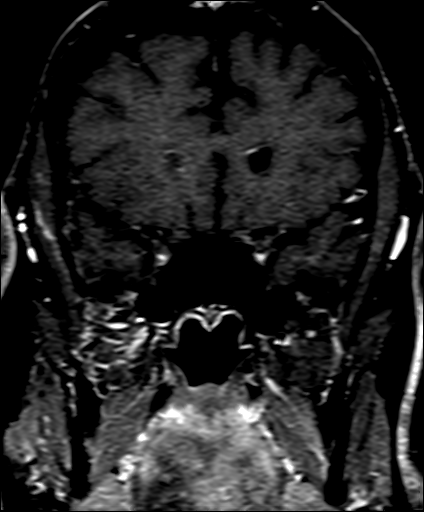

[Series 18: post fs cor · coronal · 3.0mm · 0.35mm/px · 1 of 10 slices shown (5 of 6)]
[im 1/10]
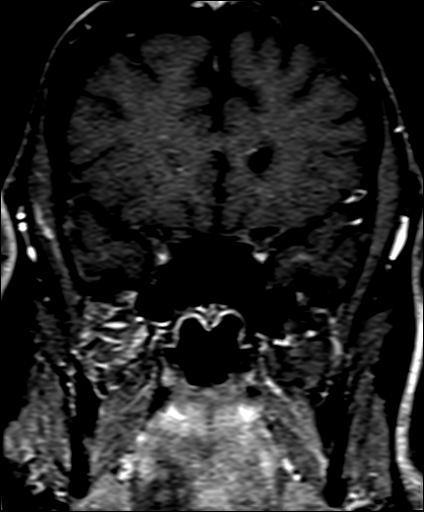

[Series 19: post fs cor · coronal · 3.0mm · 0.35mm/px · 1 of 10 slices shown (6 of 6)]
[im 1/10]
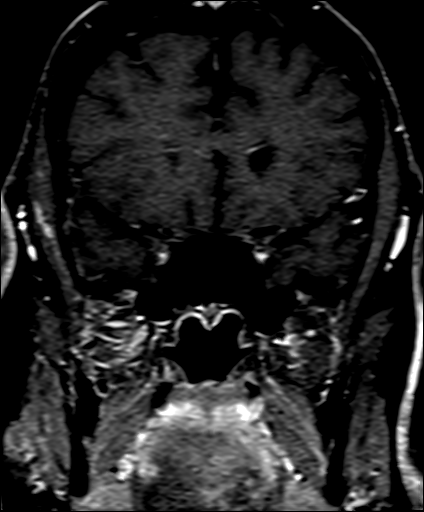

[Series 20: post sag 3mm · sagittal · 3.0mm · 0.33mm/px · 1 of 11 slices shown]
[im 1/11]
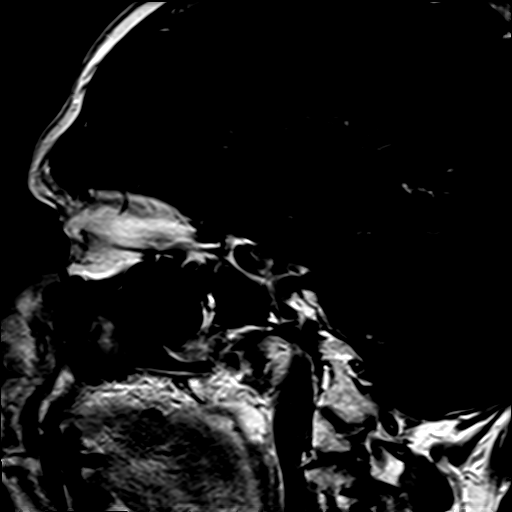

[Series 21: post cor 3mm · coronal · 3.0mm · 0.33mm/px · 1 of 11 slices shown]
[im 1/11]
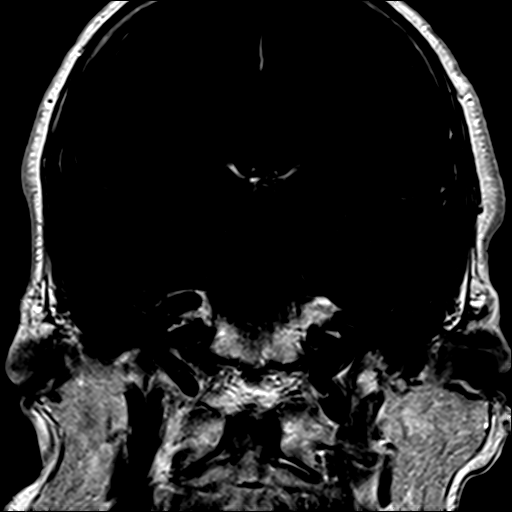

[40 of 48 positions shown; findings below may reference images not displayed]

FINDINGS: Brain: Pituitary is described below.

Cerebral volume remains normal for age. No restricted diffusion to
suggest acute infarction. No midline shift, ventriculomegaly,
extra-axial collection or acute intracranial hemorrhage.
Cervicomedullary junction within normal limits. Gray and white
matter signal is within normal limits for age throughout the brain.
No cortical encephalomalacia or chronic cerebral blood products
identified.

Excluding the pituitary region, no abnormal intracranial enhancement
(left middle frontal gyrus developmental venous anomaly, normal
variant, series 22, image 58). No dural thickening.

Vascular: Major intracranial vascular flow voids are stable.

Skull and upper cervical spine: Negative visible cervical spine.
Bone marrow signal, including at the clivus and skull base is
normal.

Sinuses/Orbits: Paranasal Visualized paranasal sinuses and mastoids
are stable and well pneumatized. Intraorbital soft tissues are
normal. Visible internal auditory structures appear normal.

Other: Dedicated pituitary imaging.

Heterogeneously hypoenhancing oval mass occupying and mildly
expanding the sella encompasses 15 x 17 x 15 millimeters (AP by
transverse by CC). This is eccentric to the right, with possible
early right cavernous sinus involvement (series 21, image 6). The
pituitary infundibulum is mildly deviated to the left and
foreshortened. There is bulging into the suprasellar cistern, and
effacement of the optic chiasm on series 21, image 6, but no other
suprasellar mass effect. The left cavernous sinus is normal. The
hypothalamus is normal. No other abnormal enhancement.
IMPRESSION: 1. Oval intra-sellar soft tissue mass most compatible with a
Pituitary Macroadenoma is stable since [DATE] mm.
Suspected early invasion of the right cavernous sinus, and mild
bulging into the suprasellar cistern which effaces the optic chiasm.
2. Otherwise normal for age MRI appearance of the brain.

## 2021-06-21 ENCOUNTER — Encounter: Payer: Self-pay | Admitting: Gastroenterology

## 2021-06-21 ENCOUNTER — Ambulatory Visit (INDEPENDENT_AMBULATORY_CARE_PROVIDER_SITE_OTHER): Payer: Medicare Other | Admitting: Gastroenterology

## 2021-06-21 VITALS — BP 150/90 | HR 88 | Ht 70.0 in | Wt 214.1 lb

## 2021-06-21 DIAGNOSIS — R140 Abdominal distension (gaseous): Secondary | ICD-10-CM

## 2021-06-21 DIAGNOSIS — R1084 Generalized abdominal pain: Secondary | ICD-10-CM | POA: Diagnosis not present

## 2021-06-21 NOTE — Progress Notes (Addendum)
06/21/2021 Julian Harris [id number] June 14, 1941   HISTORY OF PRESENT ILLNESS: This is an 80 year old male who is new to our office, referred here by Dr. Theda Sers for evaluation of loss of appetite and abdominal swelling.  He reports history of IBS.  He says that he has generalized/mid abdominal cramping sometimes when he eats, but after he has a bowel movement it resolves.  He complains of abdominal bloating and a lot of belching.  Denies any blood in his stools.  Really no diarrhea per se.  He tells me that his last colonoscopy was probably about 10 years ago with Dr. Benson Norway.  Thyroid studies from September were within normal limits/stable.   Past Medical History:  Diagnosis Date   Adrenal insufficiency ([place])    Hypertension    Hypothyroidism    Macroadenoma    Pituitary mass ([place])    Prediabetes    Past Surgical History:  Procedure Laterality Date   BILATERAL KNEE ARTHROSCOPY  2010   CYST REMOVAL TRUNK  2010    reports that he quit smoking about 47 years ago. His smoking use included cigarettes. He has never used smokeless tobacco. He reports current alcohol use. He reports that he does not use drugs. family history includes Heart disease in his father. Allergies  Allergen Reactions   Trazodone     Other reaction(s): too sedating      Outpatient Encounter Medications as of 06/21/2021  Medication Sig   doxycycline (VIBRAMYCIN) 100 MG capsule 1 capsule   levothyroxine (SYNTHROID) 75 MCG tablet Take 1 tablet by mouth daily.   predniSONE (DELTASONE) 5 MG tablet Take 5 mg by mouth 2 (two) times daily with a meal.   rOPINIRole (REQUIP) 1 MG tablet Take 1 mg by mouth at bedtime. Taking 1-3 hrs before bedtime.   tamsulosin (FLOMAX) 0.4 MG CAPS capsule Take 1 capsule by mouth every evening.   [DISCONTINUED] levothyroxine (SYNTHROID) 75 MCG tablet Take 75 mcg by mouth daily.   losartan (COZAAR) 25 MG tablet Take 1 tablet (25 mg total) by mouth daily.   No facility-administered  encounter medications on file as of 06/21/2021.     REVIEW OF SYSTEMS  : All other systems reviewed and negative except where noted in the History of Present Illness.   PHYSICAL EXAM: BP (!) 150/90   Pulse 88   Ht 5\' 10"  (1.778 m)   Wt 214 lb 2 oz (97.1 kg)   BMI 30.72 kg/m  General: Well developed white male in no acute distress Head: Normocephalic and atraumatic Eyes:  Sclerae anicteric, conjunctiva pink. Ears: Normal auditory acuity Lungs: Clear throughout to auscultation; no W/R/R. Heart: Regular rate and rhythm; no M/R/G. Abdomen: Soft, protuberant.  BS present.  Mild diffuse TTP. Musculoskeletal: Symmetrical with no gross deformities  Skin: No lesions on visible extremities Extremities: No edema  Neurological: Alert oriented x 4, grossly non-focal Psychological:  Alert and cooperative. Normal mood and affect  ASSESSMENT AND PLAN: *Generalized abdominal pain and bloating: Describes generalized abdominal cramping when he has to have a bowel movement, but is relieved by doing so.  Suspect a component of IBS.  We will plan for CT scan of the abdomen and pelvis with contrast.  We will have him sign for colonoscopy records from Dr. Benson Norway from about 10 years ago.  If CT scan is unremarkable question will be worth empirically treating him for SIBO/IBS-D with Xifaxan.  Would like to avoid colonoscopy unless absolutely necessary.  **Addendum:  Colonoscopy by Dr.  Hung on 06/02/2010 showed scattered diverticula and medium hemorrhoids.  CC:  Janie Morning, DO

## 2021-06-21 NOTE — Patient Instructions (Signed)
You have been scheduled for a CT scan of the abdomen and pelvis at [facility], 1st floor Radiology. You are scheduled on Monday 07/04/21  at 11:30 am. You should arrive 15 minutes prior to your appointment time for registration.  Please follow the written instructions below on the day of your exam:   1) Do not eat anything after 7:30 am (4 hours prior to your test)   2) Drink 1 bottle of contrast @ 9:30 am (2 hours prior to your exam)  Remember to shake well before drinking and do NOT pour over ice.     Drink 1 bottle of contrast @ 10:30 am (1 hour prior to your exam)   You may take any medications as prescribed with a small amount of water, if necessary. If you take any of the following medications: METFORMIN, GLUCOPHAGE, GLUCOVANCE, AVANDAMET, RIOMET, FORTAMET, [place] MET, JANUMET, GLUMETZA or METAGLIP, you MAY be asked to HOLD this medication 48 hours AFTER the exam.   The purpose of you drinking the oral contrast is to aid in the visualization of your intestinal tract. The contrast solution may cause some diarrhea. Depending on your individual set of symptoms, you may also receive an intravenous injection of x-ray contrast/dye. Plan on being at [facility] for 45 minutes or longer, depending on the type of exam you are having performed.   If you have any questions regarding your exam or if you need to reschedule, you may call [facility] at [phone] between the hours of 8:00 am and 5:00 pm, Monday-Friday.

## 2021-06-30 ENCOUNTER — Encounter: Payer: Self-pay | Admitting: *Deleted

## 2021-07-04 ENCOUNTER — Ambulatory Visit (HOSPITAL_COMMUNITY)
Admission: RE | Admit: 2021-07-04 | Discharge: 2021-07-04 | Disposition: A | Discharge status: Home / Self Care | Payer: Medicare Other | Source: Ambulatory Visit | Attending: Gastroenterology | Admitting: Gastroenterology

## 2021-07-04 ENCOUNTER — Other Ambulatory Visit: Payer: Self-pay

## 2021-07-04 DIAGNOSIS — R1084 Generalized abdominal pain: Secondary | ICD-10-CM | POA: Diagnosis not present

## 2021-07-04 DIAGNOSIS — K760 Fatty (change of) liver, not elsewhere classified: Secondary | ICD-10-CM | POA: Diagnosis not present

## 2021-07-04 DIAGNOSIS — R140 Abdominal distension (gaseous): Secondary | ICD-10-CM | POA: Diagnosis not present

## 2021-07-04 DIAGNOSIS — R109 Unspecified abdominal pain: Secondary | ICD-10-CM | POA: Diagnosis not present

## 2021-07-04 LAB — POCT I-STAT CREATININE: Creatinine, Ser: 1.2 mg/dL (ref 0.61–1.24)

## 2021-07-04 MED ORDER — IOHEXOL 350 MG/ML SOLN
100.0000 mL | Freq: Once | INTRAVENOUS | Status: AC | PRN
  Administered 2021-07-04: 80 mL via INTRAVENOUS

## 2021-07-05 ENCOUNTER — Ambulatory Visit (INDEPENDENT_AMBULATORY_CARE_PROVIDER_SITE_OTHER): Payer: Medicare Other | Admitting: Psychiatry

## 2021-07-05 ENCOUNTER — Encounter: Payer: Self-pay | Admitting: Psychiatry

## 2021-07-05 ENCOUNTER — Encounter: Payer: Self-pay | Admitting: Gastroenterology

## 2021-07-05 VITALS — BP 126/84 | HR 100 | Ht 70.0 in | Wt 217.3 lb

## 2021-07-05 DIAGNOSIS — R1084 Generalized abdominal pain: Secondary | ICD-10-CM | POA: Insufficient documentation

## 2021-07-05 DIAGNOSIS — R140 Abdominal distension (gaseous): Secondary | ICD-10-CM | POA: Insufficient documentation

## 2021-07-05 DIAGNOSIS — G44309 Post-traumatic headache, unspecified, not intractable: Secondary | ICD-10-CM

## 2021-07-05 DIAGNOSIS — D352 Benign neoplasm of pituitary gland: Secondary | ICD-10-CM

## 2021-07-05 NOTE — Patient Instructions (Addendum)
CT Scan Follow up after testing Guidelines for the Management of lightheadedness  1. Make all postural changes from lying to sitting or sitting to standing, slowly.  2. Drink to 2.0 -2.5 L of fluids per day.  3. Avoid large meals which can cause low blood pressure during digestion. It is better to eat smaller meals more often than three large meals.  4. Avoid alcohol. Alcohol and cause blood to pool in the legs which may worsen low blood pressure reactions when standing.  5. Perform lower extremity exercises to improve strength of the leg muscles. This will help prevent blood from a pooling in the legs when standing and walking.  6. Raise the head of the bed by 6 to 10 inches. The entire bed must be at an angle. Raising only the head portion of the bed at waist level or using pillows will not be effective. Raising the head of the bed will reduce urine formation overnight and there will be more volume in the circulation in the morning.  7. During bad days, drink 500 ml of water quickly. This will result in an increased blood pressure within 5 minutes of drinking the water. The effect will last up to one hour and may improve orthostatic intolerance.  8. Use custom fitted elastic support stockings. These will reduce a tendency for blood to pool in the legs when standing and may improve orthostatic intolerance.  9. Use physical counter maneuvers such as leg crossing, squatting, or raising and resting the leg on a chair. These maneuvers increase blood pressure and can improve orthostatic intolerance.

## 2021-07-05 NOTE — Progress Notes (Signed)
Referring:  Janie Morning, DO [address] [place],  [place] [address]  PCP: Janie Morning, DO  Neurology was asked to evaluate Julian Harris, an 80 year old male for a chief complaint of headaches.  Our recommendations of care will be communicated by shared medical record.    CC:  headaches  HPI:  Medical co-morbidities: pituitary macroadenoma, central hypothyroidism  The patient presents for evaluation of headaches which began around May 2022. At that time he fell and hit his head on the cement. Had headaches for a while which resolved on their own.  Had a second head trauma a couple of months later which also caused headaches. These have since resolved. He did not seek medical attention after either head trauma.  Headaches have improved, but he has a persistent feeling that  when he turns his head too fast his "brain is shaking around". If he bends over or exerts himself he will get light-headed. Denies vertigo. Sometimes will check his BP when he is lightheaded and it will be low. Does have some residual neck pain on the left. Sometimes gets a brief sharp pain in his fingertip or in his toe. Denies shooting pains down his arm or neck.  Also notes a postural tremor when holding a glass in left hand > right. This is not bothersome to him.  He has a history of pituitary macroadenoma. Was previously evaluated by NSGY who did not want to operate due to age and lack of symptoms. Last MRI was in 2020. He attempted to get another MRI last year but was unable to tolerate it due to claustrophobia.  Headache Risk Factors: Headache risk factors and/or co-morbidities (+) Neck Pain (+) Sleep Disorder - insomnia (+) Obesity  Body mass index is 31.18 kg/m. (+) History of Traumatic Brain Injury and/or Concussion  LABS: BMP Latest Ref Rng & Units 07/04/2021 10/16/2018 10/15/2018  Glucose 70 - 99 mg/dL - 97 100(H)  BUN 8 - 23 mg/dL - 5(L) 6(L)  Creatinine 0.61 - 1.24 mg/dL 1.20 1.05  1.06  Sodium 135 - 145 mmol/L - 136 134(L)  Potassium 3.5 - 5.1 mmol/L - 3.5 3.4(L)  Chloride 98 - 111 mmol/L - 101 99  CO2 22 - 32 mmol/L - 26 25  Calcium 8.9 - 10.3 mg/dL - 9.2 9.5   CBC    Component Value Date/Time   WBC 5.7 10/15/2018 1213   RBC 4.40 10/15/2018 1213   HGB 13.9 10/15/2018 1213   HCT 41.4 10/15/2018 1213   PLT 231 10/15/2018 1213   MCV 94.1 10/15/2018 1213   MCH 31.6 10/15/2018 1213   MCHC 33.6 10/15/2018 1213   RDW 12.8 10/15/2018 1213   LYMPHSABS 1.7 12/31/2008 1322   MONOABS 0.6 12/31/2008 1322   EOSABS 0.2 12/31/2008 1322   BASOSABS 0.1 12/31/2008 1322     IMAGING:  MRI brain with contrast 06/2019: 17 mm pituitary macroadenoma  Imaging independently reviewed on July 05, 2021   Current Outpatient Medications on File Prior to Visit  Medication Sig Dispense Refill   levothyroxine (SYNTHROID) 75 MCG tablet Take 1 tablet by mouth daily.     predniSONE (DELTASONE) 5 MG tablet Take 5 mg by mouth 2 (two) times daily with a meal.     rOPINIRole (REQUIP) 1 MG tablet Take 1 mg by mouth at bedtime. Taking 1-3 hrs before bedtime.     tamsulosin (FLOMAX) 0.4 MG CAPS capsule Take 1 capsule by mouth every evening.     No  current facility-administered medications on file prior to visit.     Allergies: Allergies  Allergen Reactions   Trazodone     Other reaction(s): too sedating    Family History: Family History  Problem Relation Age of Onset   Heart disease Father        coronary stents, valve replacement     Past Medical History: Past Medical History:  Diagnosis Date   Adrenal insufficiency ([place])    Hypertension    Hypothyroidism    Macroadenoma    Pituitary mass ([place]) 2020   Prediabetes     Past Surgical History Past Surgical History:  Procedure Laterality Date   BILATERAL KNEE ARTHROSCOPY  2010   CYST REMOVAL TRUNK  2010    Social History: Social History   Tobacco Use   Smoking status: Former    Types: Cigarettes    Quit  date: 11/25/1973    Years since quitting: 47.6   Smokeless tobacco: Never  Vaping Use   Vaping Use: Never used  Substance Use Topics   Alcohol use: Yes    Comment: occasional   Drug use: No    ROS: Negative for fevers, chills. Positive for light-headedness. All other systems reviewed and negative unless stated otherwise in HPI.   Physical Exam:   Vital Signs: BP 126/84   Pulse 100   Ht 5\' 10"  (1.778 m)   Wt 217 lb 5 oz (98.6 kg)   SpO2 95%   BMI 31.18 kg/m  GENERAL: well appearing,in no acute distress,alert SKIN:  Color, texture, turgor normal. No rashes or lesions HEAD:  Normocephalic/atraumatic. CV:  RRR RESP: Normal respiratory effort MSK: +tenderness to palpation over right neck  NEUROLOGICAL: Mental Status: Alert, oriented to person, place and time,Follows commands Cranial Nerves: PERRL,visual fields intact to confrontation,extraocular movements intact,facial sensation intact,no facial droop or ptosis,hearing intact to finger rub bilaterally,no dysarthria,palate elevate symmetrically,tongue protrudes midline,shoulder shrug intact and symmetric Motor: muscle strength 5/5 both upper and lower extremities,no drift, normal tone Reflexes: 2+ throughout Sensation: intact to light touch all 4 extremities Coordination: Finger-to- nose-finger intact bilaterally, postural tremor present R>L upper extremity Gait: normal-based   IMPRESSION: 80 year old male with a history of pituitary macroadenoma, central hypothyroidism who presents for evaluation of lightheadedness and headaches following a head trauma. Discussed obtaining imaging due to persistent symptoms following head injury and history of pituitary adenoma. He does not want to undergo MRI at this time due to claustrophobia. Will order a CT scan to assess for post-traumatic changes and for stability of his tumor. Measures discussed to help reduce orthostatic intolerance including maintaining good hydration and performing  lower extremity exercises. He is not interested in starting medications at this time as symptoms are not particularly bothersome to him.  PLAN: -CT brain -Information on measures to reduce orthostatic intolerance provided  I spent a total of 47 minutes chart reviewing and counseling the patient. Written educational materials and patient instructions outlining all of the above were given.  Follow-up: after testing   Genia Harold, MD 07/05/2021   4:25 PM

## 2021-07-06 NOTE — Progress Notes (Signed)
I agree with the assessment and plan as outlined by Ms. Myrtice Lauth. I reviewed the results of his CT A/P. He has a reasonable amount of stool burden on his CT. Recommend treatment for constipation by encouraging hydration with 8 cups of water per day, increased physical activity, increased fiber intake or daily fiber supplement, and daily Miralax. Patient also has history of hypothyroidism and has not had his TSH checked recently per my review of the EMR. Patient may benefit from having this checked to see if he needs to have his levothyroxine dose adjusted.

## 2021-07-26 ENCOUNTER — Other Ambulatory Visit: Payer: Self-pay

## 2021-07-26 ENCOUNTER — Ambulatory Visit
Admission: RE | Admit: 2021-07-26 | Discharge: 2021-07-26 | Disposition: A | Discharge status: Home / Self Care | Payer: Medicare Other | Source: Ambulatory Visit | Attending: Psychiatry | Admitting: Psychiatry

## 2021-07-26 DIAGNOSIS — R519 Headache, unspecified: Secondary | ICD-10-CM | POA: Diagnosis not present

## 2021-07-26 DIAGNOSIS — G44309 Post-traumatic headache, unspecified, not intractable: Secondary | ICD-10-CM | POA: Diagnosis not present

## 2021-08-02 ENCOUNTER — Telehealth: Payer: Self-pay

## 2021-08-02 NOTE — Telephone Encounter (Signed)
Contacted pt back, informed him of Dr Georgina Peer recommendations. He  was appreciative

## 2021-08-02 NOTE — Telephone Encounter (Signed)
At our last visit I provided him with a list of lifestyle methods to try to help reduce his lightheadedness including staying hydrated, using compression stockings, and crossing or raising his legs while standing. He can also try eating more salty food in his diet.

## 2021-08-02 NOTE — Telephone Encounter (Signed)
-----   Message from Genia Harold, MD sent at 08/02/2021  9:27 AM EST ----- CT shows his pituitary adenoma is stable, unchanged from his 2020 MRI. Otherwise his CT scan is normal

## 2021-08-02 NOTE — Telephone Encounter (Signed)
Contacted pt, informed him CT shows his pituitary adenoma is stable, unchanged from his 2020 MRI. Otherwise his CT scan is normal.   Pt asked why and if he is going to have to just live with him becoming light headed when he stands. Any suggestions ? Please advise

## 2021-08-15 DIAGNOSIS — E237 Disorder of pituitary gland, unspecified: Secondary | ICD-10-CM | POA: Diagnosis not present

## 2021-08-15 DIAGNOSIS — E2740 Unspecified adrenocortical insufficiency: Secondary | ICD-10-CM | POA: Diagnosis not present

## 2021-08-15 DIAGNOSIS — E038 Other specified hypothyroidism: Secondary | ICD-10-CM | POA: Diagnosis not present

## 2021-08-15 DIAGNOSIS — R7303 Prediabetes: Secondary | ICD-10-CM | POA: Diagnosis not present

## 2021-08-22 DIAGNOSIS — R7303 Prediabetes: Secondary | ICD-10-CM | POA: Diagnosis not present

## 2021-08-22 DIAGNOSIS — Z6834 Body mass index (BMI) 34.0-34.9, adult: Secondary | ICD-10-CM | POA: Diagnosis not present

## 2021-08-22 DIAGNOSIS — D352 Benign neoplasm of pituitary gland: Secondary | ICD-10-CM | POA: Diagnosis not present

## 2021-08-22 DIAGNOSIS — E230 Hypopituitarism: Secondary | ICD-10-CM | POA: Diagnosis not present

## 2021-08-22 DIAGNOSIS — E237 Disorder of pituitary gland, unspecified: Secondary | ICD-10-CM | POA: Diagnosis not present

## 2021-08-22 DIAGNOSIS — E038 Other specified hypothyroidism: Secondary | ICD-10-CM | POA: Diagnosis not present

## 2021-08-22 DIAGNOSIS — K7581 Nonalcoholic steatohepatitis (NASH): Secondary | ICD-10-CM | POA: Diagnosis not present

## 2021-08-22 DIAGNOSIS — E2740 Unspecified adrenocortical insufficiency: Secondary | ICD-10-CM | POA: Diagnosis not present

## 2021-08-29 ENCOUNTER — Ambulatory Visit: Payer: PRIVATE HEALTH INSURANCE | Admitting: Neurology

## 2021-11-14 DIAGNOSIS — D692 Other nonthrombocytopenic purpura: Secondary | ICD-10-CM | POA: Diagnosis not present

## 2021-11-14 DIAGNOSIS — L821 Other seborrheic keratosis: Secondary | ICD-10-CM | POA: Diagnosis not present

## 2021-11-14 DIAGNOSIS — L570 Actinic keratosis: Secondary | ICD-10-CM | POA: Diagnosis not present

## 2022-02-20 DIAGNOSIS — K7581 Nonalcoholic steatohepatitis (NASH): Secondary | ICD-10-CM | POA: Diagnosis not present

## 2022-02-20 DIAGNOSIS — E038 Other specified hypothyroidism: Secondary | ICD-10-CM | POA: Diagnosis not present

## 2022-02-27 DIAGNOSIS — E237 Disorder of pituitary gland, unspecified: Secondary | ICD-10-CM | POA: Diagnosis not present

## 2022-02-27 DIAGNOSIS — E230 Hypopituitarism: Secondary | ICD-10-CM | POA: Diagnosis not present

## 2022-02-27 DIAGNOSIS — E038 Other specified hypothyroidism: Secondary | ICD-10-CM | POA: Diagnosis not present

## 2022-02-27 DIAGNOSIS — D352 Benign neoplasm of pituitary gland: Secondary | ICD-10-CM | POA: Diagnosis not present

## 2022-02-27 DIAGNOSIS — E2740 Unspecified adrenocortical insufficiency: Secondary | ICD-10-CM | POA: Diagnosis not present

## 2022-02-27 DIAGNOSIS — K7581 Nonalcoholic steatohepatitis (NASH): Secondary | ICD-10-CM | POA: Diagnosis not present

## 2022-02-27 DIAGNOSIS — R7303 Prediabetes: Secondary | ICD-10-CM | POA: Diagnosis not present

## 2022-02-27 DIAGNOSIS — Z6834 Body mass index (BMI) 34.0-34.9, adult: Secondary | ICD-10-CM | POA: Diagnosis not present

## 2022-06-01 DIAGNOSIS — N5201 Erectile dysfunction due to arterial insufficiency: Secondary | ICD-10-CM | POA: Diagnosis not present

## 2022-06-01 DIAGNOSIS — R351 Nocturia: Secondary | ICD-10-CM | POA: Diagnosis not present

## 2022-06-01 DIAGNOSIS — R3912 Poor urinary stream: Secondary | ICD-10-CM | POA: Diagnosis not present

## 2022-06-01 DIAGNOSIS — N401 Enlarged prostate with lower urinary tract symptoms: Secondary | ICD-10-CM | POA: Diagnosis not present

## 2022-06-05 DIAGNOSIS — M545 Low back pain, unspecified: Secondary | ICD-10-CM | POA: Diagnosis not present

## 2022-06-05 DIAGNOSIS — M25562 Pain in left knee: Secondary | ICD-10-CM | POA: Diagnosis not present

## 2022-06-08 DIAGNOSIS — M545 Low back pain, unspecified: Secondary | ICD-10-CM | POA: Diagnosis not present

## 2022-06-13 DIAGNOSIS — Z125 Encounter for screening for malignant neoplasm of prostate: Secondary | ICD-10-CM | POA: Diagnosis not present

## 2022-06-13 DIAGNOSIS — Z13220 Encounter for screening for lipoid disorders: Secondary | ICD-10-CM | POA: Diagnosis not present

## 2022-06-13 DIAGNOSIS — Z79899 Other long term (current) drug therapy: Secondary | ICD-10-CM | POA: Diagnosis not present

## 2022-06-20 DIAGNOSIS — Z0000 Encounter for general adult medical examination without abnormal findings: Secondary | ICD-10-CM | POA: Diagnosis not present

## 2022-06-20 DIAGNOSIS — Z862 Personal history of diseases of the blood and blood-forming organs and certain disorders involving the immune mechanism: Secondary | ICD-10-CM | POA: Diagnosis not present

## 2022-06-20 DIAGNOSIS — K7581 Nonalcoholic steatohepatitis (NASH): Secondary | ICD-10-CM | POA: Diagnosis not present

## 2022-06-20 DIAGNOSIS — G2581 Restless legs syndrome: Secondary | ICD-10-CM | POA: Diagnosis not present

## 2022-06-20 DIAGNOSIS — E038 Other specified hypothyroidism: Secondary | ICD-10-CM | POA: Diagnosis not present

## 2022-06-20 DIAGNOSIS — N401 Enlarged prostate with lower urinary tract symptoms: Secondary | ICD-10-CM | POA: Diagnosis not present

## 2022-06-20 DIAGNOSIS — D352 Benign neoplasm of pituitary gland: Secondary | ICD-10-CM | POA: Diagnosis not present

## 2022-07-21 ENCOUNTER — Ambulatory Visit: Payer: Medicare Other | Admitting: Cardiology

## 2022-07-21 ENCOUNTER — Encounter: Payer: Self-pay | Admitting: Cardiology

## 2022-07-21 VITALS — BP 167/99 | HR 80 | Ht 70.0 in | Wt 224.0 lb

## 2022-07-21 DIAGNOSIS — R0609 Other forms of dyspnea: Secondary | ICD-10-CM | POA: Diagnosis not present

## 2022-07-21 DIAGNOSIS — I951 Orthostatic hypotension: Secondary | ICD-10-CM | POA: Diagnosis not present

## 2022-07-21 NOTE — Progress Notes (Unsigned)
Follow up visit  Subjective:   Julian Harris, male    DOB: 20-Sep-1940, 81 y.o.   MRN: [id number]     HPI  No chief complaint on file.   81 year-old Caucasian male with adrenal insufficiency, pituitary microadenoma, now with exertional dyspnea and lightheadedness  Patient continues to have lightheadedness. Blood pressure remains elevated.   OV 03/2020: Patient was diagnosed with adrenal insufficiency in March 2020.  He is seeing endocrinologist Dr. Garnet Koyanagi for management of the same.  He is currently on prednisone 5 mg daily, and levothyroxine 25 mcg daily.  He has had serial MRIs, with no increase in the size of pituitary macroadenoma.  His orthostatic hypotension has significantly improved.  He was unable to even stand back in March 2020, this is since resolved.  However, he continues to have episodes of lightheadedness, particularly during the early part of the day.  He also notices exertional dyspnea symptoms.  He tells me that he has gained several pounds in the last year or so, while on prednisone.  He denies any chest pain, presyncope or syncope.  Also denies any significant orthopnea, PND.   Current Outpatient Medications:    levothyroxine (SYNTHROID) 75 MCG tablet, Take 1 tablet by mouth daily., Disp: , Rfl:    predniSONE (DELTASONE) 5 MG tablet, Take 5 mg by mouth 2 (two) times daily with a meal., Disp: , Rfl:    rOPINIRole (REQUIP) 1 MG tablet, Take 1 mg by mouth at bedtime. Taking 1-3 hrs before bedtime., Disp: , Rfl:    tamsulosin (FLOMAX) 0.4 MG CAPS capsule, Take 1 capsule by mouth every evening., Disp: , Rfl:     Cardiovascular & other pertient studies:  EKG 07/21/2022: Sinus rhythm 76 bpm  Low voltage in precordial leads Left atrial enlargement  Echocardiogram 04/30/2020:  Study Quality: Technically difficult study.  Normal LV systolic function with visual EF 60-65%. Left ventricle cavity  is normal in size. Normal global wall motion. Normal diastolic filling   pattern, normal LAP. Calculated EF 65%.  Mild (Grade I) aortic regurgitation.  Native mitral valve with trace regurgitation. Mild calcification of the  mitral valve annulus. Mild mitral valve stenosis.  Compared to prior study dated 10/15/2018 AR and MS are new.   EKG 04/23/2020: Sinus rhythm 68 bpm Low voltage, otherwise normal EKG  MRI brain 06/2019: 1. 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 Suspected early invasion of the right cavernous sinus, and mild bulging into the suprasellar cistern which effaces the optic chiasm. 2. Otherwise normal for age MRI appearance of the brain.   Recent labs: 06/13/2022: Chol 175, TG 188, HDL 69,   06/2021: Cr 1.2  6/202: TSH 0.01  09/2020: BUN 17  2021: LDL 125  Glucose NA, BUN/Cr 17/***. EGFR ***. Na/K ***/***. ***Rest of the CMP normal H/H ***/***. MCV ***. Platelets *** ***HbA1C ***% Chol ***, TG ***, HDL ***, LDL *** ***TSH ***normal  04/21/2020: BUN 17  05/2019: Chol 195, TG 109, HDL 59, LDL 117 TSH 0.3  3//2020: Glucose 87, BUN/Cr 5/1.05. EGFR >60. Na/K 136/3.5.  H/H 13.9/40.4. MCV 94. Platelets 231   Review of Systems  Cardiovascular:  Positive for dyspnea on exertion. Negative for chest pain, leg swelling, palpitations and syncope.  Neurological:  Positive for dizziness and light-headedness.         Vitals:   07/21/22 1520 07/21/22 1521  BP:    Pulse:    SpO2:  97% 92%   Orthostatic VS for the past 72 hrs (Last 3 readings):  Orthostatic BP Patient Position BP Location Cuff Size Orthostatic Pulse  07/21/22 1521 133/79 Standing Left Arm Normal 90  07/21/22 1520 (!) 151/106 Sitting Left Arm Normal 81  07/21/22 1519 (!) 155/108 Supine Left Arm Normal 76     Body mass index is 32.14 kg/m. Filed Weights   07/21/22 1511  Weight: 224 lb (101.6 kg)     Objective:   Physical Exam Vitals and nursing note reviewed.   Constitutional:      General: He is not in acute distress.    Appearance: He is obese.  Neck:     Vascular: No JVD.  Cardiovascular:     Rate and Rhythm: Normal rate and regular rhythm.     Heart sounds: Normal heart sounds. No murmur heard. Pulmonary:     Effort: Pulmonary effort is normal.     Breath sounds: Normal breath sounds. No wheezing or rales.           Assessment & Recommendations:   81 year-old Caucasian male with adrenal insufficiency, pituitary microadenoma, now with exertional dyspnea and lightheadedness  *** Exertional dyspnea: Likely multifactorial, including obesity, deconditioning.  Low suspicion for angina equivalent at this time.  No severe abnormalities on echocardiogram (12/2019). If his symptoms persist in spite of losing weight, will consider exercise nuclear stress test down the road.  Lightheadedness: Suspect this may be related to hypertension. Given his h/o orthostatic hypotension, I will try low dose losartan 25 mg daily.  Pituitary macroadenoma: Continue management as per Dr. Festus Holts recommendations.  F/u in ***    [name], MD Pager: [phone] Office: [phone]

## 2022-07-22 ENCOUNTER — Encounter: Payer: Self-pay | Admitting: Cardiology

## 2022-08-26 ENCOUNTER — Encounter (HOSPITAL_COMMUNITY): Payer: Self-pay

## 2022-08-26 ENCOUNTER — Emergency Department (HOSPITAL_COMMUNITY): Payer: Medicare Other

## 2022-08-26 ENCOUNTER — Other Ambulatory Visit: Payer: Self-pay

## 2022-08-26 ENCOUNTER — Inpatient Hospital Stay (HOSPITAL_COMMUNITY)
Admission: EM | Admit: 2022-08-26 | Discharge: 2022-08-28 | DRG: 643 | Disposition: A | Discharge status: Home / Self Care | Payer: Medicare Other | Attending: Internal Medicine | Admitting: Internal Medicine

## 2022-08-26 DIAGNOSIS — Z79890 Hormone replacement therapy (postmenopausal): Secondary | ICD-10-CM | POA: Diagnosis not present

## 2022-08-26 DIAGNOSIS — Z79899 Other long term (current) drug therapy: Secondary | ICD-10-CM | POA: Diagnosis not present

## 2022-08-26 DIAGNOSIS — D352 Benign neoplasm of pituitary gland: Secondary | ICD-10-CM | POA: Diagnosis not present

## 2022-08-26 DIAGNOSIS — R531 Weakness: Secondary | ICD-10-CM | POA: Diagnosis not present

## 2022-08-26 DIAGNOSIS — Z7952 Long term (current) use of systemic steroids: Secondary | ICD-10-CM

## 2022-08-26 DIAGNOSIS — Z87891 Personal history of nicotine dependence: Secondary | ICD-10-CM | POA: Diagnosis not present

## 2022-08-26 DIAGNOSIS — R220 Localized swelling, mass and lump, head: Secondary | ICD-10-CM | POA: Diagnosis not present

## 2022-08-26 DIAGNOSIS — I951 Orthostatic hypotension: Secondary | ICD-10-CM | POA: Diagnosis not present

## 2022-08-26 DIAGNOSIS — G9341 Metabolic encephalopathy: Secondary | ICD-10-CM | POA: Diagnosis present

## 2022-08-26 DIAGNOSIS — N390 Urinary tract infection, site not specified: Secondary | ICD-10-CM | POA: Diagnosis present

## 2022-08-26 DIAGNOSIS — U071 COVID-19: Secondary | ICD-10-CM | POA: Diagnosis present

## 2022-08-26 DIAGNOSIS — E039 Hypothyroidism, unspecified: Secondary | ICD-10-CM | POA: Diagnosis present

## 2022-08-26 DIAGNOSIS — Z888 Allergy status to other drugs, medicaments and biological substances status: Secondary | ICD-10-CM | POA: Diagnosis not present

## 2022-08-26 DIAGNOSIS — Z8249 Family history of ischemic heart disease and other diseases of the circulatory system: Secondary | ICD-10-CM

## 2022-08-26 DIAGNOSIS — W19XXXA Unspecified fall, initial encounter: Secondary | ICD-10-CM | POA: Diagnosis not present

## 2022-08-26 DIAGNOSIS — I10 Essential (primary) hypertension: Secondary | ICD-10-CM | POA: Diagnosis present

## 2022-08-26 DIAGNOSIS — R296 Repeated falls: Secondary | ICD-10-CM | POA: Diagnosis present

## 2022-08-26 DIAGNOSIS — Z8639 Personal history of other endocrine, nutritional and metabolic disease: Secondary | ICD-10-CM

## 2022-08-26 DIAGNOSIS — R079 Chest pain, unspecified: Secondary | ICD-10-CM | POA: Diagnosis not present

## 2022-08-26 DIAGNOSIS — E860 Dehydration: Secondary | ICD-10-CM | POA: Diagnosis present

## 2022-08-26 DIAGNOSIS — J329 Chronic sinusitis, unspecified: Secondary | ICD-10-CM | POA: Diagnosis not present

## 2022-08-26 DIAGNOSIS — E272 Addisonian crisis: Principal | ICD-10-CM | POA: Diagnosis present

## 2022-08-26 DIAGNOSIS — E2740 Unspecified adrenocortical insufficiency: Secondary | ICD-10-CM | POA: Diagnosis present

## 2022-08-26 LAB — URINALYSIS, ROUTINE W REFLEX MICROSCOPIC
Bacteria, UA: NONE SEEN
Bilirubin Urine: NEGATIVE
Glucose, UA: NEGATIVE mg/dL
Hgb urine dipstick: NEGATIVE
Ketones, ur: NEGATIVE mg/dL
Leukocytes,Ua: NEGATIVE
Nitrite: NEGATIVE
Protein, ur: 30 mg/dL — AB
Specific Gravity, Urine: 1.012 (ref 1.005–1.030)
pH: 6 (ref 5.0–8.0)

## 2022-08-26 LAB — CBC WITH DIFFERENTIAL/PLATELET
Abs Immature Granulocytes: 0.07 10*3/uL (ref 0.00–0.07)
Basophils Absolute: 0.1 10*3/uL (ref 0.0–0.1)
Basophils Relative: 1 %
Eosinophils Absolute: 0.1 10*3/uL (ref 0.0–0.5)
Eosinophils Relative: 1 %
HCT: 41.9 % (ref 39.0–52.0)
Hemoglobin: 13.5 g/dL (ref 13.0–17.0)
Immature Granulocytes: 1 %
Lymphocytes Relative: 16 %
Lymphs Abs: 1.6 10*3/uL (ref 0.7–4.0)
MCH: 31.8 pg (ref 26.0–34.0)
MCHC: 32.2 g/dL (ref 30.0–36.0)
MCV: 98.8 fL (ref 80.0–100.0)
Monocytes Absolute: 0.7 10*3/uL (ref 0.1–1.0)
Monocytes Relative: 7 %
Neutro Abs: 7.3 10*3/uL (ref 1.7–7.7)
Neutrophils Relative %: 74 %
Platelets: 181 10*3/uL (ref 150–400)
RBC: 4.24 MIL/uL (ref 4.22–5.81)
RDW: 13.3 % (ref 11.5–15.5)
WBC: 9.8 10*3/uL (ref 4.0–10.5)
nRBC: 0 % (ref 0.0–0.2)

## 2022-08-26 LAB — COMPREHENSIVE METABOLIC PANEL
ALT: 37 U/L (ref 0–44)
AST: 29 U/L (ref 15–41)
Albumin: 3.2 g/dL — ABNORMAL LOW (ref 3.5–5.0)
Alkaline Phosphatase: 38 U/L (ref 38–126)
Anion gap: 10 (ref 5–15)
BUN: 13 mg/dL (ref 8–23)
CO2: 25 mmol/L (ref 22–32)
Calcium: 8.1 mg/dL — ABNORMAL LOW (ref 8.9–10.3)
Chloride: 101 mmol/L (ref 98–111)
Creatinine, Ser: 1.2 mg/dL (ref 0.61–1.24)
GFR, Estimated: >60 mL/min (ref >60)
Glucose, Bld: 100 mg/dL — ABNORMAL HIGH (ref 70–99)
Potassium: 3.5 mmol/L (ref 3.5–5.1)
Sodium: 136 mmol/L (ref 135–145)
Total Bilirubin: 1.4 mg/dL — ABNORMAL HIGH (ref 0.3–1.2)
Total Protein: 5.9 g/dL — ABNORMAL LOW (ref 6.5–8.1)

## 2022-08-26 LAB — BRAIN NATRIURETIC PEPTIDE: B Natriuretic Peptide: 386.9 pg/mL — ABNORMAL HIGH (ref 0.0–100.0)

## 2022-08-26 LAB — MAGNESIUM: Magnesium: 1.9 mg/dL (ref 1.7–2.4)

## 2022-08-26 LAB — TROPONIN I (HIGH SENSITIVITY)
Troponin I (High Sensitivity): 24 ng/L — ABNORMAL HIGH (ref <18)
Troponin I (High Sensitivity): 27 ng/L — ABNORMAL HIGH (ref <18)

## 2022-08-26 LAB — MRSA NEXT GEN BY PCR, NASAL: MRSA by PCR Next Gen: NOT DETECTED

## 2022-08-26 LAB — RESP PANEL BY RT-PCR (RSV, FLU A&B, COVID)  RVPGX2
Influenza A by PCR: NEGATIVE
Influenza B by PCR: NEGATIVE
Resp Syncytial Virus by PCR: NEGATIVE
SARS Coronavirus 2 by RT PCR: POSITIVE — AB

## 2022-08-26 LAB — CORTISOL: Cortisol, Plasma: 4.6 ug/dL

## 2022-08-26 LAB — TSH: TSH: <0.01 u[IU]/mL — ABNORMAL LOW (ref 0.350–4.500)

## 2022-08-26 LAB — T4, FREE: Free T4: 2.46 ng/dL — ABNORMAL HIGH (ref 0.61–1.12)

## 2022-08-26 MED ORDER — TAMSULOSIN HCL 0.4 MG PO CAPS
0.4000 mg | ORAL_CAPSULE | Freq: Every evening | ORAL | Status: DC
  Administered 2022-08-26 – 2022-08-27 (×2): 0.4 mg via ORAL
  Filled 2022-08-26 (×2): qty 1

## 2022-08-26 MED ORDER — ROPINIROLE HCL 1 MG PO TABS
1.0000 mg | ORAL_TABLET | Freq: Every day | ORAL | Status: DC
  Administered 2022-08-26 – 2022-08-27 (×2): 1 mg via ORAL
  Filled 2022-08-26 (×2): qty 1

## 2022-08-26 MED ORDER — ENOXAPARIN SODIUM 40 MG/0.4ML IJ SOSY
40.0000 mg | PREFILLED_SYRINGE | INTRAMUSCULAR | Status: DC
  Administered 2022-08-26 – 2022-08-27 (×2): 40 mg via SUBCUTANEOUS
  Filled 2022-08-26 (×2): qty 0.4

## 2022-08-26 MED ORDER — LEVOTHYROXINE SODIUM 75 MCG PO TABS
75.0000 ug | ORAL_TABLET | Freq: Every day | ORAL | Status: DC
  Administered 2022-08-27 – 2022-08-28 (×2): 75 ug via ORAL
  Filled 2022-08-26 (×2): qty 1

## 2022-08-26 MED ORDER — SODIUM CHLORIDE 0.9 % IV BOLUS
1000.0000 mL | Freq: Once | INTRAVENOUS | Status: AC
  Administered 2022-08-26: 1000 mL via INTRAVENOUS

## 2022-08-26 MED ORDER — SODIUM CHLORIDE 0.9 % IV SOLN: Freq: Once | INTRAVENOUS | Status: AC

## 2022-08-26 MED ORDER — ACETAMINOPHEN 500 MG PO TABS
1000.0000 mg | ORAL_TABLET | Freq: Once | ORAL | Status: AC
  Administered 2022-08-26: 1000 mg via ORAL
  Filled 2022-08-26: qty 2

## 2022-08-26 MED ORDER — SODIUM CHLORIDE 0.9 % IV BOLUS
500.0000 mL | Freq: Once | INTRAVENOUS | Status: AC
  Administered 2022-08-26: 500 mL via INTRAVENOUS

## 2022-08-26 MED ORDER — PREDNISONE 10 MG PO TABS
5.0000 mg | ORAL_TABLET | Freq: Two times a day (BID) | ORAL | Status: DC
  Administered 2022-08-26 – 2022-08-27 (×2): 5 mg via ORAL
  Filled 2022-08-26 (×2): qty 1

## 2022-08-26 MED ORDER — PREDNISONE 5 MG PO TABS
10.0000 mg | ORAL_TABLET | Freq: Once | ORAL | Status: AC
  Administered 2022-08-26: 10 mg via ORAL
  Filled 2022-08-26: qty 2

## 2022-08-26 MED ORDER — DIAZEPAM 5 MG/ML IJ SOLN
5.0000 mg | Freq: Once | INTRAMUSCULAR | Status: AC
  Administered 2022-08-26: 5 mg via INTRAVENOUS
  Filled 2022-08-26: qty 2

## 2022-08-26 MED ORDER — FINASTERIDE 5 MG PO TABS
5.0000 mg | ORAL_TABLET | Freq: Every day | ORAL | Status: DC
  Administered 2022-08-26 – 2022-08-28 (×3): 5 mg via ORAL
  Filled 2022-08-26 (×3): qty 1

## 2022-08-26 NOTE — ED Notes (Signed)
ED TO INPATIENT HANDOFF REPORT  ED Nurse Name and Phone #:  Lorenda Ishihara [phone]    S Name/Age/Gender Marcene Duos Key 82 y.o. male Room/Bed: 026C/026C  Code Status   Code Status: Full Code  Home/SNF/Other Home Patient oriented to: self, place, time, and situation Is this baseline? Yes   Triage Complete: Triage complete  Chief Complaint COVID-19 virus infection [U07.1]  Triage Note Pt BIB [facility] from home c/o getting up to use the bathroom and thought he was closer to his bed but was not got weak and went down to the ground and landed on his left knee. Pt is c/o generalized weakness that has been on going x1 week and pain in his left knee. Pt states he had his medication increased in the last week and has been weak since. EMS gave 500 NS    Allergies Allergies  Allergen Reactions   Trazodone     Other reaction(s): too sedating    Level of Care/Admitting Diagnosis ED Disposition     ED Disposition  Admit   Condition  --   [place]: [place] [[id number]]  Level of Care: Progressive [102]  Admit to Progressive based on following criteria: RESPIRATORY PROBLEMS hypoxemic/hypercapnic respiratory failure that is responsive to NIPPV (BiPAP) or High Flow Nasal Cannula (6-80 lpm). Frequent assessment/intervention, no > Q2 hrs < Q4 hrs, to maintain oxygenation and pulmonary hygiene.  Admit to Progressive based on following criteria: MULTISYSTEM THREATS such as stable sepsis, metabolic/electrolyte imbalance with or without encephalopathy that is responding to early treatment.  May admit patient to [facility] or [facility] if equivalent level of care is available:: No  Covid Evaluation: Confirmed COVID Positive  Diagnosis: COVID-19 virus infection [[id number]]  Admitting Physician: Little Ishikawa [[id number]]  Attending Physician: Little Ishikawa [[id number]]  Certification:: I certify this patient will need inpatient services for at least 2 midnights   Estimated Length of Stay: 4          B Medical/Surgery History Past Medical History:  Diagnosis Date   Adrenal insufficiency ([place])    Hypertension    Hypothyroidism    Macroadenoma    Pituitary mass ([place]) 2020   Prediabetes    Past Surgical History:  Procedure Laterality Date   BILATERAL KNEE ARTHROSCOPY  2010   CYST REMOVAL TRUNK  2010     A IV Location/Drains/Wounds Patient Lines/Drains/Airways Status     Active Line/Drains/Airways     Name Placement date Placement time Site Days   Peripheral IV 08/26/22 18 G Left Antecubital 08/26/22  0502  Antecubital  less than 1            Intake/Output Last 24 hours  Intake/Output Summary (Last 24 hours) at 08/26/2022 1639 Last data filed at 08/26/2022 0950 Gross per 24 hour  Intake 698.99 ml  Output --  Net 698.99 ml    Labs/Imaging Results for orders placed or performed during the hospital encounter of 08/26/22 (from the past 48 hour(s))  CBC with Differential     Status: None   Collection Time: 08/26/22  5:23 AM  Result Value Ref Range   WBC 9.8 4.0 - 10.5 K/uL   RBC 4.24 4.22 - 5.81 MIL/uL   Hemoglobin 13.5 13.0 - 17.0 g/dL   HCT 41.9 39.0 - 52.0 %   MCV 98.8 80.0 - 100.0 fL   MCH 31.8 26.0 - 34.0 pg   MCHC 32.2 30.0 - 36.0 g/dL   RDW 13.3 11.5 - 15.5 %  Platelets 181 150 - 400 K/uL   nRBC 0.0 0.0 - 0.2 %   Neutrophils Relative % 74 %   Neutro Abs 7.3 1.7 - 7.7 K/uL   Lymphocytes Relative 16 %   Lymphs Abs 1.6 0.7 - 4.0 K/uL   Monocytes Relative 7 %   Monocytes Absolute 0.7 0.1 - 1.0 K/uL   Eosinophils Relative 1 %   Eosinophils Absolute 0.1 0.0 - 0.5 K/uL   Basophils Relative 1 %   Basophils Absolute 0.1 0.0 - 0.1 K/uL   Immature Granulocytes 1 %   Abs Immature Granulocytes 0.07 0.00 - 0.07 K/uL    Comment: Performed at [facility] [address], [place], [place] [address]  Comprehensive metabolic panel     Status: Abnormal   Collection Time: 08/26/22  5:23 AM  Result Value Ref Range    Sodium 136 135 - 145 mmol/L   Potassium 3.5 3.5 - 5.1 mmol/L   Chloride 101 98 - 111 mmol/L   CO2 25 22 - 32 mmol/L   Glucose, Bld 100 (H) 70 - 99 mg/dL    Comment: Glucose reference range applies only to samples taken after fasting for at least 8 hours.   BUN 13 8 - 23 mg/dL   Creatinine, Ser 1.20 0.61 - 1.24 mg/dL   Calcium 8.1 (L) 8.9 - 10.3 mg/dL   Total Protein 5.9 (L) 6.5 - 8.1 g/dL   Albumin 3.2 (L) 3.5 - 5.0 g/dL   AST 29 15 - 41 U/L   ALT 37 0 - 44 U/L   Alkaline Phosphatase 38 38 - 126 U/L   Total Bilirubin 1.4 (H) 0.3 - 1.2 mg/dL   GFR, Estimated >60 >60 mL/min    Comment: (NOTE) Calculated using the CKD-EPI Creatinine Equation (2021)    Anion gap 10 5 - 15    Comment: Performed at [facility], [place] [address], [place], [place] [address]  T4, free     Status: Abnormal   Collection Time: 08/26/22  5:23 AM  Result Value Ref Range   Free T4 2.46 (H) 0.61 - 1.12 ng/dL    Comment: (NOTE) Biotin ingestion may interfere with free T4 tests. If the results are inconsistent with the TSH level, previous test results, or the clinical presentation, then consider biotin interference. If needed, order repeat testing after stopping biotin. Performed at [facility], [place] [address], [place], [place] [address]   Troponin I (High Sensitivity)     Status: Abnormal   Collection Time: 08/26/22  5:23 AM  Result Value Ref Range   Troponin I (High Sensitivity) 24 (H) <18 ng/L    Comment: (NOTE) Elevated high sensitivity troponin I (hsTnI) values and significant  changes across serial measurements may suggest ACS but many other  chronic and acute conditions are known to elevate hsTnI results.  Refer to the "Links" section for chest pain algorithms and additional  guidance. Performed at [facility], [place] [address] [place], [place] [address]   Magnesium     Status: None   Collection Time: 08/26/22  5:23 AM  Result Value Ref Range   Magnesium 1.9 1.7 - 2.4 mg/dL     Comment: Performed at [facility] [address], [place], [place] [address]  Cortisol     Status: None   Collection Time: 08/26/22  5:24 AM  Result Value Ref Range   Cortisol, Plasma 4.6 ug/dL    Comment: (NOTE) AM  6.7 - 22.6 ug/dL PM   <10.0       ug/dL Performed at [facility] [address], [place], [place] [address]   TSH     Status: Abnormal   Collection Time: 08/26/22  5:24 AM  Result Value Ref Range   TSH <0.010 (L) 0.350 - 4.500 uIU/mL    Comment: Performed by a 3rd Generation assay with a functional sensitivity of <=0.01 uIU/mL. Performed at [facility], [place] [address] [place], [place] [address]   Resp panel by RT-PCR (RSV, Flu A&B, Covid) Anterior Nasal Swab     Status: Abnormal   Collection Time: 08/26/22  5:25 AM   Specimen: Anterior Nasal Swab  Result Value Ref Range   SARS Coronavirus 2 by RT PCR POSITIVE (A) NEGATIVE    Comment: (NOTE) SARS-CoV-2 target nucleic acids are DETECTED.  The SARS-CoV-2 RNA is generally detectable in upper respiratory specimens during the acute phase of infection. Positive results are indicative of the presence of the identified virus, but do not rule out bacterial infection or co-infection with other pathogens not detected by the test. Clinical correlation with patient history and other diagnostic information is necessary to determine patient infection status. The expected result is Negative.  Fact Sheet for Patients: EntrepreneurPulse.com.au  Fact Sheet for Healthcare Providers: IncredibleEmployment.be  This test is not yet approved or cleared by the [place] FDA and  has been authorized for detection and/or diagnosis of SARS-CoV-2 by FDA under an Emergency Use Authorization (EUA).  This EUA will remain in effect (meaning this test can be used) for the duration of  the COVID-19 declaration under Section 564(b)(1) of the A ct, 21 U.S.C. section  360bbb-3(b)(1), unless the authorization is terminated or revoked sooner.     Influenza A by PCR NEGATIVE NEGATIVE   Influenza B by PCR NEGATIVE NEGATIVE    Comment: (NOTE) The Xpert Xpress SARS-CoV-2/FLU/RSV plus assay is intended as an aid in the diagnosis of influenza from Nasopharyngeal swab specimens and should not be used as a sole basis for treatment. Nasal washings and aspirates are unacceptable for Xpert Xpress SARS-CoV-2/FLU/RSV testing.  Fact Sheet for Patients: EntrepreneurPulse.com.au  Fact Sheet for Healthcare Providers: IncredibleEmployment.be  This test is not yet approved or cleared by the [place] FDA and has been authorized for detection and/or diagnosis of SARS-CoV-2 by FDA under an Emergency Use Authorization (EUA). This EUA will remain in effect (meaning this test can be used) for the duration of the COVID-19 declaration under Section 564(b)(1) of the Act, 21 U.S.C. section 360bbb-3(b)(1), unless the authorization is terminated or revoked.     Resp Syncytial Virus by PCR NEGATIVE NEGATIVE    Comment: (NOTE) Fact Sheet for Patients: EntrepreneurPulse.com.au  Fact Sheet for Healthcare Providers: IncredibleEmployment.be  This test is not yet approved or cleared by the [place] FDA and has been authorized for detection and/or diagnosis of SARS-CoV-2 by FDA under an Emergency Use Authorization (EUA). This EUA will remain in effect (meaning this test can be used) for the duration of the COVID-19 declaration under Section 564(b)(1) of the Act, 21 U.S.C. section 360bbb-3(b)(1), unless the authorization is terminated or revoked.  Performed at [facility], [place] [address], [place], [place] [address]   Brain natriuretic peptide     Status: Abnormal   Collection Time: 08/26/22  5:42 AM  Result Value Ref Range   B Natriuretic Peptide 386.9 (H) 0.0 - 100.0 pg/mL    Comment:  Performed at  [facility], [place] [address] [place], [place] [address]  Urinalysis, Routine w reflex microscopic -Urine, Clean Catch     Status: Abnormal   Collection Time: 08/26/22  8:52 AM  Result Value Ref Range   Color, Urine YELLOW YELLOW   APPearance HAZY (A) CLEAR   Specific Gravity, Urine 1.012 1.005 - 1.030   pH 6.0 5.0 - 8.0   Glucose, UA NEGATIVE NEGATIVE mg/dL   Hgb urine dipstick NEGATIVE NEGATIVE   Bilirubin Urine NEGATIVE NEGATIVE   Ketones, ur NEGATIVE NEGATIVE mg/dL   Protein, ur 30 (A) NEGATIVE mg/dL   Nitrite NEGATIVE NEGATIVE   Leukocytes,Ua NEGATIVE NEGATIVE   RBC / HPF 0-5 0 - 5 RBC/hpf   WBC, UA 0-5 0 - 5 WBC/hpf   Bacteria, UA NONE SEEN NONE SEEN   Squamous Epithelial / HPF 0-5 0 - 5 /HPF   Mucus PRESENT    Hyaline Casts, UA PRESENT     Comment: Performed at [facility] [address], [place], [place] [address]  Troponin I (High Sensitivity)     Status: Abnormal   Collection Time: 08/26/22 11:01 AM  Result Value Ref Range   Troponin I (High Sensitivity) 27 (H) <18 ng/L    Comment: (NOTE) Elevated high sensitivity troponin I (hsTnI) values and significant  changes across serial measurements may suggest ACS but many other  chronic and acute conditions are known to elevate hsTnI results.  Refer to the "Links" section for chest pain algorithms and additional  guidance. Performed at [facility], [place] [address], [place], [place] [address]    CT Head Wo Contrast  Result Date: 08/26/2022 CLINICAL DATA:  82 year old male status post fall. History of pituitary macro adenoma. EXAM: CT HEAD WITHOUT CONTRAST TECHNIQUE: Contiguous axial images were obtained from the base of the skull through the vertex without intravenous contrast. RADIATION DOSE REDUCTION: This exam was performed according to the departmental dose-optimization program which includes automated exposure control, adjustment of the mA and/or kV according to patient size and/or use of  iterative reconstruction technique. COMPARISON:  Brain MRI 06/04/2019.  Head CT 07/26/2021. FINDINGS: Brain: Cerebral volume is within normal limits for age. No midline shift or ventriculomegaly. 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 No superimposed acute intracranial hemorrhage. No cortically based acute infarct identified. Gray-white differentiation is stable and within normal limits for age. Vascular: Calcified atherosclerosis at the skull base. No suspicious intracranial vascular hyperdensity. Skull: No acute osseous abnormality identified. Sinuses/Orbits: Moderate bilateral paranasal sinus mucosal thickening now, most pronounced in the ethmoids. Some associated layering sinus fluid. Tympanic cavities and mastoids remain clear. Other: Chronic posterior convexity scalp soft tissue scarring. Visualized orbit soft tissues are within normal limits. IMPRESSION: 1. No acute traumatic injury identified. 2. Chronic pituitary mass is stable to minimally larger (by about 1 mm) since 2020. No acute intracranial abnormality. 3. Moderate paranasal sinus inflammation is new since 2022. Consider acute sinusitis in the appropriate clinical setting. Electronically Signed   By: Genevie Ann M.D.   On: 08/26/2022 07:09   DG Chest Port 1 View  Result Date: 08/26/2022 CLINICAL DATA:  82 year old male with history of shortness of breath. EXAM: PORTABLE CHEST 1 VIEW COMPARISON:  Chest x-ray 10/07/2018. FINDINGS: Lung volumes are normal. Opacity in the periphery of the left base which may reflect atelectasis and/or consolidation, potentially with superimposed small left pleural effusion. Right lung is  clear. No right pleural effusion. No pneumothorax. No evidence of pulmonary edema. Heart size is borderline enlarged, accentuated by portable AP technique and mild patient rotation to the left. Upper mediastinal contours are within normal  limits. Old healed fracture of the posterolateral right eighth rib incidentally noted. IMPRESSION: 1. Area of atelectasis and/or consolidation in the periphery of the left lung base, potentially with small superimposed left pleural effusion. In the appropriate clinical setting, this could represent a focus of pneumonia, however, if there is history of chest pain, infarcts from pulmonary embolism should also be considered. If clinically appropriate, PE protocol CT scan could be considered. Electronically Signed   By: Vinnie Langton M.D.   On: 08/26/2022 06:01    Pending Labs Unresulted Labs (From admission, onward)     Start     Ordered   09/02/22 0500  Creatinine, serum  (enoxaparin (LOVENOX)    CrCl >/= 30 ml/min)  Weekly,   R     Comments: while on enoxaparin therapy    08/26/22 1158   08/27/22 0500  CBC  Daily,   R      08/26/22 1158   08/27/22 5462  Basic metabolic panel  Daily,   R      08/26/22 1158   08/26/22 1156  Culture, blood (Routine X 2) w Reflex to ID Panel  BLOOD CULTURE X 2,   R      08/26/22 1156   08/26/22 1156  Urine Culture (for pregnant, neutropenic or urologic patients or patients with an indwelling urinary catheter)  (Urine Labs)  Once,   R       Question:  Indication  Answer:  Altered mental status (if no other cause identified)   08/26/22 1156            Vitals/Pain Today's Vitals   08/26/22 1447 08/26/22 1500 08/26/22 1600 08/26/22 1630  BP:  97/71 96/60 104/71  Pulse:  66 64 65  Resp:  (!) 23 (!) 24 (!) 21  Temp: 98 F (36.7 C)     TempSrc: Oral     SpO2:  98% 96% 97%  Weight:      Height:      PainSc:        Isolation Precautions Airborne and Contact precautions  Medications Medications  enoxaparin (LOVENOX) injection 40 mg (has no administration in time range)  rOPINIRole (REQUIP) tablet 1 mg (has no administration in time range)  finasteride (PROSCAR) tablet 5 mg (5 mg Oral Given 08/26/22 1615)  tamsulosin (FLOMAX) capsule 0.4 mg (has no  administration in time range)  levothyroxine (SYNTHROID) tablet 75 mcg (has no administration in time range)  predniSONE (DELTASONE) tablet 5 mg (5 mg Oral Given 08/26/22 1615)  0.9 %  sodium chloride infusion (0 mLs Intravenous Stopped 08/26/22 0815)  acetaminophen (TYLENOL) tablet 1,000 mg (1,000 mg Oral Given 08/26/22 0836)  diazepam (VALIUM) injection 5 mg (5 mg Intravenous Given 08/26/22 0838)  sodium chloride 0.9 % bolus 500 mL (0 mLs Intravenous Stopped 08/26/22 0950)  sodium chloride 0.9 % bolus 1,000 mL (1,000 mLs Intravenous New Bag/Given 08/26/22 1215)  predniSONE (DELTASONE) tablet 10 mg (10 mg Oral Given 08/26/22 1321)    Mobility  Unknown has not ambulated on floor.     Focused Assessments Cardiac Assessment Handoff:    No results found for: "CKTOTAL", "CKMB", "CKMBINDEX", "TROPONINI" Lab Results  Component Value Date   DDIMER 0.54 (H) 10/07/2018   Does the Patient currently have chest pain? No   ,  Neuro Assessment Handoff:  Swallow screen pass? Yes          Neuro Assessment:   Neuro Checks:      Has TPA been given? No If patient is a Neuro Trauma and patient is going to OR before floor call report to [place] nurse: [phone] or [phone]   R Recommendations: See Admitting Provider Note  Report given to:   Additional Notes:  Patient is on RA, swallows fine, agreeable demeanor.

## 2022-08-26 NOTE — ED Triage Notes (Signed)
Pt BIB [facility] from home c/o getting up to use the bathroom and thought he was closer to his bed but was not got weak and went down to the ground and landed on his left knee. Pt is c/o generalized weakness that has been on going x1 week and pain in his left knee. Pt states he had his medication increased in the last week and has been weak since. EMS gave 500 NS

## 2022-08-26 NOTE — ED Notes (Addendum)
RN called to inform staff that patient is coming upstairs to 2c room 14 NA. Patient will be transferred per protocol.

## 2022-08-26 NOTE — ED Notes (Signed)
PT/OT stopped by to see pt but d/t pt's current BP stated they would not be able to see him at this time.

## 2022-08-26 NOTE — ED Notes (Signed)
Pt was given valium and is now currently sleeping. Pt's oxygen saturation drops to 88-89% on RA while he is sleeping. Pt placed on 2L [place].

## 2022-08-26 NOTE — ED Provider Notes (Signed)
[facility] Provider Note  CSN: [id number] Arrival date & time: 08/26/22 0500  Chief Complaint(s) No chief complaint on file.  HPI Julian Harris is a 82 y.o. male with a past medical history listed below including adrenal insufficiency due to pituitary macroadenoma on daily prednisone who presents to the emergency department with generalized fatigue and weakness causing mechanical fall today.  Patient was trying to get up out of bed and states that his legs gave out.  He fell to the ground and landed on his left knee.  Denies any head trauma or loss of consciousness.  Denies any headache, neck pain, back pain, chest pain, abdominal pain or extremity pain.  Able to range his left knee without discomfort.  Patient that for the last week he has felt ill.  States that he has had to increase his prednisone given instructions by his endocrinologist stating to double while ill.  Because of this he reports he has not be sleeping well because of the increased dosing.  Wife at bedside states that he has had several falls this week due to his generalized weakness. HPI  Past Medical History Past Medical History:  Diagnosis Date   Adrenal insufficiency ([place])    Hypertension    Hypothyroidism    Macroadenoma    Pituitary mass ([place]) 2020   Prediabetes    Patient Active Problem List   Diagnosis Date Noted   Generalized abdominal pain 07/05/2021   Bloating 07/05/2021   Adrenal insufficiency ([place]) 04/23/2020   Orthostatic hypotension 04/23/2020   Pituitary macroadenoma ([place]) 04/23/2020   Exertional dyspnea 04/23/2020   Grief reaction with prolonged bereavement    Syncope 10/07/2018   Essential hypertension    Epidermal cyst of neck 11/25/2013   Home Medication(s) Prior to Admission medications   Medication Sig Start Date End Date Taking? Authorizing Provider  finasteride (PROSCAR) 5 MG tablet Take 1 tablet by mouth daily.    [provider]  levothyroxine (SYNTHROID) 75 MCG tablet Take 1 tablet by mouth daily.    [provider]  predniSONE (DELTASONE) 5 MG tablet Take 5 mg by mouth 2 (two) times daily with a meal. 04/05/20   [provider]  rOPINIRole (REQUIP) 1 MG tablet Take 1 mg by mouth at bedtime. Taking 1-3 hrs before bedtime. 03/11/20   [provider]  tamsulosin (FLOMAX) 0.4 MG CAPS capsule Take 1 capsule by mouth every evening. 03/09/20   [provider]                                                                                                                                    Allergies Trazodone  Review of Systems Review of Systems As noted in HPI  Physical Exam Vital Signs  I have reviewed the triage vital signs BP 97/80   Pulse 85   Temp (!) 100.4 F (38 C) (Oral)  Resp 18   Ht '5\' 10"'$  (1.778 m)   Wt 97.5 kg   SpO2 95%   BMI 30.85 kg/m   Physical Exam Vitals reviewed.  Constitutional:      General: He is not in acute distress.    Appearance: He is well-developed. He is not diaphoretic.  HENT:     Head: Normocephalic and atraumatic.     Nose: Nose normal.  Eyes:     General: No scleral icterus.       Right eye: No discharge.        Left eye: No discharge.     Conjunctiva/sclera:     Right eye: Right conjunctiva is injected.     Left eye: Left conjunctiva is injected.     Pupils: Pupils are equal, round, and reactive to light.  Cardiovascular:     Rate and Rhythm: Normal rate and regular rhythm.     Heart sounds: No murmur heard.    No friction rub. No gallop.  Pulmonary:     Effort: Pulmonary effort is normal. No respiratory distress.     Breath sounds: Normal breath sounds. No stridor. No rales.  Abdominal:     General: There is no distension.     Palpations: Abdomen is soft.     Tenderness: There is no abdominal tenderness.  Musculoskeletal:        General: No tenderness.     Cervical back: Normal range of motion and neck supple. No bony  tenderness.     Thoracic back: No bony tenderness.     Lumbar back: No bony tenderness.     Right hip: No tenderness.     Left hip: No tenderness.     Right upper leg: No tenderness.     Left upper leg: No tenderness.     Right knee: Normal range of motion. No tenderness.     Left knee: Normal range of motion. No tenderness.  Skin:    General: Skin is warm and dry.     Findings: No erythema or rash.  Neurological:     Mental Status: He is alert and oriented to person, place, and time.     ED Results and Treatments Labs (all labs ordered are listed, but only abnormal results are displayed) Labs Reviewed  RESP PANEL BY RT-PCR (RSV, FLU A&B, COVID)  [id number] - Abnormal; Notable for the following components:      Result Value   SARS Coronavirus 2 by RT PCR POSITIVE (*)    All other components within normal limits  COMPREHENSIVE METABOLIC PANEL - Abnormal; Notable for the following components:   Glucose, Bld 100 (*)    Calcium 8.1 (*)    Total Protein 5.9 (*)    Albumin 3.2 (*)    Total Bilirubin 1.4 (*)    All other components within normal limits  T4, FREE - Abnormal; Notable for the following components:   Free T4 2.46 (*)    All other components within normal limits  BRAIN NATRIURETIC PEPTIDE - Abnormal; Notable for the following components:   B Natriuretic Peptide 386.9 (*)    All other components within normal limits  TSH - Abnormal; Notable for the following components:   TSH <0.010 (*)    All other components within normal limits  TROPONIN I (HIGH SENSITIVITY) - Abnormal; Notable for the following components:   Troponin I (High Sensitivity) 24 (*)    All other components within normal limits  CBC WITH DIFFERENTIAL/PLATELET  CORTISOL  MAGNESIUM  URINALYSIS, ROUTINE W REFLEX MICROSCOPIC  TROPONIN I (HIGH SENSITIVITY)                                                                                                                         EKG   Radiology CT Head Wo  Contrast  Result Date: 08/26/2022 CLINICAL DATA:  82 year old male status post fall. History of pituitary macro adenoma. EXAM: CT HEAD WITHOUT CONTRAST TECHNIQUE: Contiguous axial images were obtained from the base of the skull through the vertex without intravenous contrast. RADIATION DOSE REDUCTION: This exam was performed according to the departmental dose-optimization program which includes automated exposure control, adjustment of the mA and/or kV according to patient size and/or use of iterative reconstruction technique. COMPARISON:  Brain MRI 06/04/2019.  Head CT 07/26/2021. FINDINGS: Brain: Cerebral volume is within normal limits for age. No midline shift or ventriculomegaly. 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 No superimposed acute intracranial hemorrhage. No cortically based acute infarct identified. Gray-white differentiation is stable and within normal limits for age. Vascular: Calcified atherosclerosis at the skull base. No suspicious intracranial vascular hyperdensity. Skull: No acute osseous abnormality identified. Sinuses/Orbits: Moderate bilateral paranasal sinus mucosal thickening now, most pronounced in the ethmoids. Some associated layering sinus fluid. Tympanic cavities and mastoids remain clear. Other: Chronic posterior convexity scalp soft tissue scarring. Visualized orbit soft tissues are within normal limits. IMPRESSION: 1. No acute traumatic injury identified. 2. Chronic pituitary mass is stable to minimally larger (by about 1 mm) since 2020. No acute intracranial abnormality. 3. Moderate paranasal sinus inflammation is new since 2022. Consider acute sinusitis in the appropriate clinical setting. Electronically Signed   By: Genevie Ann M.D.   On: 08/26/2022 07:09   DG Chest Port 1 View  Result Date: 08/26/2022 CLINICAL DATA:  82 year old male with history of shortness of breath. EXAM:  PORTABLE CHEST 1 VIEW COMPARISON:  Chest x-ray 10/07/2018. FINDINGS: Lung volumes are normal. Opacity in the periphery of the left base which may reflect atelectasis and/or consolidation, potentially with superimposed small left pleural effusion. Right lung is clear. No right pleural effusion. No pneumothorax. No evidence of pulmonary edema. Heart size is borderline enlarged, accentuated by portable AP technique and mild patient rotation to the left. Upper mediastinal contours are within normal limits. Old healed fracture of the posterolateral right eighth rib incidentally noted. IMPRESSION: 1. Area of atelectasis and/or consolidation in the periphery of the left lung base, potentially with small superimposed left pleural effusion. In the appropriate clinical setting, this could represent a focus of pneumonia, however, if there is history of chest pain, infarcts from pulmonary embolism should also be considered. If clinically appropriate, PE protocol CT scan could be considered. Electronically Signed   By: Vinnie Langton M.D.   On: 08/26/2022 06:01    Medications Ordered in ED Medications  acetaminophen (TYLENOL) tablet 1,000 mg (has no administration in time range)  diazepam (  VALIUM) injection 5 mg (has no administration in time range)  0.9 %  sodium chloride infusion ( Intravenous New Bag/Given 08/26/22 0553)                                                                                                                                     Procedures Procedures  (including critical care time)  Medical Decision Making / ED Course   Medical Decision Making Amount and/or Complexity of Data Reviewed Labs: ordered. Decision-making details documented in ED Course. Radiology: ordered and independent interpretation performed. Decision-making details documented in ED Course. ECG/medicine tests: ordered and independent interpretation performed. Decision-making details documented in ED  Course.  Risk OTC drugs. Prescription drug management.    Patient presents for mechanical fall in the setting of generalized fatigue from feeling ill. Differential includes but not limited to infectious symptoms, adrenal insufficiency, electrolyte/metabolic derangement, severe anemia, heart failure though feel this is less likely. On secondary exam, no evidence of severe injuries from the fall requiring imaging at this time. However will get CT head to assess for ICH or mass effect.  CBC without leukocytosis or anemia Metabolic panel without significant electrolyte derangements or renal insufficiency. Troponin slightly elevated BNP greater than 380 Cortisol level within normal limits Viral panel positive for COVID.  Chest x-ray without evidence of pneumonia, pneumothorax, pulmonary edema or pleural effusion.  There is a density in the left lower lobe noted by radiologist with possible effusion. CT head without ICH or mass effect.  No significant change to his pituitary adenoma.  Given the recurrent falls due to generalized fatigue in setting of being COVID + with history of adrenal insufficiency, I consulted hospitalist service who will admit.         Final Clinical Impression(s) / ED Diagnoses Final diagnoses:  [id number] virus infection  General weakness  H/O adrenal insufficiency           This chart was dictated using voice recognition software.  Despite best efforts to proofread,  errors can occur which can change the documentation meaning.    Fatima Blank, MD 08/26/22 [phone]

## 2022-08-26 NOTE — ED Notes (Signed)
Pt. Does not feel safe standing up for orthostatic VS

## 2022-08-26 NOTE — Progress Notes (Signed)
PT Cancellation Note  Patient Details Name: DEO MEHRINGER MRN: [id number] DOB: [birth year]   Cancelled Treatment:    Reason Eval/Treat Not Completed: Medical issues which prohibited therapy (Automatic BP 61/45 (50), manual BP taken by NT 62/50 with associated diaphoresis)  Wyona Almas, PT, DPT Acute Rehabilitation Services Office [phone]    Deno Etienne 08/26/2022, 11:38 AM

## 2022-08-26 NOTE — ED Notes (Signed)
This EMT performed manual blood pressure for this patient on each upper extremity receiving pressures as noted with additional pressures to confirm measurement. The patient was diaphoretic during the time of taking said pressures. The RN was notified of the findings.

## 2022-08-26 NOTE — H&P (Signed)
History and Physical    MAYFIELD SCHOENE HBZ:[id number] DOB: [birth year] DOA: 08/26/2022  PCP: Janie Morning, DO   Chief Complaint: Weakness, falls  HPI: Julian Harris is a 82 y.o. male with medical history significant of adrenal insufficiency, hypertension with chronic pituitary macroadenoma.  Patient presents to the ED with apparently 2 to 3 days of worsening ambulatory status and falls.  He denies loss of consciousness nausea vomiting chest pain shortness of breath headache fevers or chills.  In the ED patient was evaluated with negative imaging in the setting of recent falls.  Routine swab was positive for COVID although patient denies any shortness of breath cough fevers chills recent travel or sick contacts.  Hospitalist called for admission for COVID-positive status and falls.  Review of Systems: As per HPI, otherwise negative.   Assessment/Plan Principal Problem:   Orthostatic hypotension Active Problems:   Essential hypertension   Adrenal insufficiency (HCC)   Pituitary macroadenoma ([facility])   COVID-19 virus infection   Acute metabolic encephalopathy    Acute adrenal crisis  -Patient appears to be somewhat noncompliant with prednisone as it interferes with his sleep cycle -Single 10 mg prednisone dose today with moderate improvement in patient's symptoms and hypotension -will continue 5 mg twice daily hereafter and titrate accordingly. -Likely triggered by hypotension and questionable infection as below -Cortisol level within normal limits, TSH undetectably low, free T4 elevated at 1.01  Acute metabolic encephalopathy, multifactorial Polypharmacy Covid encephalopathy, less likely -Patient Aox4 at intake - subsequently obtunded, unable to answer questions appropriately after IV diazepam -of note patient does not take benzodiazepines at baseline is likely quite nave -Hold all CNS depressants at this time. Could be covid encephalopathy but would not expect such a drastic change in  this short amount of time. -CT head unremarkable -Adrenal insufficiency/hypotension likely playing a partial role -Cultures collected to rule out any underlying infection  Ambulatory dysfunction Orthostatic hypotension Patient presented with ambulatory dysfunction and falls with positive orthostatics in the ED Continue IV fluid challenge and follow repeat orthostatic vitals PT/OT to follow when improved  DVT prophylaxis: Lovenox Code Status: Full Family Communication: None present Status is: Inpatient  Dispo: The patient is from: Home              Anticipated d/c is to: To be determined              Anticipated d/c date is: 48 to 72 hours pending clinical course              Patient currently not medically stable for discharge due to above altered mental status adrenal crisis and concern over acute infection  Consultants:  None  Procedures:  None   Past Medical History:  Diagnosis Date   Adrenal insufficiency ([place])    Hypertension    Hypothyroidism    Macroadenoma    Pituitary mass ([place]) 2020   Prediabetes     Past Surgical History:  Procedure Laterality Date   BILATERAL KNEE ARTHROSCOPY  2010   CYST REMOVAL TRUNK  2010     reports that he quit smoking about 48 years ago. His smoking use included cigarettes. He has never used smokeless tobacco. He reports current alcohol use. He reports that he does not use drugs.  Allergies  Allergen Reactions   Trazodone     Other reaction(s): too sedating    Family History  Problem Relation Age of Onset   Heart disease Father        coronary stents,  valve replacement    Prior to Admission medications   Medication Sig Start Date End Date Taking? Authorizing Provider  finasteride (PROSCAR) 5 MG tablet Take 1 tablet by mouth daily.    [provider]  levothyroxine (SYNTHROID) 75 MCG tablet Take 1 tablet by mouth daily.    [provider]  predniSONE (DELTASONE) 5 MG tablet Take 5 mg by mouth 2 (two)  times daily with a meal. 04/05/20   [provider]  rOPINIRole (REQUIP) 1 MG tablet Take 1 mg by mouth at bedtime. Taking 1-3 hrs before bedtime. 03/11/20   [provider]  tamsulosin (FLOMAX) 0.4 MG CAPS capsule Take 1 capsule by mouth every evening. 03/09/20   [provider]    Physical Exam: Vitals:   08/26/22 1130 08/26/22 1137 08/26/22 1138 08/26/22 1145  BP: 90/76   (!) 87/64  Pulse: 69 73 66 66  Resp: (!) '23 18 18 16  '$ Temp:      TempSrc:      SpO2: 95% (!) 88% (!) 86% 93%  Weight:      Height:        Constitutional: NAD, calm, comfortable Vitals:   08/26/22 1130 08/26/22 1137 08/26/22 1138 08/26/22 1145  BP: 90/76   (!) 87/64  Pulse: 69 73 66 66  Resp: (!) '23 18 18 16  '$ Temp:      TempSrc:      SpO2: 95% (!) 88% (!) 86% 93%  Weight:      Height:       General:  Pleasantly resting in bed, No acute distress. HEENT:  Normocephalic atraumatic.  Sclerae nonicteric, noninjected.  Extraocular movements intact bilaterally. Neck:  Without mass or deformity.  Trachea is midline. Lungs:  Clear to auscultate bilaterally without rhonchi, wheeze, or rales. Heart:  Regular rate and rhythm.  Without murmurs, rubs, or gallops. Abdomen:  Soft, nontender, nondistended.  Without guarding or rebound. Extremities: Without cyanosis, clubbing, edema, or obvious deformity. Vascular:  Dorsalis pedis and posterior tibial pulses palpable bilaterally. Skin:  Warm and dry, no erythema, no ulcerations.  Labs on Admission: I have personally reviewed following labs and imaging studies  CBC: Recent Labs  Lab 08/26/22 0523  WBC 9.8  NEUTROABS 7.3  HGB 13.5  HCT 41.9  MCV 98.8  PLT 867   Basic Metabolic Panel: Recent Labs  Lab 08/26/22 0523  NA 136  K 3.5  CL 101  CO2 25  GLUCOSE 100*  BUN 13  CREATININE 1.20  CALCIUM 8.1*  MG 1.9   GFR: Estimated Creatinine Clearance: 55.6 mL/min (by C-G formula based on SCr of 1.2 mg/dL). Liver Function  Tests: Recent Labs  Lab 08/26/22 0523  AST 29  ALT 37  ALKPHOS 38  BILITOT 1.4*  PROT 5.9*  ALBUMIN 3.2*   No results for input(s): "LIPASE", "AMYLASE" in the last 168 hours. No results for input(s): "AMMONIA" in the last 168 hours. Coagulation Profile: No results for input(s): "INR", "PROTIME" in the last 168 hours. Cardiac Enzymes: No results for input(s): "CKTOTAL", "CKMB", "CKMBINDEX", "TROPONINI" in the last 168 hours. BNP (last 3 results) No results for input(s): "PROBNP" in the last 8760 hours. HbA1C: No results for input(s): "HGBA1C" in the last 72 hours. CBG: No results for input(s): "GLUCAP" in the last 168 hours. Lipid Profile: No results for input(s): "CHOL", "HDL", "LDLCALC", "TRIG", "CHOLHDL", "LDLDIRECT" in the last 72 hours. Thyroid Function Tests: Recent Labs    08/26/22 0523 08/26/22 0524  TSH  --  <  0.010*  FREET4 2.46*  --    Anemia Panel: No results for input(s): "VITAMINB12", "FOLATE", "FERRITIN", "TIBC", "IRON", "RETICCTPCT" in the last 72 hours. Urine analysis:    Component Value Date/Time   COLORURINE YELLOW 08/26/2022 0852   APPEARANCEUR HAZY (A) 08/26/2022 0852   LABSPEC 1.012 08/26/2022 0852   PHURINE 6.0 08/26/2022 0852   GLUCOSEU NEGATIVE 08/26/2022 0852   HGBUR NEGATIVE 08/26/2022 0852   BILIRUBINUR NEGATIVE 08/26/2022 0852   KETONESUR NEGATIVE 08/26/2022 0852   PROTEINUR 30 (A) 08/26/2022 0852   UROBILINOGEN 1.0 12/31/2008 1341   NITRITE NEGATIVE 08/26/2022 0852   LEUKOCYTESUR NEGATIVE 08/26/2022 0852    Radiological Exams on Admission: CT Head Wo Contrast  Result Date: 08/26/2022 CLINICAL DATA:  82 year old male status post fall. History of pituitary macro adenoma. EXAM: CT HEAD WITHOUT CONTRAST TECHNIQUE: Contiguous axial images were obtained from the base of the skull through the vertex without intravenous contrast. RADIATION DOSE REDUCTION: This exam was performed according to the departmental dose-optimization program which  includes automated exposure control, adjustment of the mA and/or kV according to patient size and/or use of iterative reconstruction technique. COMPARISON:  Brain MRI 06/04/2019.  Head CT 07/26/2021. FINDINGS: Brain: Cerebral volume is within normal limits for age. No midline shift or ventriculomegaly. 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 No superimposed acute intracranial hemorrhage. No cortically based acute infarct identified. Gray-white differentiation is stable and within normal limits for age. Vascular: Calcified atherosclerosis at the skull base. No suspicious intracranial vascular hyperdensity. Skull: No acute osseous abnormality identified. Sinuses/Orbits: Moderate bilateral paranasal sinus mucosal thickening now, most pronounced in the ethmoids. Some associated layering sinus fluid. Tympanic cavities and mastoids remain clear. Other: Chronic posterior convexity scalp soft tissue scarring. Visualized orbit soft tissues are within normal limits. IMPRESSION: 1. No acute traumatic injury identified. 2. Chronic pituitary mass is stable to minimally larger (by about 1 mm) since 2020. No acute intracranial abnormality. 3. Moderate paranasal sinus inflammation is new since 2022. Consider acute sinusitis in the appropriate clinical setting. Electronically Signed   By: Genevie Ann M.D.   On: 08/26/2022 07:09   DG Chest Port 1 View  Result Date: 08/26/2022 CLINICAL DATA:  82 year old male with history of shortness of breath. EXAM: PORTABLE CHEST 1 VIEW COMPARISON:  Chest x-ray 10/07/2018. FINDINGS: Lung volumes are normal. Opacity in the periphery of the left base which may reflect atelectasis and/or consolidation, potentially with superimposed small left pleural effusion. Right lung is clear. No right pleural effusion. No pneumothorax. No evidence of pulmonary edema. Heart size is borderline enlarged, accentuated by  portable AP technique and mild patient rotation to the left. Upper mediastinal contours are within normal limits. Old healed fracture of the posterolateral right eighth rib incidentally noted. IMPRESSION: 1. Area of atelectasis and/or consolidation in the periphery of the left lung base, potentially with small superimposed left pleural effusion. In the appropriate clinical setting, this could represent a focus of pneumonia, however, if there is history of chest pain, infarcts from pulmonary embolism should also be considered. If clinically appropriate, PE protocol CT scan could be considered. Electronically Signed   By: Vinnie Langton M.D.   On: 08/26/2022 06:01    [facility] For contact please use secure messenger on Epic  If 7PM-7AM, please contact night-coverage located on [email or web address]   08/26/2022, 12:02 PM

## 2022-08-26 NOTE — ED Notes (Signed)
Pt dropped down to 86-88% on RA. This RN placed pt back on 2L [place]. Will continue to monitor.

## 2022-08-27 DIAGNOSIS — I951 Orthostatic hypotension: Secondary | ICD-10-CM | POA: Diagnosis not present

## 2022-08-27 LAB — CBC
HCT: 37.9 % — ABNORMAL LOW (ref 39.0–52.0)
Hemoglobin: 12.1 g/dL — ABNORMAL LOW (ref 13.0–17.0)
MCH: 31.3 pg (ref 26.0–34.0)
MCHC: 31.9 g/dL (ref 30.0–36.0)
MCV: 98.2 fL (ref 80.0–100.0)
Platelets: 169 10*3/uL (ref 150–400)
RBC: 3.86 MIL/uL — ABNORMAL LOW (ref 4.22–5.81)
RDW: 13.4 % (ref 11.5–15.5)
WBC: 8.9 10*3/uL (ref 4.0–10.5)
nRBC: 0 % (ref 0.0–0.2)

## 2022-08-27 LAB — BASIC METABOLIC PANEL
Anion gap: 8 (ref 5–15)
BUN: 14 mg/dL (ref 8–23)
CO2: 23 mmol/L (ref 22–32)
Calcium: 7.9 mg/dL — ABNORMAL LOW (ref 8.9–10.3)
Chloride: 106 mmol/L (ref 98–111)
Creatinine, Ser: 1.05 mg/dL (ref 0.61–1.24)
GFR, Estimated: >60 mL/min (ref >60)
Glucose, Bld: 120 mg/dL — ABNORMAL HIGH (ref 70–99)
Potassium: 4.3 mmol/L (ref 3.5–5.1)
Sodium: 137 mmol/L (ref 135–145)

## 2022-08-27 MED ORDER — PREDNISONE 10 MG PO TABS
15.0000 mg | ORAL_TABLET | ORAL | Status: AC
  Administered 2022-08-27: 15 mg via ORAL
  Filled 2022-08-27: qty 2

## 2022-08-27 MED ORDER — PREDNISONE 20 MG PO TABS
20.0000 mg | ORAL_TABLET | Freq: Every day | ORAL | Status: DC
  Administered 2022-08-28: 20 mg via ORAL
  Filled 2022-08-27: qty 1

## 2022-08-27 MED ORDER — ORAL CARE MOUTH RINSE: 15.0000 mL | OROMUCOSAL | Status: DC | PRN

## 2022-08-27 MED ORDER — ONDANSETRON HCL 4 MG/2ML IJ SOLN
4.0000 mg | Freq: Four times a day (QID) | INTRAMUSCULAR | Status: DC | PRN
  Administered 2022-08-27: 4 mg via INTRAVENOUS
  Filled 2022-08-27: qty 2

## 2022-08-27 NOTE — Progress Notes (Signed)
PROGRESS NOTE    Julian Harris  FIE:[id number] DOB: 1940-10-08 DOA: 08/26/2022 PCP: Janie Morning, DO   Brief Narrative:  Julian Harris is a 82 y.o. male with medical history significant of adrenal insufficiency, hypertension with chronic pituitary macroadenoma.  Patient presents to the ED with apparently 2 to 3 days of worsening ambulatory status and falls.  He denies loss of consciousness nausea vomiting chest pain shortness of breath headache fevers or chills. In the ED patient was evaluated with negative imaging in the setting of recent falls and COVID-positive status. Hospitalist called for admission for COVID-positive status and falls. Patient subsequently noted to be obtunded in the ED in the setting of benzo administration and adrenal crisis.  Assessment & Plan:   Principal Problem:   Orthostatic hypotension Active Problems:   Essential hypertension   Adrenal insufficiency (HCC)   Pituitary macroadenoma (HCC)   COVID-19 virus infection   Acute metabolic encephalopathy  Acute adrenal crisis, improving -Patient indicates his recent dosing has changed to 20 mg once daily while sick (previously documented 5 mg twice daily dosing was inaccurate) -Endocrinology following the outpatient setting, patient has a somewhat sliding scale of his steroid use ranging between 10 and 20 mg depending on symptoms, blood pressure, as well as if he is acutely ill. -Likely triggered by acute infection as below with COVID as well as poor p.o. intake and dehydration. -Cortisol level within normal limits, TSH undetectably low, free T4 elevated at 1.66   Acute metabolic encephalopathy, multifactorial, resolved Polypharmacy Covid infection and encephalopathy, less likely -Patient now ANO x 4 back to baseline -Patient Aox4 at intake - subsequently obtunded, unable to answer questions appropriately after IV diazepam administration -Hold all CNS depressants at this time. Could be covid encephalopathy but  would not expect such a drastic change in this short amount of time. -CT head unremarkable -Adrenal insufficiency/hypotension likely playing a partial role -Cultures collected to rule out any underlying infection   Incidental COVID positive status, cannot rule out infection  -Patient denies any sick contacts but notes his wife is now ill at home and is COVID-positive -He is not hypoxic, has a mild cough and some nausea and abnormal taste but otherwise is asymptomatic -No indication for treatment at this time, 5-day quarantine to complete 08/29/2022  Ambulatory dysfunction Orthostatic hypotension Patient presented with ambulatory dysfunction and falls with positive orthostatics in the ED Continue IV fluid challenge and follow repeat orthostatic vitals PT/OT to follow   DVT prophylaxis: Lovenox Code Status: Full Family Communication: Wife updated over the phone  Status is: Inpatient  Dispo: The patient is from: Home              Anticipated d/c is to: Home              Anticipated d/c date is: 24 to 48 hours pending clinical course              Patient currently not medically stable for discharge  Consultants:  None  Procedures:  None  Antimicrobials:  None indicated  Subjective: No acute issues or events overnight, much more awake alert and oriented today, review of systems essentially unremarkable other than nausea and abnormal taste.  He denies shortness of breath chest pain headache fevers chills constipation diarrhea.  Objective: Vitals:   08/26/22 1800 08/26/22 2053 08/26/22 2304 08/27/22 0300  BP: 104/63  137/86 (!) 155/102  Pulse: 75  68 76  Resp: '16  20 20  '$ Temp:  98.8 F (  37.1 C) 98.6 F (37 C) 99.1 F (37.3 C)  TempSrc:  Oral Oral Oral  SpO2: 96%  90% 92%  Weight:      Height:        Intake/Output Summary (Last 24 hours) at 08/27/2022 0713 Last data filed at 08/26/2022 1800 Gross per 24 hour  Intake 818.99 ml  Output --  Net 818.99 ml   Filed  Weights   08/26/22 0504 08/26/22 1744  Weight: 97.5 kg 99.2 kg    Examination:  General:  Pleasantly resting in bed, No acute distress. HEENT:  Normocephalic atraumatic.  Sclerae nonicteric, noninjected.  Extraocular movements intact bilaterally. Neck:  Without mass or deformity.  Trachea is midline. Lungs:  Clear to auscultate bilaterally without rhonchi, wheeze, or rales. Heart:  Regular rate and rhythm.  Without murmurs, rubs, or gallops. Abdomen:  Soft, nontender, nondistended.  Without guarding or rebound. Extremities: Without cyanosis, clubbing, edema, or obvious deformity. Vascular:  Dorsalis pedis and posterior tibial pulses palpable bilaterally. Skin:  Warm and dry, no erythema, no ulcerations.  Data Reviewed: I have personally reviewed following labs and imaging studies  CBC: Recent Labs  Lab 08/26/22 0523 08/27/22 0015  WBC 9.8 8.9  NEUTROABS 7.3  --   HGB 13.5 12.1*  HCT 41.9 37.9*  MCV 98.8 98.2  PLT 181 341   Basic Metabolic Panel: Recent Labs  Lab 08/26/22 0523 08/27/22 0015  NA 136 137  K 3.5 4.3  CL 101 106  CO2 25 23  GLUCOSE 100* 120*  BUN 13 14  CREATININE 1.20 1.05  CALCIUM 8.1* 7.9*  MG 1.9  --    GFR: Estimated Creatinine Clearance: 64.1 mL/min (by C-G formula based on SCr of 1.05 mg/dL). Liver Function Tests: Recent Labs  Lab 08/26/22 0523  AST 29  ALT 37  ALKPHOS 38  BILITOT 1.4*  PROT 5.9*  ALBUMIN 3.2*   Thyroid Function Tests: Recent Labs    08/26/22 0523 08/26/22 0524  TSH  --  <0.010*  FREET4 2.46*  --     Recent Results (from the past 240 hour(s))  Resp panel by RT-PCR (RSV, Flu A&B, Covid) Anterior Nasal Swab     Status: Abnormal   Collection Time: 08/26/22  5:25 AM   Specimen: Anterior Nasal Swab  Result Value Ref Range Status   SARS Coronavirus 2 by RT PCR POSITIVE (A) NEGATIVE Final    Comment: (NOTE) SARS-CoV-2 target nucleic acids are DETECTED.  The SARS-CoV-2 RNA is generally detectable in upper  respiratory specimens during the acute phase of infection. Positive results are indicative of the presence of the identified virus, but do not rule out bacterial infection or co-infection with other pathogens not detected by the test. Clinical correlation with patient history and other diagnostic information is necessary to determine patient infection status. The expected result is Negative.  Fact Sheet for Patients: EntrepreneurPulse.com.au  Fact Sheet for Healthcare Providers: IncredibleEmployment.be  This test is not yet approved or cleared by the [place] FDA and  has been authorized for detection and/or diagnosis of SARS-CoV-2 by FDA under an Emergency Use Authorization (EUA).  This EUA will remain in effect (meaning this test can be used) for the duration of  the COVID-19 declaration under Section 564(b)(1) of the A ct, 21 U.S.C. section 360bbb-3(b)(1), unless the authorization is terminated or revoked sooner.     Influenza A by PCR NEGATIVE NEGATIVE Final   Influenza B by PCR NEGATIVE NEGATIVE Final    Comment: (NOTE) The Xpert  Xpress SARS-CoV-2/FLU/RSV plus assay is intended as an aid in the diagnosis of influenza from Nasopharyngeal swab specimens and should not be used as a sole basis for treatment. Nasal washings and aspirates are unacceptable for Xpert Xpress SARS-CoV-2/FLU/RSV testing.  Fact Sheet for Patients: EntrepreneurPulse.com.au  Fact Sheet for Healthcare Providers: IncredibleEmployment.be  This test is not yet approved or cleared by the [place] FDA and has been authorized for detection and/or diagnosis of SARS-CoV-2 by FDA under an Emergency Use Authorization (EUA). This EUA will remain in effect (meaning this test can be used) for the duration of the COVID-19 declaration under Section 564(b)(1) of the Act, 21 U.S.C. section 360bbb-3(b)(1), unless the authorization is  terminated or revoked.     Resp Syncytial Virus by PCR NEGATIVE NEGATIVE Final    Comment: (NOTE) Fact Sheet for Patients: EntrepreneurPulse.com.au  Fact Sheet for Healthcare Providers: IncredibleEmployment.be  This test is not yet approved or cleared by the [place] FDA and has been authorized for detection and/or diagnosis of SARS-CoV-2 by FDA under an Emergency Use Authorization (EUA). This EUA will remain in effect (meaning this test can be used) for the duration of the COVID-19 declaration under Section 564(b)(1) of the Act, 21 U.S.C. section 360bbb-3(b)(1), unless the authorization is terminated or revoked.  Performed at [facility], [place] [address] [place], [place] [address]   MRSA Next Gen by PCR, Nasal     Status: None   Collection Time: 08/26/22  5:55 PM   Specimen: Nasal Mucosa; Nasal Swab  Result Value Ref Range Status   MRSA by PCR Next Gen NOT DETECTED NOT DETECTED Final    Comment: (NOTE) The GeneXpert MRSA Assay (FDA approved for NASAL specimens only), is one component of a comprehensive MRSA colonization surveillance program. It is not intended to diagnose MRSA infection nor to guide or monitor treatment for MRSA infections. Test performance is not FDA approved in patients less than 58 years old. Performed at [facility], [place] [address], [place], [place] [address]          Radiology Studies: CT Head Wo Contrast  Result Date: 08/26/2022 CLINICAL DATA:  82 year old male status post fall. History of pituitary macro adenoma. EXAM: CT HEAD WITHOUT CONTRAST TECHNIQUE: Contiguous axial images were obtained from the base of the skull through the vertex without intravenous contrast. RADIATION DOSE REDUCTION: This exam was performed according to the departmental dose-optimization program which includes automated exposure control, adjustment of the mA and/or kV according to patient size and/or use of iterative  reconstruction technique. COMPARISON:  Brain MRI 06/04/2019.  Head CT 07/26/2021. FINDINGS: Brain: Cerebral volume is within normal limits for age. No midline shift or ventriculomegaly. 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 No superimposed acute intracranial hemorrhage. No cortically based acute infarct identified. Gray-white differentiation is stable and within normal limits for age. Vascular: Calcified atherosclerosis at the skull base. No suspicious intracranial vascular hyperdensity. Skull: No acute osseous abnormality identified. Sinuses/Orbits: Moderate bilateral paranasal sinus mucosal thickening now, most pronounced in the ethmoids. Some associated layering sinus fluid. Tympanic cavities and mastoids remain clear. Other: Chronic posterior convexity scalp soft tissue scarring. Visualized orbit soft tissues are within normal limits. IMPRESSION: 1. No acute traumatic injury identified. 2. Chronic pituitary mass is stable to minimally larger (by about 1 mm) since 2020. No acute intracranial abnormality. 3. Moderate paranasal sinus inflammation is new since 2022. Consider acute  sinusitis in the appropriate clinical setting. Electronically Signed   By: Genevie Ann M.D.   On: 08/26/2022 07:09   DG Chest Port 1 View  Result Date: 08/26/2022 CLINICAL DATA:  82 year old male with history of shortness of breath. EXAM: PORTABLE CHEST 1 VIEW COMPARISON:  Chest x-ray 10/07/2018. FINDINGS: Lung volumes are normal. Opacity in the periphery of the left base which may reflect atelectasis and/or consolidation, potentially with superimposed small left pleural effusion. Right lung is clear. No right pleural effusion. No pneumothorax. No evidence of pulmonary edema. Heart size is borderline enlarged, accentuated by portable AP technique and mild patient rotation to the left. Upper mediastinal contours are within normal limits. Old  healed fracture of the posterolateral right eighth rib incidentally noted. IMPRESSION: 1. Area of atelectasis and/or consolidation in the periphery of the left lung base, potentially with small superimposed left pleural effusion. In the appropriate clinical setting, this could represent a focus of pneumonia, however, if there is history of chest pain, infarcts from pulmonary embolism should also be considered. If clinically appropriate, PE protocol CT scan could be considered. Electronically Signed   By: Vinnie Langton M.D.   On: 08/26/2022 06:01    Scheduled Meds:  enoxaparin (LOVENOX) injection  40 mg Subcutaneous Q24H   finasteride  5 mg Oral Daily   levothyroxine  75 mcg Oral Q0600   predniSONE  5 mg Oral BID WC   rOPINIRole  1 mg Oral QHS   tamsulosin  0.4 mg Oral QPM    LOS: 1 day   Time spent: 74mn  Carletha Dawn C Ahana Najera, DO [facility]  If 7PM-7AM, please contact night-coverage [email or web address]  08/27/2022, 7:13 AM

## 2022-08-27 NOTE — Evaluation (Signed)
Physical Therapy Evaluation Patient Details Name: Julian Harris MRN: [id number] DOB: [birth year] Today's Date: 08/27/2022  History of Present Illness  Pt is a 82 y.o. M who presents 08/26/2022 with 2-3 days of worsening ambulatory status and falls.  In the ED patient was evaluated with negative imaging in the setting of recent falls and COVID-positive status. Significant PMH: adrenal insufficiency, HTN, and chronic pituitary macroadenoma.  Clinical Impression  PTA, pt lives with his significant other and is independent. Pt presents with decreased functional mobility secondary to + orthostatic hypotension and associated balance deficits. Pt ambulating 25 ft x 2 with no assistive device at a min guard assist level. Reports increased fatigue. Pt states he can normally anticipate when BP is low and adjusts medications accordingly. Will continue to progress as tolerated.  Orthostatic Vital Signs:  Supine: 111/81 (90) Sitting: 83/66 (74) Standing: 75/66 (71) Sitting post short bout of ambulation: 92/70 (77)     Recommendations for follow up therapy are one component of a multi-disciplinary discharge planning process, led by the attending physician.  Recommendations may be updated based on patient status, additional functional criteria and insurance authorization.  Follow Up Recommendations No PT follow up (pt declining)      Assistance Recommended at Discharge Intermittent Supervision/Assistance  Patient can return home with the following  Assistance with cooking/housework;Assist for transportation;Help with stairs or ramp for entrance    Equipment Recommendations None recommended by PT  Recommendations for Other Services       Functional Status Assessment Patient has had a recent decline in their functional status and demonstrates the ability to make significant improvements in function in a reasonable and predictable amount of time.     Precautions / Restrictions Precautions Precautions:  Fall;Other (comment) Precaution Comments: orthostatic Restrictions Weight Bearing Restrictions: No      Mobility  Bed Mobility Overal bed mobility: Independent                  Transfers Overall transfer level: Needs assistance Equipment used: None Transfers: Sit to/from Stand Sit to Stand: Supervision                Ambulation/Gait Ambulation/Gait assistance: Min guard Gait Distance (Feet): 50 Feet (25", 25") Assistive device: Rolling walker (2 wheels) Gait Pattern/deviations: Decreased stride length, Staggering right, Step-through pattern Gait velocity: decreased     General Gait Details: Pt with one episode of lateral LOB requiring min guard to correct, dynamic instability throughout  Stairs            Wheelchair Mobility    Modified Rankin (Stroke Patients Only)       Balance Overall balance assessment: Mild deficits observed, not formally tested                                           Pertinent Vitals/Pain Pain Assessment Pain Assessment: No/denies pain    Home Living Family/patient expects to be discharged to:: Private residence Living Arrangements: Spouse/significant other (significant other, [name])   Type of Home: House Home Access: Stairs to enter   [employer] of Steps: 5   Home Layout: Able to live on main level with bedroom/bathroom;Laundry or work area in [place]: [name] - single point      Prior Function Prior Level of Function : Independent/Modified Independent  Hand Dominance        Extremity/Trunk Assessment   Upper Extremity Assessment Upper Extremity Assessment: Overall WFL for tasks assessed    Lower Extremity Assessment Lower Extremity Assessment: Overall WFL for tasks assessed    Cervical / Trunk Assessment Cervical / Trunk Assessment: Normal  Communication   Communication: HOH  Cognition Arousal/Alertness:  Awake/alert Behavior During Therapy: WFL for tasks assessed/performed Overall Cognitive Status: Within Functional Limits for tasks assessed                                          General Comments      Exercises General Exercises - Lower Extremity Hip ABduction/ADduction: Both, 10 reps, Seated Hip Flexion/Marching: Both, 10 reps, Seated   Assessment/Plan    PT Assessment Patient needs continued PT services  PT Problem List Decreased activity tolerance;Decreased balance;Decreased mobility       PT Treatment Interventions Stair training;Gait training;Functional mobility training;Therapeutic activities;Therapeutic exercise;Balance training;Patient/family education    PT Goals (Current goals can be found in the Care Plan section)  Acute Rehab PT Goals Patient Stated Goal: to not be a burden on his significant other PT Goal Formulation: With patient Time For Goal Achievement: 09/10/22 Potential to Achieve Goals: Good    Frequency Min 3X/week     Co-evaluation               AM-PAC PT "6 Clicks" Mobility  Outcome Measure Help needed turning from your back to your side while in a flat bed without using bedrails?: None Help needed moving from lying on your back to sitting on the side of a flat bed without using bedrails?: None Help needed moving to and from a bed to a chair (including a wheelchair)?: A Little Help needed standing up from a chair using your arms (e.g., wheelchair or bedside chair)?: A Little Help needed to walk in hospital room?: A Little Help needed climbing 3-5 steps with a railing? : A Little 6 Click Score: 20    End of Session   Activity Tolerance: Other (comment) (limited by orthostatic hypotension) Patient left: in bed;with call bell/phone within reach Nurse Communication: Mobility status PT Visit Diagnosis: Unsteadiness on feet (R26.81);Dizziness and giddiness (R42)    Time: 0160-1093 PT Time Calculation (min) (ACUTE ONLY):  27 min   Charges:   PT Evaluation $PT Eval Low Complexity: 1 Low PT Treatments $Therapeutic Activity: 8-22 mins        Wyona Almas, PT, DPT Acute Rehabilitation Services Office [phone]   Deno Etienne 08/27/2022, 2:03 PM

## 2022-08-27 NOTE — Progress Notes (Signed)
TRH night cross cover note:   I was notified by RN that the patient is experiencing some nausea, and does not currently have order for prn antiemetic.  I subsequently placed order for as needed IV Zofran and communicated this plan to the patient's RN.     Babs Bertin, DO Hospitalist

## 2022-08-28 ENCOUNTER — Other Ambulatory Visit: Payer: Self-pay | Admitting: Internal Medicine

## 2022-08-28 DIAGNOSIS — N3000 Acute cystitis without hematuria: Secondary | ICD-10-CM

## 2022-08-28 DIAGNOSIS — I951 Orthostatic hypotension: Secondary | ICD-10-CM | POA: Diagnosis not present

## 2022-08-28 DIAGNOSIS — N390 Urinary tract infection, site not specified: Secondary | ICD-10-CM | POA: Insufficient documentation

## 2022-08-28 LAB — BASIC METABOLIC PANEL
Anion gap: 8 (ref 5–15)
BUN: 12 mg/dL (ref 8–23)
CO2: 25 mmol/L (ref 22–32)
Calcium: 8 mg/dL — ABNORMAL LOW (ref 8.9–10.3)
Chloride: 102 mmol/L (ref 98–111)
Creatinine, Ser: 1 mg/dL (ref 0.61–1.24)
GFR, Estimated: >60 mL/min (ref >60)
Glucose, Bld: 139 mg/dL — ABNORMAL HIGH (ref 70–99)
Potassium: 3.9 mmol/L (ref 3.5–5.1)
Sodium: 135 mmol/L (ref 135–145)

## 2022-08-28 LAB — CBC
HCT: 36.1 % — ABNORMAL LOW (ref 39.0–52.0)
Hemoglobin: 11.9 g/dL — ABNORMAL LOW (ref 13.0–17.0)
MCH: 31.8 pg (ref 26.0–34.0)
MCHC: 33 g/dL (ref 30.0–36.0)
MCV: 96.5 fL (ref 80.0–100.0)
Platelets: 185 10*3/uL (ref 150–400)
RBC: 3.74 MIL/uL — ABNORMAL LOW (ref 4.22–5.81)
RDW: 13.1 % (ref 11.5–15.5)
WBC: 5.8 10*3/uL (ref 4.0–10.5)
nRBC: 0 % (ref 0.0–0.2)

## 2022-08-28 LAB — URINE CULTURE: Culture: >=100000 — AB

## 2022-08-28 MED ORDER — LEVOTHYROXINE SODIUM 75 MCG PO TABS: 75.0000 ug | ORAL_TABLET | Freq: Every day | ORAL | 2 refills | Status: AC

## 2022-08-28 MED ORDER — SULFAMETHOXAZOLE-TRIMETHOPRIM 800-160 MG PO TABS: 1.0000 | ORAL_TABLET | Freq: Two times a day (BID) | ORAL | 0 refills | Status: DC

## 2022-08-28 MED ORDER — PREDNISONE 5 MG PO TABS: 20.0000 mg | ORAL_TABLET | Freq: Every day | ORAL | 0 refills | Status: AC

## 2022-08-28 NOTE — TOC Transition Note (Signed)
Transition of Care [facility]) - CM/SW Discharge Note   Patient Details  Name: Julian Harris MRN: [id number] Date of Birth: 08-27-1940  [facility]) CM/SW Contact:  Cyndi Bender, RN Phone Number: 08/28/2022, 11:16 AM   Clinical Narrative:     Patient stable for discharge.  No TOC needs at this time.  Final next level of care: Home/Self Care Barriers to Discharge: Barriers Resolved   Patient Goals and CMS Choice    Return home  Discharge Placement               home          Discharge Plan and Services Additional resources added to the After Visit Summary for                                       Social Determinants of Health (SDOH) Interventions SDOH Screenings   Tobacco Use: Medium Risk (08/26/2022)     Readmission Risk Interventions    08/28/2022   11:15 AM  Readmission Risk Prevention Plan  Post Dischage Appt Complete  Medication Screening Complete  Transportation Screening Complete

## 2022-08-28 NOTE — Progress Notes (Signed)
Patient discharged this morning, subsequent urine culture resulted with 100,000 colonies of Staph epidermidis, pansensitive, recommend 6 doses/3 days of Bactrim.  This will be called into his pharmacy to pick up at his convenience.  Holli Humbles DO

## 2022-08-28 NOTE — Progress Notes (Signed)
Mobility Specialist Progress Note   08/28/22 1018  Orthostatic Lying   BP- Lying (!) 142/100  Orthostatic Sitting  BP- Sitting (!) 144/106  Mobility  Activity Ambulated with assistance in hallway;Dangled on edge of bed  Level of Assistance Standby assist, set-up cues, supervision of patient - no hands on  Assistive Device None  Distance Ambulated (ft) 300 ft  Range of Motion/Exercises Active;All extremities  Activity Response Tolerated well   Pre Ambulation: BP 142/100,  SpO2 99% RA During Ambulation: SpO2 98% RA Post Ambulation: SpO2 99% RA  Patient received in supine and agreeable to participate. Ambulated supervision level with slow steady gait. Oxygen saturation maintained >98% on RA throughout. Was slightly dyspneic upon returning to room and recovered quickly with seated rest. Tolerated without complaint or incident. Was left in dangling EOB with all needs met, call bell in reach.   [place] Hoy Fallert, BS EXP Mobility Specialist Please contact via SecureChat or Rehab office at [phone]

## 2022-08-28 NOTE — Discharge Summary (Signed)
Physician Discharge Summary  Julian Harris AOZ:[id number] DOB: 10-09-1940 DOA: 08/26/2022  PCP: Janie Morning, DO  Admit date: 08/26/2022 Discharge date: 08/28/2022  Admitted From: Home Disposition: Home  Recommendations for Outpatient Follow-up:  Follow up with PCP in 1-2 weeks Follow-up with endocrinology as discussed given change in Synthroid dosing  Discharge Condition: Stable CODE STATUS: Full Diet recommendation: Regular diet as tolerated  Brief/Interim Summary: Julian Harris is a 82 y.o. male with medical history significant of adrenal insufficiency, Julian with chronic pituitary macroadenoma.  Patient presents to the ED with apparently 2 to 3 days of worsening ambulatory status and falls.  He denies loss of consciousness nausea vomiting chest pain shortness of breath headache fevers or chills. In the ED patient was evaluated with negative imaging in the setting of recent falls and COVID-positive status. Hospitalist called for admission for COVID-positive status and falls. Patient subsequently noted to be obtunded in the ED in the setting of benzo administration and adrenal crisis.   Patient admitted as above in the setting of recent COVID infection, adrenal crisis and polypharmacy.  Patient's mental status improved drastically, he remained somewhat lethargic and dyspneic over the past 24 hours but has continued to improve with increasing activity, no longer requiring oxygen with ambulation.  Patient's medication changes as outlined below including increased steroids from baseline and decrease levothyroxine in the setting of undetectable TSH with elevated free T4.  Request patient see endocrinology as soon as possible to discuss this dosing decrease in the setting of adrenal crisis as they may want to continue an elevated thyroid level per their expertise.  He is experiencing some palpitations on occasion which may be secondary to elevated T4 and levothyroxine dose.  Patient otherwise  appears to be back to baseline denies nausea vomiting diarrhea constipation headache fevers chills or chest pain and is requesting discharge home which is certainly reasonable.   **Addendum -urine culture reported positive for Staph epidermidis, 3 days of Bactrim called in for patient for UTI, POA given urine culture collected at intake.  Interestingly patient did not have any symptoms or signs of urine infection other than maybe increased frequency in urination but was initially thought due to increased IV fluids.  Discharge Diagnoses:  Principal Problem:   Orthostatic hypotension Active Problems:   Essential Julian   Adrenal insufficiency (HCC)   Pituitary macroadenoma (HCC)   COVID-19 virus infection   Acute metabolic encephalopathy   Acute adrenal crisis, improving -Patient indicates his recent dosing has changed to 20 mg once daily while sick (previously documented 5 mg twice daily dosing was inaccurate) -Endocrinology following the outpatient setting, continue steroid management per their expertise.  Would recommend at least 3-4 more days of 20 mg prednisone given acute infection as above.  This could certainly be tapered down quickly if patient's clinical status improves. -Likely triggered by acute infection as below with COVID as well as poor p.o. intake and dehydration. -Cortisol level within normal limits, TSH undetectably low, free T4 elevated at 2.46 -decrease levothyroxine dosing.   Acute metabolic encephalopathy, multifactorial, resolved Polypharmacy Cannot rule out UTI, POA Covid infection and encephalopathy, less likely -Patient now ANO x 4 back to baseline -Patient Aox4 at intake - subsequently obtunded, unable to answer questions appropriately after IV diazepam administration -Urine culture positive for Staph epidermidis 100k colonies but patient without symptoms and improving with supportive care as above.  Will call in 3 days of antibiotics for patient at  discharge in case symptoms arise. -Hold all CNS  depressants at this time. Could be covid encephalopathy but would not expect such a drastic change in this short amount of time, both somnolence and improvement. -CT head unremarkable -Adrenal insufficiency/hypotension likely playing a partial role -Blood cultures collected to rule out any underlying infection, remain negative thus far   Incidental COVID positive status, cannot rule out infection  -Patient denies any sick contacts but notes his wife is now ill at home and is COVID-positive -He is not hypoxic, has a mild cough and some nausea and abnormal taste but otherwise is asymptomatic -No indication for treatment at this time, 5-day quarantine to complete 08/29/2022   Ambulatory dysfunction Orthostatic hypotension Patient presented with ambulatory dysfunction and falls with positive orthostatics in the ED Orthostatics improved with IV fluid and increased p.o. intake, no longer symptomatic No outpatient PT follow-up required  Discharge Instructions  Discharge Instructions     Call MD for:  difficulty breathing, headache or visual disturbances   Complete by: As directed    Call MD for:  extreme fatigue   Complete by: As directed    Call MD for:  persistant dizziness or light-headedness   Complete by: As directed    Call MD for:  persistant nausea and vomiting   Complete by: As directed    Call MD for:  severe uncontrolled pain   Complete by: As directed    Call MD for:  temperature >100.4   Complete by: As directed    Diet - low sodium heart healthy   Complete by: As directed    Discharge instructions   Complete by: As directed    Follow up with PCP in the next week to two weeks Call endocrinology as soon as available to discuss medication change to synthroid.   Increase activity slowly   Complete by: As directed       Allergies as of 08/28/2022       Reactions   Trazodone    Other reaction(s): too sedating         Medication List     TAKE these medications    finasteride 5 MG tablet Commonly known as: PROSCAR Take 1 tablet by mouth daily.   levothyroxine 75 MCG tablet Commonly known as: SYNTHROID Take 1 tablet (75 mcg total) by mouth daily at 6 (six) AM. Start taking on: August 29, 2022 What changed:  when to take this Another medication with the same name was removed. Continue taking this medication, and follow the directions you see here.   predniSONE 5 MG tablet Commonly known as: DELTASONE Take 4 tablets (20 mg total) by mouth daily with breakfast for 14 days. Start taking on: August 29, 2022 What changed:  how much to take when to take this   rOPINIRole 1 MG tablet Commonly known as: REQUIP Take 1 mg by mouth at bedtime. Taking 1-3 hrs before bedtime.   tamsulosin 0.4 MG Caps capsule Commonly known as: FLOMAX Take 1 capsule by mouth every evening.        Allergies  Allergen Reactions   Trazodone     Other reaction(s): too sedating    Consultations: None  Procedures/Studies: CT Head Wo Contrast  Result Date: 08/26/2022 CLINICAL DATA:  82 year old male status post fall. History of pituitary macro adenoma. EXAM: CT HEAD WITHOUT CONTRAST TECHNIQUE: Contiguous axial images were obtained from the base of the skull through the vertex without intravenous contrast. RADIATION DOSE REDUCTION: This exam was performed according to the departmental dose-optimization program which includes automated exposure control,  adjustment of the mA and/or kV according to patient size and/or use of iterative reconstruction technique. COMPARISON:  Brain MRI 06/04/2019.  Head CT 07/26/2021. FINDINGS: Brain: Cerebral volume is within normal limits for age. No midline shift or ventriculomegaly. 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 No superimposed acute intracranial hemorrhage. No cortically based acute  infarct identified. Gray-white differentiation is stable and within normal limits for age. Vascular: Calcified atherosclerosis at the skull base. No suspicious intracranial vascular hyperdensity. Skull: No acute osseous abnormality identified. Sinuses/Orbits: Moderate bilateral paranasal sinus mucosal thickening now, most pronounced in the ethmoids. Some associated layering sinus fluid. Tympanic cavities and mastoids remain clear. Other: Chronic posterior convexity scalp soft tissue scarring. Visualized orbit soft tissues are within normal limits. IMPRESSION: 1. No acute traumatic injury identified. 2. Chronic pituitary mass is stable to minimally larger (by about 1 mm) since 2020. No acute intracranial abnormality. 3. Moderate paranasal sinus inflammation is new since 2022. Consider acute sinusitis in the appropriate clinical setting. Electronically Signed   By: Genevie Ann M.D.   On: 08/26/2022 07:09   DG Chest Port 1 View  Result Date: 08/26/2022 CLINICAL DATA:  82 year old male with history of shortness of breath. EXAM: PORTABLE CHEST 1 VIEW COMPARISON:  Chest x-ray 10/07/2018. FINDINGS: Lung volumes are normal. Opacity in the periphery of the left base which may reflect atelectasis and/or consolidation, potentially with superimposed small left pleural effusion. Right lung is clear. No right pleural effusion. No pneumothorax. No evidence of pulmonary edema. Heart size is borderline enlarged, accentuated by portable AP technique and mild patient rotation to the left. Upper mediastinal contours are within normal limits. Old healed fracture of the posterolateral right eighth rib incidentally noted. IMPRESSION: 1. Area of atelectasis and/or consolidation in the periphery of the left lung base, potentially with small superimposed left pleural effusion. In the appropriate clinical setting, this could represent a focus of pneumonia, however, if there is history of chest pain, infarcts from pulmonary embolism should  also be considered. If clinically appropriate, PE protocol CT scan could be considered. Electronically Signed   By: Vinnie Langton M.D.   On: 08/26/2022 06:01     Subjective: No acute issues or events overnight   Discharge Exam: Vitals:   08/28/22 0817 08/28/22 0818  BP: (!) 133/94   Pulse: 72   Resp:    Temp: 97.6 F (36.4 C)   SpO2: 97% 93%   Vitals:   08/28/22 0321 08/28/22 0630 08/28/22 0817 08/28/22 0818  BP: 122/86  (!) 133/94   Pulse: 69  72   Resp: 20     Temp: 98.1 F (36.7 C)  97.6 F (36.4 C)   TempSrc: Oral  Oral   SpO2: 93%  97% 93%  Weight:  97.8 kg    Height:        General: Pt is alert, awake, not in acute distress Cardiovascular: RRR, S1/S2 +, no rubs, no gallops Respiratory: CTA bilaterally, no wheezing, no rhonchi Abdominal: Soft, NT, ND, bowel sounds + Extremities: no edema, no cyanosis    The results of significant diagnostics from this hospitalization (including imaging, microbiology, ancillary and laboratory) are listed below for reference.     Microbiology: Recent Results (from the past 240 hour(s))  Resp panel by RT-PCR (RSV, Flu A&B, Covid) Anterior Nasal Swab     Status: Abnormal   Collection Time: 08/26/22  5:25 AM   Specimen: Anterior  Nasal Swab  Result Value Ref Range Status   SARS Coronavirus 2 by RT PCR POSITIVE (A) NEGATIVE Final    Comment: (NOTE) SARS-CoV-2 target nucleic acids are DETECTED.  The SARS-CoV-2 RNA is generally detectable in upper respiratory specimens during the acute phase of infection. Positive results are indicative of the presence of the identified virus, but do not rule out bacterial infection or co-infection with other pathogens not detected by the test. Clinical correlation with patient history and other diagnostic information is necessary to determine patient infection status. The expected result is Negative.  Fact Sheet for Patients: EntrepreneurPulse.com.au  Fact Sheet for  Healthcare Providers: IncredibleEmployment.be  This test is not yet approved or cleared by the [place] FDA and  has been authorized for detection and/or diagnosis of SARS-CoV-2 by FDA under an Emergency Use Authorization (EUA).  This EUA will remain in effect (meaning this test can be used) for the duration of  the COVID-19 declaration under Section 564(b)(1) of the A ct, 21 U.S.C. section 360bbb-3(b)(1), unless the authorization is terminated or revoked sooner.     Influenza A by PCR NEGATIVE NEGATIVE Final   Influenza B by PCR NEGATIVE NEGATIVE Final    Comment: (NOTE) The Xpert Xpress SARS-CoV-2/FLU/RSV plus assay is intended as an aid in the diagnosis of influenza from Nasopharyngeal swab specimens and should not be used as a sole basis for treatment. Nasal washings and aspirates are unacceptable for Xpert Xpress SARS-CoV-2/FLU/RSV testing.  Fact Sheet for Patients: EntrepreneurPulse.com.au  Fact Sheet for Healthcare Providers: IncredibleEmployment.be  This test is not yet approved or cleared by the [place] FDA and has been authorized for detection and/or diagnosis of SARS-CoV-2 by FDA under an Emergency Use Authorization (EUA). This EUA will remain in effect (meaning this test can be used) for the duration of the COVID-19 declaration under Section 564(b)(1) of the Act, 21 U.S.C. section 360bbb-3(b)(1), unless the authorization is terminated or revoked.     Resp Syncytial Virus by PCR NEGATIVE NEGATIVE Final    Comment: (NOTE) Fact Sheet for Patients: EntrepreneurPulse.com.au  Fact Sheet for Healthcare Providers: IncredibleEmployment.be  This test is not yet approved or cleared by the [place] FDA and has been authorized for detection and/or diagnosis of SARS-CoV-2 by FDA under an Emergency Use Authorization (EUA). This EUA will remain in effect (meaning this  test can be used) for the duration of the COVID-19 declaration under Section 564(b)(1) of the Act, 21 U.S.C. section 360bbb-3(b)(1), unless the authorization is terminated or revoked.  Performed at [facility], [place] [address] [place], [place] [address]   Urine Culture (for pregnant, neutropenic or urologic patients or patients with an indwelling urinary catheter)     Status: Abnormal   Collection Time: 08/26/22 11:56 AM   Specimen: Urine, Clean Catch  Result Value Ref Range Status   Specimen Description URINE, CLEAN CATCH  Final   Special Requests   Final    NONE Performed at [facility], [place] [address], [place], [place] [address]    Culture >=100,000 COLONIES/mL STAPHYLOCOCCUS EPIDERMIDIS (A)  Final   Report Status 08/28/2022 FINAL  Final   Organism ID, Bacteria STAPHYLOCOCCUS EPIDERMIDIS (A)  Final      Susceptibility   Staphylococcus epidermidis - MIC*    CIPROFLOXACIN <=0.5 SENSITIVE Sensitive     GENTAMICIN <=0.5 SENSITIVE Sensitive     NITROFURANTOIN <=16 SENSITIVE Sensitive     OXACILLIN <=0.25 SENSITIVE Sensitive     TETRACYCLINE <=1 SENSITIVE Sensitive  VANCOMYCIN 2 SENSITIVE Sensitive     TRIMETH/SULFA <=10 SENSITIVE Sensitive     CLINDAMYCIN <=0.25 SENSITIVE Sensitive     RIFAMPIN <=0.5 SENSITIVE Sensitive     Inducible Clindamycin NEGATIVE Sensitive     * >=100,000 COLONIES/mL STAPHYLOCOCCUS EPIDERMIDIS  Culture, blood (Routine X 2) w Reflex to ID Panel     Status: None (Preliminary result)   Collection Time: 08/26/22  2:50 PM   Specimen: BLOOD RIGHT WRIST  Result Value Ref Range Status   Specimen Description BLOOD RIGHT WRIST  Final   Special Requests   Final    BOTTLES DRAWN AEROBIC AND ANAEROBIC Blood Culture adequate volume   Culture   Final    NO GROWTH 2 DAYS Performed at [facility], [place] [address], [place], [place] [address]    Report Status PENDING  Incomplete  Culture, blood (Routine X 2) w Reflex to ID Panel     Status: None  (Preliminary result)   Collection Time: 08/26/22  3:00 PM   Specimen: BLOOD LEFT WRIST  Result Value Ref Range Status   Specimen Description BLOOD LEFT WRIST  Final   Special Requests   Final    BOTTLES DRAWN AEROBIC AND ANAEROBIC Blood Culture adequate volume   Culture   Final    NO GROWTH 2 DAYS Performed at [facility], [place] [address] [place], [place] [address]    Report Status PENDING  Incomplete  MRSA Next Gen by PCR, Nasal     Status: None   Collection Time: 08/26/22  5:55 PM   Specimen: Nasal Mucosa; Nasal Swab  Result Value Ref Range Status   MRSA by PCR Next Gen NOT DETECTED NOT DETECTED Final    Comment: (NOTE) The GeneXpert MRSA Assay (FDA approved for NASAL specimens only), is one component of a comprehensive MRSA colonization surveillance program. It is not intended to diagnose MRSA infection nor to guide or monitor treatment for MRSA infections. Test performance is not FDA approved in patients less than 42 years old. Performed at [facility], [place] [address], [place], [place] [address]      Labs: BNP (last 3 results) Recent Labs    08/26/22 0542  BNP 944.9*   Basic Metabolic Panel: Recent Labs  Lab 08/26/22 0523 08/27/22 0015 08/28/22 0017  NA 136 137 135  K 3.5 4.3 3.9  CL 101 106 102  CO2 '25 23 25  '$ GLUCOSE 100* 120* 139*  BUN '13 14 12  '$ CREATININE 1.20 1.05 1.00  CALCIUM 8.1* 7.9* 8.0*  MG 1.9  --   --    Liver Function Tests: Recent Labs  Lab 08/26/22 0523  AST 29  ALT 37  ALKPHOS 38  BILITOT 1.4*  PROT 5.9*  ALBUMIN 3.2*   No results for input(s): "LIPASE", "AMYLASE" in the last 168 hours. No results for input(s): "AMMONIA" in the last 168 hours. CBC: Recent Labs  Lab 08/26/22 0523 08/27/22 0015 08/28/22 0017  WBC 9.8 8.9 5.8  NEUTROABS 7.3  --   --   HGB 13.5 12.1* 11.9*  HCT 41.9 37.9* 36.1*  MCV 98.8 98.2 96.5  PLT 181 169 185   Cardiac Enzymes: No results for input(s): "CKTOTAL", "CKMB", "CKMBINDEX",  "TROPONINI" in the last 168 hours. BNP: Invalid input(s): "POCBNP" CBG: No results for input(s): "GLUCAP" in the last 168 hours. D-Dimer No results for input(s): "DDIMER" in the last 72 hours. Hgb A1c No results for input(s): "HGBA1C" in the last 72 hours. Lipid  Profile No results for input(s): "CHOL", "HDL", "LDLCALC", "TRIG", "CHOLHDL", "LDLDIRECT" in the last 72 hours. Thyroid function studies Recent Labs    08/26/22 0524  TSH <0.010*   Anemia work up No results for input(s): "VITAMINB12", "FOLATE", "FERRITIN", "TIBC", "IRON", "RETICCTPCT" in the last 72 hours. Urinalysis    Component Value Date/Time   COLORURINE YELLOW 08/26/2022 0852   APPEARANCEUR HAZY (A) 08/26/2022 0852   LABSPEC 1.012 08/26/2022 0852   PHURINE 6.0 08/26/2022 0852   GLUCOSEU NEGATIVE 08/26/2022 0852   HGBUR NEGATIVE 08/26/2022 0852   BILIRUBINUR NEGATIVE 08/26/2022 0852   KETONESUR NEGATIVE 08/26/2022 0852   PROTEINUR 30 (A) 08/26/2022 0852   UROBILINOGEN 1.0 12/31/2008 1341   NITRITE NEGATIVE 08/26/2022 0852   LEUKOCYTESUR NEGATIVE 08/26/2022 0852   Sepsis Labs Recent Labs  Lab 08/26/22 0523 08/27/22 0015 08/28/22 0017  WBC 9.8 8.9 5.8   Microbiology Recent Results (from the past 240 hour(s))  Resp panel by RT-PCR (RSV, Flu A&B, Covid) Anterior Nasal Swab     Status: Abnormal   Collection Time: 08/26/22  5:25 AM   Specimen: Anterior Nasal Swab  Result Value Ref Range Status   SARS Coronavirus 2 by RT PCR POSITIVE (A) NEGATIVE Final    Comment: (NOTE) SARS-CoV-2 target nucleic acids are DETECTED.  The SARS-CoV-2 RNA is generally detectable in upper respiratory specimens during the acute phase of infection. Positive results are indicative of the presence of the identified virus, but do not rule out bacterial infection or co-infection with other pathogens not detected by the test. Clinical correlation with patient history and other diagnostic information is necessary to determine  patient infection status. The expected result is Negative.  Fact Sheet for Patients: EntrepreneurPulse.com.au  Fact Sheet for Healthcare Providers: IncredibleEmployment.be  This test is not yet approved or cleared by the [place] FDA and  has been authorized for detection and/or diagnosis of SARS-CoV-2 by FDA under an Emergency Use Authorization (EUA).  This EUA will remain in effect (meaning this test can be used) for the duration of  the COVID-19 declaration under Section 564(b)(1) of the A ct, 21 U.S.C. section 360bbb-3(b)(1), unless the authorization is terminated or revoked sooner.     Influenza A by PCR NEGATIVE NEGATIVE Final   Influenza B by PCR NEGATIVE NEGATIVE Final    Comment: (NOTE) The Xpert Xpress SARS-CoV-2/FLU/RSV plus assay is intended as an aid in the diagnosis of influenza from Nasopharyngeal swab specimens and should not be used as a sole basis for treatment. Nasal washings and aspirates are unacceptable for Xpert Xpress SARS-CoV-2/FLU/RSV testing.  Fact Sheet for Patients: EntrepreneurPulse.com.au  Fact Sheet for Healthcare Providers: IncredibleEmployment.be  This test is not yet approved or cleared by the [place] FDA and has been authorized for detection and/or diagnosis of SARS-CoV-2 by FDA under an Emergency Use Authorization (EUA). This EUA will remain in effect (meaning this test can be used) for the duration of the COVID-19 declaration under Section 564(b)(1) of the Act, 21 U.S.C. section 360bbb-3(b)(1), unless the authorization is terminated or revoked.     Resp Syncytial Virus by PCR NEGATIVE NEGATIVE Final    Comment: (NOTE) Fact Sheet for Patients: EntrepreneurPulse.com.au  Fact Sheet for Healthcare Providers: IncredibleEmployment.be  This test is not yet approved or cleared by the [place] FDA and has been  authorized for detection and/or diagnosis of SARS-CoV-2 by FDA under an Emergency Use Authorization (EUA). This EUA will remain in effect (meaning this test can be used) for the duration of the  COVID-19 declaration under Section 564(b)(1) of the Act, 21 U.S.C. section 360bbb-3(b)(1), unless the authorization is terminated or revoked.  Performed at [facility], [place] [address] [place], [place] [address]   Urine Culture (for pregnant, neutropenic or urologic patients or patients with an indwelling urinary catheter)     Status: Abnormal   Collection Time: 08/26/22 11:56 AM   Specimen: Urine, Clean Catch  Result Value Ref Range Status   Specimen Description URINE, CLEAN CATCH  Final   Special Requests   Final    NONE Performed at [facility], [place] [address] [place], [place] [address]    Culture >=100,000 COLONIES/mL STAPHYLOCOCCUS EPIDERMIDIS (A)  Final   Report Status 08/28/2022 FINAL  Final   Organism ID, Bacteria STAPHYLOCOCCUS EPIDERMIDIS (A)  Final      Susceptibility   Staphylococcus epidermidis - MIC*    CIPROFLOXACIN <=0.5 SENSITIVE Sensitive     GENTAMICIN <=0.5 SENSITIVE Sensitive     NITROFURANTOIN <=16 SENSITIVE Sensitive     OXACILLIN <=0.25 SENSITIVE Sensitive     TETRACYCLINE <=1 SENSITIVE Sensitive     VANCOMYCIN 2 SENSITIVE Sensitive     TRIMETH/SULFA <=10 SENSITIVE Sensitive     CLINDAMYCIN <=0.25 SENSITIVE Sensitive     RIFAMPIN <=0.5 SENSITIVE Sensitive     Inducible Clindamycin NEGATIVE Sensitive     * >=100,000 COLONIES/mL STAPHYLOCOCCUS EPIDERMIDIS  Culture, blood (Routine X 2) w Reflex to ID Panel     Status: None (Preliminary result)   Collection Time: 08/26/22  2:50 PM   Specimen: BLOOD RIGHT WRIST  Result Value Ref Range Status   Specimen Description BLOOD RIGHT WRIST  Final   Special Requests   Final    BOTTLES DRAWN AEROBIC AND ANAEROBIC Blood Culture adequate volume   Culture   Final    NO GROWTH 2 DAYS Performed at [facility], [address], [place], [place] [address]    Report Status PENDING  Incomplete  Culture, blood (Routine X 2) w Reflex to ID Panel     Status: None (Preliminary result)   Collection Time: 08/26/22  3:00 PM   Specimen: BLOOD LEFT WRIST  Result Value Ref Range Status   Specimen Description BLOOD LEFT WRIST  Final   Special Requests   Final    BOTTLES DRAWN AEROBIC AND ANAEROBIC Blood Culture adequate volume   Culture   Final    NO GROWTH 2 DAYS Performed at [facility], [place] [address], [place], [place] [address]    Report Status PENDING  Incomplete  MRSA Next Gen by PCR, Nasal     Status: None   Collection Time: 08/26/22  5:55 PM   Specimen: Nasal Mucosa; Nasal Swab  Result Value Ref Range Status   MRSA by PCR Next Gen NOT DETECTED NOT DETECTED Final    Comment: (NOTE) The GeneXpert MRSA Assay (FDA approved for NASAL specimens only), is one component of a comprehensive MRSA colonization surveillance program. It is not intended to diagnose MRSA infection nor to guide or monitor treatment for MRSA infections. Test performance is not FDA approved in patients less than 30 years old. Performed at [facility], [place] [address] [address] [place], [place] [address]      Time coordinating discharge: Over 30 minutes  SIGNED:   Little Ishikawa, DO [facility] 08/28/2022, 11:02 AM Pager   If 7PM-7AM, please contact night-coverage [email or web address]

## 2022-08-28 NOTE — TOC Initial Note (Signed)
Transition of Care [facility]) - Initial/Assessment Note    Patient Details  Name: Julian Harris MRN: [id number] Date of Birth: [birth year]  Transition of Care [facility]) CM/SW Contact:    Cyndi Bender, RN Phone Number: 08/28/2022, 8:36 AM  Clinical Narrative:                  Patient admitted for Covid and falls. Patient lives with significant other.  Physical therapy evaluated patient and patient is declining PT follow up. Patient's PCP is Janie Morning.  TOC following for needs.  Expected Discharge Plan: Home/Self Care Barriers to Discharge: Continued Medical Work up   Patient Goals and CMS Choice Patient states their goals for this hospitalization and ongoing recovery are:: return home          Expected Discharge Plan and Services       Living arrangements for the past 2 months: Single Family Home                                      Prior Living Arrangements/Services Living arrangements for the past 2 months: Single Family Home Lives with:: Domestic Partner Patient language and need for interpreter reviewed:: Yes        Need for Family Participation in Patient Care: Yes (Comment) Care giver support system in place?: Yes (comment)   Criminal Activity/Legal Involvement Pertinent to Current Situation/Hospitalization: No - Comment as needed  Activities of Daily Living      Permission Sought/Granted                  Emotional Assessment         Alcohol / Substance Use: Not Applicable Psych Involvement: No (comment)  Admission diagnosis:  General weakness [R53.1] H/O adrenal insufficiency [Z86.39] COVID-19 virus infection [U07.1] Patient Active Problem List   Diagnosis Date Noted   COVID-19 virus infection 48/27/0786   Acute metabolic encephalopathy 75/44/9201   Generalized abdominal pain 07/05/2021   Bloating 07/05/2021   Adrenal insufficiency ([place]) 04/23/2020   Orthostatic hypotension 04/23/2020   Pituitary macroadenoma ([place])  04/23/2020   Exertional dyspnea 04/23/2020   Grief reaction with prolonged bereavement    Syncope 10/07/2018   Essential hypertension    Epidermal cyst of neck 11/25/2013   PCP:  Janie Morning, DO Pharmacy:   [facility] [place] [address] Phone: [phone] Fax: [phone]     Social Determinants of Health (SDOH) Social History: SDOH Screenings   Tobacco Use: Medium Risk (08/26/2022)   SDOH Interventions:     Readmission Risk Interventions     No data to display

## 2022-08-31 LAB — CULTURE, BLOOD (ROUTINE X 2)
Culture: NO GROWTH
Culture: NO GROWTH
Special Requests: ADEQUATE
Special Requests: ADEQUATE

## 2022-09-06 DIAGNOSIS — E038 Other specified hypothyroidism: Secondary | ICD-10-CM | POA: Diagnosis not present

## 2022-09-06 DIAGNOSIS — E237 Disorder of pituitary gland, unspecified: Secondary | ICD-10-CM | POA: Diagnosis not present

## 2022-09-06 DIAGNOSIS — R7303 Prediabetes: Secondary | ICD-10-CM | POA: Diagnosis not present

## 2022-09-13 DIAGNOSIS — E2740 Unspecified adrenocortical insufficiency: Secondary | ICD-10-CM | POA: Diagnosis not present

## 2022-09-13 DIAGNOSIS — R399 Unspecified symptoms and signs involving the genitourinary system: Secondary | ICD-10-CM | POA: Diagnosis not present

## 2022-09-13 DIAGNOSIS — E038 Other specified hypothyroidism: Secondary | ICD-10-CM | POA: Diagnosis not present

## 2022-09-13 DIAGNOSIS — D352 Benign neoplasm of pituitary gland: Secondary | ICD-10-CM | POA: Diagnosis not present

## 2022-09-13 DIAGNOSIS — E230 Hypopituitarism: Secondary | ICD-10-CM | POA: Diagnosis not present

## 2022-10-02 DIAGNOSIS — E038 Other specified hypothyroidism: Secondary | ICD-10-CM | POA: Diagnosis not present

## 2022-10-02 DIAGNOSIS — D352 Benign neoplasm of pituitary gland: Secondary | ICD-10-CM | POA: Diagnosis not present

## 2022-10-02 DIAGNOSIS — E230 Hypopituitarism: Secondary | ICD-10-CM | POA: Diagnosis not present

## 2022-10-02 DIAGNOSIS — R399 Unspecified symptoms and signs involving the genitourinary system: Secondary | ICD-10-CM | POA: Diagnosis not present

## 2022-10-02 DIAGNOSIS — E2740 Unspecified adrenocortical insufficiency: Secondary | ICD-10-CM | POA: Diagnosis not present

## 2022-10-25 ENCOUNTER — Ambulatory Visit: Payer: Medicare Other | Admitting: Cardiology

## 2022-10-25 ENCOUNTER — Encounter: Payer: Self-pay | Admitting: Cardiology

## 2022-10-25 VITALS — BP 114/78 | HR 85 | Resp 16 | Ht 70.0 in | Wt 221.0 lb

## 2022-10-25 DIAGNOSIS — R0609 Other forms of dyspnea: Secondary | ICD-10-CM

## 2022-10-25 DIAGNOSIS — I951 Orthostatic hypotension: Secondary | ICD-10-CM

## 2022-10-25 NOTE — Progress Notes (Signed)
Follow up visit  Subjective:   Julian Harris, male    DOB: 1941/03/18, 82 y.o.   MRN: [id number]     HPI  Chief Complaint  Patient presents with   Loss of Consciousness   Follow-up    3 month    82 year-old Caucasian male with adrenal insufficiency, pituitary macroadenoma, central hypothyroidism, with lightheadedness  Patient was hospitalized in 07/2022 for COVID, frequent falls, and was also found to be in adrenal crisis.  He was treated with stress dose steroids.  He is now on hydrocortisone.  Blood pressure is high at times during the daytime.  Lightheadedness is improved.  He does endorse exertional dyspnea, more so than about a year ago.  Denies any chest pain.  OV 03/2020: Patient was diagnosed with adrenal insufficiency in March 2020.  He is seeing endocrinologist Dr. Garnet Koyanagi for management of the same.  He is currently on prednisone 5 mg daily, and levothyroxine 25 mcg daily.  He has had serial MRIs, with no increase in the size of pituitary macroadenoma.  His orthostatic hypotension has significantly improved.  He was unable to even stand back in March 2020, this is since resolved.  However, he continues to have episodes of lightheadedness, particularly during the early part of the day.  He also notices exertional dyspnea symptoms.  He tells me that he has gained several pounds in the last year or so, while on prednisone.  He denies any chest pain, presyncope or syncope.  Also denies any significant orthopnea, PND.   Current Outpatient Medications:    finasteride (PROSCAR) 5 MG tablet, Take 1 tablet by mouth daily., Disp: , Rfl:    levothyroxine (SYNTHROID) 75 MCG tablet, Take 1 tablet (75 mcg total) by mouth daily at 6 (six) AM., Disp: 30 tablet, Rfl: 2   rOPINIRole (REQUIP) 1 MG tablet, Take 1 mg by mouth at bedtime. Taking 1-3 hrs before bedtime., Disp: , Rfl:    sulfamethoxazole-trimethoprim (BACTRIM DS) 800-160 MG tablet, Take 1 tablet by mouth 2 (two) times daily., Disp:  6 tablet, Rfl: 0   tamsulosin (FLOMAX) 0.4 MG CAPS capsule, Take 1 capsule by mouth every evening., Disp: , Rfl:     Cardiovascular & other pertient studies:  EKG 07/21/2022: Sinus rhythm 76 bpm  Low voltage in precordial leads Left atrial enlargement  Echocardiogram 04/30/2020:  Study Quality: Technically difficult study.  Normal LV systolic function with visual EF 60-65%. Left ventricle cavity  is normal in size. Normal global wall motion. Normal diastolic filling  pattern, normal LAP. Calculated EF 65%.  Mild (Grade I) aortic regurgitation.  Native mitral valve with trace regurgitation. Mild calcification of the  mitral valve annulus. Mild mitral valve stenosis.  Compared to prior study dated 10/15/2018 AR and MS are new.   EKG 04/23/2020: Sinus rhythm 68 bpm Low voltage, otherwise normal EKG  MRI brain 06/2019: 1. 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 Suspected early invasion of the right cavernous sinus, and mild bulging into the suprasellar cistern which effaces the optic chiasm. 2. Otherwise normal for age MRI appearance of the brain.   Recent labs: 06/13/2022: Chol 175, TG 188, HDL 69,   06/2021: Cr 1.2  6/202: TSH 0.01  09/2020: BUN 17  2021: LDL 125   04/21/2020: BUN 17  05/2019: Chol 195, TG 109, HDL 59, LDL 117 TSH 0.3  3//2020: Glucose 87, BUN/Cr 5/1.05. EGFR >60. Na/K 136/3.5.  H/H  13.9/40.4. MCV 94. Platelets 231   Review of Systems  Cardiovascular:  Positive for dyspnea on exertion. Negative for chest pain, leg swelling, palpitations and syncope.  Neurological:  Positive for dizziness and light-headedness.         Vitals:   10/25/22 1434 10/25/22 1435  BP:    Pulse:    Resp:    SpO2: 98% 96%   Orthostatic VS for the past 72 hrs (Last 3 readings):  Orthostatic BP Patient Position BP Location Cuff Size Orthostatic Pulse  10/25/22 1435 109/72  Standing Left Arm Normal 92  10/25/22 1434 113/77 Sitting Left Arm Normal 81  10/25/22 1433 (!) 134/91 Supine Left Arm Normal 81     Body mass index is 31.71 kg/m. Filed Weights   10/25/22 1430  Weight: 221 lb (100.2 kg)     Objective:   Physical Exam Vitals and nursing note reviewed.  Constitutional:      General: He is not in acute distress.    Appearance: He is obese.  Neck:     Vascular: No JVD.  Cardiovascular:     Rate and Rhythm: Normal rate and regular rhythm.     Heart sounds: Normal heart sounds. No murmur heard. Pulmonary:     Effort: Pulmonary effort is normal.     Breath sounds: Normal breath sounds. No wheezing or rales.           Assessment & Recommendations:   82 year-old Caucasian male with adrenal insufficiency, pituitary macroadenoma, central hypothyroidism, with lightheadedness  Exertional dyspnea: Stable, likely multifactorial, including obesity, deconditioning.  Low suspicion for angina equivalent at this time.  No severe abnormalities on last echocardiogram (12/2019). Will obtain echocardiogram.  Lightheadedness: Mild orthostatic hypotension, but symptoms have improved.  Continue management as per endocrinology.  Pituitary macroadenoma: Continue management as per endocarinology.  F/u in 6 months    [name], MD Pager: [phone] Office: [phone]

## 2022-11-02 DIAGNOSIS — Z87440 Personal history of urinary (tract) infections: Secondary | ICD-10-CM | POA: Diagnosis not present

## 2022-11-02 DIAGNOSIS — D352 Benign neoplasm of pituitary gland: Secondary | ICD-10-CM | POA: Diagnosis not present

## 2022-11-02 DIAGNOSIS — E230 Hypopituitarism: Secondary | ICD-10-CM | POA: Diagnosis not present

## 2022-11-02 DIAGNOSIS — R531 Weakness: Secondary | ICD-10-CM | POA: Diagnosis not present

## 2022-11-02 DIAGNOSIS — E038 Other specified hypothyroidism: Secondary | ICD-10-CM | POA: Diagnosis not present

## 2022-11-02 DIAGNOSIS — E2740 Unspecified adrenocortical insufficiency: Secondary | ICD-10-CM | POA: Diagnosis not present

## 2022-11-09 DIAGNOSIS — H02105 Unspecified ectropion of left lower eyelid: Secondary | ICD-10-CM | POA: Diagnosis not present

## 2022-11-09 DIAGNOSIS — H2513 Age-related nuclear cataract, bilateral: Secondary | ICD-10-CM | POA: Diagnosis not present

## 2022-11-09 DIAGNOSIS — H52203 Unspecified astigmatism, bilateral: Secondary | ICD-10-CM | POA: Diagnosis not present

## 2022-11-09 DIAGNOSIS — H02102 Unspecified ectropion of right lower eyelid: Secondary | ICD-10-CM | POA: Diagnosis not present

## 2022-11-09 DIAGNOSIS — H5203 Hypermetropia, bilateral: Secondary | ICD-10-CM | POA: Diagnosis not present

## 2022-11-15 DIAGNOSIS — M5459 Other low back pain: Secondary | ICD-10-CM | POA: Diagnosis not present

## 2022-11-23 ENCOUNTER — Ambulatory Visit: Payer: Medicare Other

## 2022-11-23 DIAGNOSIS — R0609 Other forms of dyspnea: Secondary | ICD-10-CM

## 2022-11-24 DIAGNOSIS — M5459 Other low back pain: Secondary | ICD-10-CM | POA: Diagnosis not present

## 2022-11-28 DIAGNOSIS — M545 Low back pain, unspecified: Secondary | ICD-10-CM | POA: Diagnosis not present

## 2022-11-28 DIAGNOSIS — M5459 Other low back pain: Secondary | ICD-10-CM | POA: Diagnosis not present

## 2022-12-01 DIAGNOSIS — M545 Low back pain, unspecified: Secondary | ICD-10-CM | POA: Diagnosis not present

## 2022-12-07 DIAGNOSIS — M5416 Radiculopathy, lumbar region: Secondary | ICD-10-CM | POA: Diagnosis not present

## 2022-12-12 DIAGNOSIS — E230 Hypopituitarism: Secondary | ICD-10-CM | POA: Diagnosis not present

## 2022-12-13 DIAGNOSIS — N401 Enlarged prostate with lower urinary tract symptoms: Secondary | ICD-10-CM | POA: Diagnosis not present

## 2022-12-13 DIAGNOSIS — R351 Nocturia: Secondary | ICD-10-CM | POA: Diagnosis not present

## 2022-12-13 DIAGNOSIS — R3912 Poor urinary stream: Secondary | ICD-10-CM | POA: Diagnosis not present

## 2022-12-19 DIAGNOSIS — E230 Hypopituitarism: Secondary | ICD-10-CM | POA: Diagnosis not present

## 2022-12-19 DIAGNOSIS — R7309 Other abnormal glucose: Secondary | ICD-10-CM | POA: Diagnosis not present

## 2022-12-19 DIAGNOSIS — D352 Benign neoplasm of pituitary gland: Secondary | ICD-10-CM | POA: Diagnosis not present

## 2022-12-19 DIAGNOSIS — E2740 Unspecified adrenocortical insufficiency: Secondary | ICD-10-CM | POA: Diagnosis not present

## 2022-12-19 DIAGNOSIS — E038 Other specified hypothyroidism: Secondary | ICD-10-CM | POA: Diagnosis not present

## 2022-12-22 DIAGNOSIS — M545 Low back pain, unspecified: Secondary | ICD-10-CM | POA: Diagnosis not present

## 2022-12-28 DIAGNOSIS — M48062 Spinal stenosis, lumbar region with neurogenic claudication: Secondary | ICD-10-CM | POA: Diagnosis not present

## 2022-12-28 DIAGNOSIS — M7138 Other bursal cyst, other site: Secondary | ICD-10-CM | POA: Diagnosis not present

## 2023-01-01 ENCOUNTER — Other Ambulatory Visit: Payer: Self-pay | Admitting: Neurosurgery

## 2023-01-02 NOTE — Progress Notes (Signed)
     Your surgery and Pre-Admission testing visit will be at [facility] located at [address], [place], [place] [address].  Please let all your doctors (i.e., Primary Care Physician, Cardiologist, Endocrinologist, Pulmonologist) know you are having surgery. You may need clearance for surgery. If you are on blood thinners, notify your surgeon and ask the doctor who prescribed them how long to hold them before surgery.  If you have had a heart test, such as an EKG, stress test, heart ultrasound, etc., or lab work performed outside of [place], please bring copies of these tests to your Pre-Admission testing, if possible.  These departments may contact you before the day of surgery:  Pre-Service Center - insurance/ billing: [phone] Pharmacy- to review your medications: [phone] Pre-Admission Testing- to set an appointment for your visit: [phone]  (Often, these numbers show up as "SPAM" on your phone)  The Pre-Admission Testing (PAT) visit focuses on Anesthesia for your upcoming surgery.  You do NOT need to fast; take your medications as usual. Please arrive 30 minutes early to allow for parking and admitting.  The visit may last up to an hour. Bring a photo ID and medical insurance card. Reschedule if you are sick. ([phone]) and please, NO children under age 16 at the visit.  During the PAT visit:  We will review your medical and surgical history.   You will receive pre-operative instructions, including the time of arrival at the hospital and surgical start time.  We will review what medication(s) you can take on the day of surgery.  After speaking with the nurse, you will have blood drawn and, if needed, a chest x-ray and EKG.  Most lab results from your doctor are good for 30 days, Hemoglobin A1C is good for 60 days. If you cannot talk to the Pharmacy, bring your medications or a list of them to the PST visit.   Infection control for the [facility] requires: All fingernail and toenail products should be removed before the day of surgery.  (SNS, Acrylic, Gel, Polish, Stickers, Press on, and Poly gel nails.)   Parking information:  Address: [facility] - [address], [place], [place] [address]  Please look for signs for entrance A off of [address]. Free valet parking is available Monday-Friday 05:30am-06:00pm     

## 2023-01-03 ENCOUNTER — Encounter (HOSPITAL_COMMUNITY): Payer: Self-pay

## 2023-01-03 NOTE — Pre-Procedure Instructions (Signed)
Surgical Instructions    Your procedure is scheduled on Tuesday, June 11th.  Report to [facility] Main Entrance "A" at 1:15 P.M., then check in with the Admitting office.  Call this number if you have problems the morning of surgery:  [phone]  If you have any questions prior to your surgery date call [phone]: Open Monday-Friday 8am-4pm If you experience any cold or flu symptoms such as cough, fever, chills, shortness of breath, etc. between now and your scheduled surgery, please notify us at the above number.     Remember:  Do not eat after midnight the night before your surgery  You may drink clear liquids until 12:15 PM the day of your surgery.   Clear liquids allowed are: Water, Non-Citrus Juices (without pulp), Carbonated Beverages, Clear Tea, Black Coffee Only (NO MILK, CREAM OR POWDERED CREAMER of any kind), and Gatorade.    Take these medicines the morning of surgery with A SIP OF WATER  finasteride (PROSCAR)  hydrocortisone (CORTEF)  levothyroxine (SYNTHROID)     As of today, STOP taking any Aspirin (unless otherwise instructed by your surgeon) Aleve, Naproxen, Ibuprofen, Motrin, Advil, Goody's, BC's, all herbal medications, fish oil, and all vitamins.                     Do NOT Smoke (Tobacco/Vaping) for 24 hours prior to your procedure.  If you use a CPAP at night, you may bring your mask/headgear for your overnight stay.   Contacts, glasses, piercing's, hearing aid's, dentures or partials may not be worn into surgery, please bring cases for these belongings.    For patients admitted to the hospital, discharge time will be determined by your treatment team.   Patients discharged the day of surgery will not be allowed to drive home, and someone needs to stay with them for 24 hours.  SURGICAL WAITING ROOM VISITATION Patients having surgery or a procedure may have no more than 2 support people in the waiting area - these visitors may rotate.   Children under  the age of 75 must have an adult with them who is not the patient. If the patient needs to stay at the hospital during part of their recovery, the visitor guidelines for inpatient rooms apply. Pre-op nurse will coordinate an appropriate time for 1 support person to accompany patient in pre-op.  This support person may not rotate.   Please refer to the [facility] website for the visitor guidelines for Inpatients (after your surgery is over and you are in a regular room).     Pre-operative 5 CHG Bath Instructions   You can play a key role in reducing the risk of infection after surgery. Your skin needs to be as free of germs as possible. You can reduce the number of germs on your skin by washing with CHG (chlorhexidine gluconate) soap before surgery. CHG is an antiseptic soap that kills germs and continues to kill germs even after washing.   DO NOT use if you have an allergy to chlorhexidine/CHG or antibacterial soaps. If your skin becomes reddened or irritated, stop using the CHG and notify one of our RNs at [phone].   Please shower with the CHG soap starting 4 days before surgery using the following schedule:     Please keep in mind the following:  DO NOT shave, including legs and underarms, starting the day of your first shower.   You may shave your face at any point before/day of surgery.  Place clean  sheets on your bed the day you start using CHG soap. Use a clean washcloth (not used since being washed) for each shower. DO NOT sleep with pets once you start using the CHG.   CHG Shower Instructions:  If you choose to wash your hair and private area, wash first with your normal shampoo/soap.  After you use shampoo/soap, rinse your hair and body thoroughly to remove shampoo/soap residue.  Turn the water OFF and apply about 3 tablespoons (45 ml) of CHG soap to a CLEAN washcloth.  Apply CHG soap ONLY FROM YOUR NECK DOWN TO YOUR TOES (washing for 3-5 minutes)  DO NOT use CHG soap on  face, private areas, open wounds, or sores.  Pay special attention to the area where your surgery is being performed.  If you are having back surgery, having someone wash your back for you may be helpful. Wait 2 minutes after CHG soap is applied, then you may rinse off the CHG soap.  Pat dry with a clean towel  Put on clean clothes/pajamas   If you choose to wear lotion, please use ONLY the CHG-compatible lotions on the back of this paper.     Additional instructions for the day of surgery: DO NOT APPLY any lotions, deodorants, cologne, or perfumes.   Put on clean/comfortable clothes.  Brush your teeth.  Ask your nurse before applying any prescription medications to the skin. Do not wear jewelry  Do not bring valuables to the hospital. [facility] is not responsible for any belongings or valuables.      CHG Compatible Lotions   Aveeno Moisturizing lotion  Cetaphil Moisturizing Cream  Cetaphil Moisturizing Lotion  Clairol Herbal Essence Moisturizing Lotion, Dry Skin  Clairol Herbal Essence Moisturizing Lotion, Extra Dry Skin  Clairol Herbal Essence Moisturizing Lotion, Normal Skin  Curel Age Defying Therapeutic Moisturizing Lotion with Alpha Hydroxy  Curel Extreme Care Body Lotion  Curel Soothing Hands Moisturizing Hand Lotion  Curel Therapeutic Moisturizing Cream, Fragrance-Free  Curel Therapeutic Moisturizing Lotion, Fragrance-Free  Curel Therapeutic Moisturizing Lotion, Original Formula  Eucerin Daily Replenishing Lotion  Eucerin Dry Skin Therapy Plus Alpha Hydroxy Crme  Eucerin Dry Skin Therapy Plus Alpha Hydroxy Lotion  Eucerin Original Crme  Eucerin Original Lotion  Eucerin Plus Crme Eucerin Plus Lotion  Eucerin TriLipid Replenishing Lotion  Keri Anti-Bacterial Hand Lotion  Keri Deep Conditioning Original Lotion Dry Skin Formula Softly Scented  Keri Deep Conditioning Original Lotion, Fragrance Free Sensitive Skin Formula  Keri Lotion Fast Absorbing Fragrance Free  Sensitive Skin Formula  Keri Lotion Fast Absorbing Softly Scented Dry Skin Formula  Keri Original Lotion  Keri Skin Renewal Lotion Keri Silky Smooth Lotion  Keri Silky Smooth Sensitive Skin Lotion  Nivea Body Creamy Conditioning Oil  Nivea Body Extra Enriched Lotion  Nivea Body Original Lotion  Nivea Body Sheer Moisturizing Lotion Nivea Crme  Nivea Skin Firming Lotion  NutraDerm 30 Skin Lotion  NutraDerm Skin Lotion  NutraDerm Therapeutic Skin Cream  NutraDerm Therapeutic Skin Lotion  ProShield Protective Hand Cream  Provon moisturizing lotion         Please read over the following fact sheets that you were given.    If you received a COVID test during your pre-op visit  it is requested that you wear a mask when out in public, stay away from anyone that may not be feeling well and notify your surgeon if you develop symptoms. If you have been in contact with anyone that has tested positive in the last 10 days  please notify you surgeon.

## 2023-01-03 NOTE — Progress Notes (Signed)
Chest x-ray - 08/26/22 (1V) EKG - 08/06/22 Stress Test - n/a PVC ECHO - 11/23/22 Cardiac Cath - n/a  ICD Pacemaker/Loop - n/a  Sleep Study -  n/a CPAP - none  Diabetes Type - n/a   ERAS: Clear liquids til 12:15 PM DOS  Anesthesia review: Yes  STOP now taking any Aspirin (unless otherwise instructed by your surgeon), Aleve, Naproxen, Ibuprofen, Motrin, Advil, Goody's, BC's, all herbal medications, fish oil, and all vitamins.   Coronavirus Screening Do you have any of the following symptoms:  Cough yes/no: No Fever (>100.65F)  yes/no: No Runny nose yes/no: No Sore throat yes/no: No Difficulty breathing/shortness of breath  Yes  Have you traveled in the last 14 days and where? yes/no: No  Patient verbalized understanding of instructions that were given to them at the PAT appointment. Patient was also instructed that they will need to review over the PAT instructions again at home before surgery.

## 2023-01-04 ENCOUNTER — Encounter (HOSPITAL_COMMUNITY): Payer: Self-pay

## 2023-01-04 ENCOUNTER — Encounter (HOSPITAL_COMMUNITY)
Admission: RE | Admit: 2023-01-04 | Discharge: 2023-01-04 | Disposition: A | Discharge status: Home / Self Care | Payer: Medicare Other | Source: Ambulatory Visit | Attending: Neurosurgery | Admitting: Neurosurgery

## 2023-01-04 VITALS — BP 135/92 | HR 69 | Temp 97.6°F | Resp 18 | Ht 69.0 in | Wt 213.8 lb

## 2023-01-04 DIAGNOSIS — E039 Hypothyroidism, unspecified: Secondary | ICD-10-CM | POA: Diagnosis not present

## 2023-01-04 DIAGNOSIS — Z01812 Encounter for preprocedural laboratory examination: Secondary | ICD-10-CM | POA: Insufficient documentation

## 2023-01-04 DIAGNOSIS — E2740 Unspecified adrenocortical insufficiency: Secondary | ICD-10-CM | POA: Diagnosis not present

## 2023-01-04 DIAGNOSIS — D352 Benign neoplasm of pituitary gland: Secondary | ICD-10-CM | POA: Diagnosis not present

## 2023-01-04 DIAGNOSIS — Z01818 Encounter for other preprocedural examination: Secondary | ICD-10-CM

## 2023-01-04 DIAGNOSIS — I081 Rheumatic disorders of both mitral and tricuspid valves: Secondary | ICD-10-CM | POA: Insufficient documentation

## 2023-01-04 HISTORY — DX: Unspecified hearing loss, unspecified ear: H91.90

## 2023-01-04 LAB — CBC
HCT: 42.3 % (ref 39.0–52.0)
Hemoglobin: 13.6 g/dL (ref 13.0–17.0)
MCH: 31.4 pg (ref 26.0–34.0)
MCHC: 32.2 g/dL (ref 30.0–36.0)
MCV: 97.7 fL (ref 80.0–100.0)
Platelets: 198 10*3/uL (ref 150–400)
RBC: 4.33 MIL/uL (ref 4.22–5.81)
RDW: 14.2 % (ref 11.5–15.5)
WBC: 7.2 10*3/uL (ref 4.0–10.5)
nRBC: 0 % (ref 0.0–0.2)

## 2023-01-04 LAB — BASIC METABOLIC PANEL
Anion gap: 7 (ref 5–15)
BUN: 18 mg/dL (ref 8–23)
CO2: 26 mmol/L (ref 22–32)
Calcium: 9.1 mg/dL (ref 8.9–10.3)
Chloride: 108 mmol/L (ref 98–111)
Creatinine, Ser: 1.16 mg/dL (ref 0.61–1.24)
GFR, Estimated: >60 mL/min (ref >60)
Glucose, Bld: 107 mg/dL — ABNORMAL HIGH (ref 70–99)
Potassium: 4.9 mmol/L (ref 3.5–5.1)
Sodium: 141 mmol/L (ref 135–145)

## 2023-01-04 LAB — BPAM RBC
Blood Product Expiration Date: 202407122359
Unit Type and Rh: 9500
Unit Type and Rh: 9500

## 2023-01-04 LAB — TYPE AND SCREEN
ABO/RH(D): O NEG
Antibody Screen: NEGATIVE
Donor AG Type: NEGATIVE
Unit division: 0
Unit division: 0

## 2023-01-04 LAB — SURGICAL PCR SCREEN
MRSA, PCR: NEGATIVE
Staphylococcus aureus: NEGATIVE

## 2023-01-05 NOTE — Progress Notes (Signed)
Anesthesia Chart Review:  82 year old male with pertinent history including adrenal insufficiency, pituitary macroadenoma, central hypothyroidism, adrenal insufficiency. Patient was hospitalized in 07/2022 for COVID, frequent falls, and was also found to be in adrenal crisis.  He was treated with stress dose steroids.  He is maintained on hydrocortisone 25mg  daily.  Most recent echocardiogram 11/23/2022 showed moderate LVH, EF 50 to 55%, no significant valvular abnormalities.  Preop labs reviewed, WNL.  EKG 08/29/2022: Sinus rhythm with fusion. Cannot rule out anterior infarct, age undetermined.   Echocardiogram 11/23/2022:  Left ventricle cavity is normal in size. Moderate concentric hypertrophy  of the left ventricle. Normal global wall motion. Normal LV systolic  function with visual EF 50-55%. Indeterminate diastolic filling pattern,  normal LAP.  Trileaflet aortic valve. Aortic sclerosis. Trace aortic regurgitation.  Mild calcification of the mitral valve annulus. Mild mitral valve leaflet  thickening with mild calcification. Mildly restricted mitral valve  leaflets. Trace mitral regurgitation.  Mild tricuspid regurgitation. Estimated pulmonary artery systolic pressure  33 mmHg.  Compared to previous study in 2021, aortic sclerosis, mod LVH are new.  LVEF was reported 60-65% then.   EKG 04/23/2020: Sinus rhythm 68 bpm Low voltage, otherwise normal EKG   MRI brain 06/2019: 1. 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 Suspected early invasion of the right cavernous sinus, and mild bulging into the suprasellar cistern which effaces the optic chiasm. 2. Otherwise normal for age MRI appearance of the brain.    Zannie Cove [facility] Stay [facility]/Anesthesiology Phone [phone] 01/05/2023 10:54 AM

## 2023-01-08 NOTE — Anesthesia Preprocedure Evaluation (Signed)
Anesthesia Evaluation  Patient identified by MRN, date of birth, ID band Patient awake    Reviewed: Allergy & Precautions, NPO status , Patient's Chart, lab work & pertinent test results  Airway Mallampati: II  TM Distance: >3 FB Neck ROM: Full    Dental  (+) Dental Advisory Given   Pulmonary former smoker   breath sounds clear to auscultation       Cardiovascular hypertension,  Rhythm:Regular Rate:Normal  Echo: Left ventricle cavity is normal in size. Moderate concentric hypertrophy  of the left ventricle. Normal global wall motion. Normal LV systolic  function with visual EF 50-55%. Indeterminate diastolic filling pattern,  normal LAP.  Trileaflet aortic valve. Aortic sclerosis. Trace aortic regurgitation.  Mild calcification of the mitral valve annulus. Mild mitral valve leaflet  thickening with mild calcification. Mildly restricted mitral valve  leaflets. Trace mitral regurgitation.  Mild tricuspid regurgitation. Estimated pulmonary artery systolic pressure  33 mmHg.     Neuro/Psych negative neurological ROS     GI/Hepatic negative GI ROS, Neg liver ROS,,,  Endo/Other  Hypothyroidism  Pituitary macroadenoma and adrenal insufficiency  Renal/GU negative Renal ROS     Musculoskeletal   Abdominal   Peds  Hematology negative hematology ROS (+)   Anesthesia Other Findings   Reproductive/Obstetrics                             Anesthesia Physical Anesthesia Plan  ASA: 3  Anesthesia Plan: General   Post-op Pain Management: Tylenol PO (pre-op)*   Induction: Intravenous  PONV Risk Score and Plan: 2 and Dexamethasone, Ondansetron and Treatment may vary due to age or medical condition  Airway Management Planned: Oral ETT  Additional Equipment: None  Intra-op Plan:   Post-operative Plan: Extubation in OR  Informed Consent: I have reviewed the patients History and Physical, chart,  labs and discussed the procedure including the risks, benefits and alternatives for the proposed anesthesia with the patient or authorized representative who has indicated his/her understanding and acceptance.     Dental advisory given  Plan Discussed with: CRNA  Anesthesia Plan Comments: (PAT note by Antionette Poles, PA-C:  82 year old male with pertinent history including adrenal insufficiency, pituitary macroadenoma, central hypothyroidism, adrenal insufficiency. Patient was hospitalized in 07/2022 for COVID, frequent falls, and was also found to be in adrenal crisis.  He was treated with stress dose steroids.    He is now maintained on hydrocortisone 15 to 20 mg in the morning and 10 mg in the afternoon.  This is monitored by his PCP Dr. Irena Reichmann at [facility].  I reached out to Dr. Ethelene Browns office on 01/08/2023 to see if they have received perioperative stress dosing instructions.  I was advised that they have sent a request for instructions but as of yet have not received any response.  I was also advised that Dr. Jordan Likes is aware and will manage perioperatively if no instructions are received.  Follows with cardiologist Dr. Rosemary Holms for history of mild orthostatic hypotension and exertional dyspnea.  Last seen 10/25/2022 and updated echo was ordered at that time.  Echo 11/23/2022 showed moderate LVH, EF 50 to 55%, no significant valvular abnormalities.  No changes to management, recommended continue liberal fluid intake, follow-up in 6 months.  Preop labs reviewed, WNL.  EKG 08/29/2022: Sinus rhythm with fusion. Cannot rule out anterior infarct, age undetermined.   Echocardiogram 11/23/2022:  Left ventricle cavity is normal in size. Moderate concentric hypertrophy  of  the left ventricle. Normal global wall motion. Normal LV systolic  function with visual EF 50-55%. Indeterminate diastolic filling pattern,  normal LAP.  Trileaflet aortic valve. Aortic sclerosis. Trace aortic  regurgitation.  Mild calcification of the mitral valve annulus. Mild mitral valve leaflet  thickening with mild calcification. Mildly restricted mitral valve  leaflets. Trace mitral regurgitation.  Mild tricuspid regurgitation. Estimated pulmonary artery systolic pressure  33 mmHg.  Compared to previous study in 2021, aortic sclerosis, mod LVH are new.  LVEF was reported 60-65% then.   EKG 04/23/2020: Sinus rhythm 68 bpm Low voltage, otherwise normal EKG  MRI brain 06/2019: 1. 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 Suspected early invasion of the right cavernous sinus, and mild bulging into the suprasellar cistern which effaces the optic chiasm. 2. Otherwise normal for age MRI appearance of the brain.  )        Anesthesia Quick Evaluation

## 2023-01-09 ENCOUNTER — Ambulatory Visit (HOSPITAL_BASED_OUTPATIENT_CLINIC_OR_DEPARTMENT_OTHER): Payer: Medicare Other | Admitting: Physician Assistant

## 2023-01-09 ENCOUNTER — Observation Stay (HOSPITAL_COMMUNITY)
Admission: RE | Admit: 2023-01-09 | Discharge: 2023-01-10 | Disposition: A | Discharge status: Home / Self Care | Payer: Medicare Other | Attending: Neurosurgery | Admitting: Neurosurgery

## 2023-01-09 ENCOUNTER — Ambulatory Visit (HOSPITAL_COMMUNITY)
Admission: RE | Disposition: A | Discharge status: Home / Self Care | Payer: Self-pay | Source: Home / Self Care | Attending: Neurosurgery

## 2023-01-09 ENCOUNTER — Other Ambulatory Visit: Payer: Self-pay

## 2023-01-09 ENCOUNTER — Ambulatory Visit (HOSPITAL_COMMUNITY): Payer: Medicare Other | Admitting: Physician Assistant

## 2023-01-09 ENCOUNTER — Encounter (HOSPITAL_COMMUNITY): Payer: Self-pay | Admitting: Neurosurgery

## 2023-01-09 ENCOUNTER — Ambulatory Visit (HOSPITAL_COMMUNITY): Payer: Medicare Other

## 2023-01-09 DIAGNOSIS — M7138 Other bursal cyst, other site: Secondary | ICD-10-CM

## 2023-01-09 DIAGNOSIS — D352 Benign neoplasm of pituitary gland: Secondary | ICD-10-CM | POA: Diagnosis not present

## 2023-01-09 DIAGNOSIS — M5116 Intervertebral disc disorders with radiculopathy, lumbar region: Secondary | ICD-10-CM | POA: Diagnosis not present

## 2023-01-09 DIAGNOSIS — Z96653 Presence of artificial knee joint, bilateral: Secondary | ICD-10-CM | POA: Insufficient documentation

## 2023-01-09 DIAGNOSIS — E039 Hypothyroidism, unspecified: Secondary | ICD-10-CM | POA: Diagnosis not present

## 2023-01-09 DIAGNOSIS — I10 Essential (primary) hypertension: Secondary | ICD-10-CM | POA: Insufficient documentation

## 2023-01-09 DIAGNOSIS — Z79899 Other long term (current) drug therapy: Secondary | ICD-10-CM | POA: Insufficient documentation

## 2023-01-09 DIAGNOSIS — M48062 Spinal stenosis, lumbar region with neurogenic claudication: Secondary | ICD-10-CM | POA: Diagnosis not present

## 2023-01-09 DIAGNOSIS — Z01818 Encounter for other preprocedural examination: Secondary | ICD-10-CM

## 2023-01-09 DIAGNOSIS — Z87891 Personal history of nicotine dependence: Secondary | ICD-10-CM | POA: Insufficient documentation

## 2023-01-09 DIAGNOSIS — Z981 Arthrodesis status: Secondary | ICD-10-CM | POA: Diagnosis not present

## 2023-01-09 HISTORY — PX: LUMBAR LAMINECTOMY/DECOMPRESSION MICRODISCECTOMY: SHX5026

## 2023-01-09 LAB — TYPE AND SCREEN
ABO/RH(D): O NEG
Antibody Screen: NEGATIVE

## 2023-01-09 SURGERY — LUMBAR LAMINECTOMY/DECOMPRESSION MICRODISCECTOMY 1 LEVEL
Anesthesia: General | Site: Back | Laterality: Bilateral

## 2023-01-09 MED ORDER — ONDANSETRON HCL 4 MG/2ML IJ SOLN
INTRAMUSCULAR | Status: AC
  Filled 2023-01-09: qty 2

## 2023-01-09 MED ORDER — ALBUMIN HUMAN 5 % IV SOLN: INTRAVENOUS | Status: DC | PRN

## 2023-01-09 MED ORDER — CEFAZOLIN SODIUM-DEXTROSE 1-4 GM/50ML-% IV SOLN
1.0000 g | Freq: Three times a day (TID) | INTRAVENOUS | Status: AC
  Administered 2023-01-09 – 2023-01-10 (×2): 1 g via INTRAVENOUS
  Filled 2023-01-09 (×2): qty 50

## 2023-01-09 MED ORDER — DEXAMETHASONE SODIUM PHOSPHATE 10 MG/ML IJ SOLN
INTRAMUSCULAR | Status: AC
  Filled 2023-01-09: qty 1

## 2023-01-09 MED ORDER — DEXAMETHASONE SODIUM PHOSPHATE 10 MG/ML IJ SOLN
INTRAMUSCULAR | Status: DC | PRN
  Administered 2023-01-09: 5 mg via INTRAVENOUS

## 2023-01-09 MED ORDER — CHLORHEXIDINE GLUCONATE 0.12 % MT SOLN
OROMUCOSAL | Status: AC
  Filled 2023-01-09: qty 15

## 2023-01-09 MED ORDER — HYDROCODONE-ACETAMINOPHEN 10-325 MG PO TABS: 1.0000 | ORAL_TABLET | ORAL | Status: DC | PRN

## 2023-01-09 MED ORDER — PHENOL 1.4 % MT LIQD: 1.0000 | OROMUCOSAL | Status: DC | PRN

## 2023-01-09 MED ORDER — FENTANYL CITRATE (PF) 100 MCG/2ML IJ SOLN
INTRAMUSCULAR | Status: AC
  Filled 2023-01-09: qty 2

## 2023-01-09 MED ORDER — PHENYLEPHRINE 80 MCG/ML (10ML) SYRINGE FOR IV PUSH (FOR BLOOD PRESSURE SUPPORT)
PREFILLED_SYRINGE | INTRAVENOUS | Status: DC | PRN
  Administered 2023-01-09 (×2): 160 ug via INTRAVENOUS

## 2023-01-09 MED ORDER — FINASTERIDE 5 MG PO TABS
5.0000 mg | ORAL_TABLET | Freq: Every day | ORAL | Status: DC
  Administered 2023-01-10: 5 mg via ORAL
  Filled 2023-01-09: qty 1

## 2023-01-09 MED ORDER — ORAL CARE MOUTH RINSE: 15.0000 mL | Freq: Once | OROMUCOSAL | Status: AC

## 2023-01-09 MED ORDER — THROMBIN (RECOMBINANT) 5000 UNITS EX SOLR: CUTANEOUS | Status: DC | PRN

## 2023-01-09 MED ORDER — CHLORHEXIDINE GLUCONATE 0.12 % MT SOLN
15.0000 mL | Freq: Once | OROMUCOSAL | Status: AC
  Administered 2023-01-09: 15 mL via OROMUCOSAL

## 2023-01-09 MED ORDER — ACETAMINOPHEN 650 MG RE SUPP: 650.0000 mg | RECTAL | Status: DC | PRN

## 2023-01-09 MED ORDER — HYDROCORTISONE 5 MG PO TABS: 5.0000 mg | ORAL_TABLET | ORAL | Status: DC

## 2023-01-09 MED ORDER — KETOROLAC TROMETHAMINE 15 MG/ML IJ SOLN
15.0000 mg | Freq: Four times a day (QID) | INTRAMUSCULAR | Status: DC
  Administered 2023-01-09 – 2023-01-10 (×2): 15 mg via INTRAVENOUS
  Filled 2023-01-09 (×2): qty 1

## 2023-01-09 MED ORDER — SULFAMETHOXAZOLE-TRIMETHOPRIM 800-160 MG PO TABS: 1.0000 | ORAL_TABLET | Freq: Two times a day (BID) | ORAL | Status: DC

## 2023-01-09 MED ORDER — BUPIVACAINE HCL (PF) 0.25 % IJ SOLN
INTRAMUSCULAR | Status: AC
  Filled 2023-01-09: qty 30

## 2023-01-09 MED ORDER — LACTATED RINGERS IV SOLN: INTRAVENOUS | Status: DC

## 2023-01-09 MED ORDER — HYDROMORPHONE HCL 1 MG/ML IJ SOLN: 1.0000 mg | INTRAMUSCULAR | Status: DC | PRN

## 2023-01-09 MED ORDER — ROCURONIUM BROMIDE 10 MG/ML (PF) SYRINGE
PREFILLED_SYRINGE | INTRAVENOUS | Status: AC
  Filled 2023-01-09: qty 10

## 2023-01-09 MED ORDER — ROPINIROLE HCL 1 MG PO TABS
1.0000 mg | ORAL_TABLET | Freq: Every day | ORAL | Status: DC
  Administered 2023-01-09: 1 mg via ORAL
  Filled 2023-01-09: qty 1

## 2023-01-09 MED ORDER — SODIUM CHLORIDE 0.9% FLUSH: 3.0000 mL | INTRAVENOUS | Status: DC | PRN

## 2023-01-09 MED ORDER — FENTANYL CITRATE (PF) 250 MCG/5ML IJ SOLN
INTRAMUSCULAR | Status: DC | PRN
  Administered 2023-01-09 (×3): 50 ug via INTRAVENOUS

## 2023-01-09 MED ORDER — HYDROCODONE-ACETAMINOPHEN 5-325 MG PO TABS: 1.0000 | ORAL_TABLET | ORAL | Status: DC | PRN

## 2023-01-09 MED ORDER — TAMSULOSIN HCL 0.4 MG PO CAPS
0.4000 mg | ORAL_CAPSULE | Freq: Every day | ORAL | Status: DC
  Administered 2023-01-09: 0.4 mg via ORAL
  Filled 2023-01-09: qty 1

## 2023-01-09 MED ORDER — HYDROCORTISONE 20 MG PO TABS
20.0000 mg | ORAL_TABLET | Freq: Every day | ORAL | Status: DC
  Administered 2023-01-10: 20 mg via ORAL
  Filled 2023-01-09: qty 1

## 2023-01-09 MED ORDER — THROMBIN 5000 UNITS EX SOLR
CUTANEOUS | Status: AC
  Filled 2023-01-09: qty 10000

## 2023-01-09 MED ORDER — PROPOFOL 10 MG/ML IV BOLUS
INTRAVENOUS | Status: AC
  Filled 2023-01-09: qty 20

## 2023-01-09 MED ORDER — VASOPRESSIN 20 UNIT/ML IV SOLN
INTRAVENOUS | Status: DC | PRN
  Administered 2023-01-09 (×2): 1 [IU] via INTRAVENOUS

## 2023-01-09 MED ORDER — SODIUM CHLORIDE 0.9% FLUSH
3.0000 mL | Freq: Two times a day (BID) | INTRAVENOUS | Status: DC
  Administered 2023-01-09: 3 mL via INTRAVENOUS

## 2023-01-09 MED ORDER — KETOROLAC TROMETHAMINE 30 MG/ML IJ SOLN
INTRAMUSCULAR | Status: DC | PRN
  Administered 2023-01-09: 30 mg via INTRAVENOUS

## 2023-01-09 MED ORDER — CYCLOBENZAPRINE HCL 10 MG PO TABS: 10.0000 mg | ORAL_TABLET | Freq: Three times a day (TID) | ORAL | Status: DC | PRN

## 2023-01-09 MED ORDER — ONDANSETRON HCL 4 MG/2ML IJ SOLN
INTRAMUSCULAR | Status: DC | PRN
  Administered 2023-01-09: 4 mg via INTRAVENOUS

## 2023-01-09 MED ORDER — HYDROCORTISONE 5 MG PO TABS
5.0000 mg | ORAL_TABLET | Freq: Every day | ORAL | Status: DC
  Administered 2023-01-09: 5 mg via ORAL
  Filled 2023-01-09: qty 1

## 2023-01-09 MED ORDER — LIDOCAINE 2% (20 MG/ML) 5 ML SYRINGE
INTRAMUSCULAR | Status: AC
  Filled 2023-01-09: qty 5

## 2023-01-09 MED ORDER — AMISULPRIDE (ANTIEMETIC) 5 MG/2ML IV SOLN: 10.0000 mg | Freq: Once | INTRAVENOUS | Status: DC | PRN

## 2023-01-09 MED ORDER — CHLORHEXIDINE GLUCONATE CLOTH 2 % EX PADS: 6.0000 | MEDICATED_PAD | Freq: Once | CUTANEOUS | Status: DC

## 2023-01-09 MED ORDER — LEVOTHYROXINE SODIUM 75 MCG PO TABS
75.0000 ug | ORAL_TABLET | Freq: Every day | ORAL | Status: DC
  Administered 2023-01-10: 75 ug via ORAL
  Filled 2023-01-09: qty 1

## 2023-01-09 MED ORDER — ONDANSETRON HCL 4 MG PO TABS: 4.0000 mg | ORAL_TABLET | Freq: Four times a day (QID) | ORAL | Status: DC | PRN

## 2023-01-09 MED ORDER — SODIUM CHLORIDE 0.9 % IV SOLN
250.0000 mL | INTRAVENOUS | Status: DC
  Administered 2023-01-09: 250 mL via INTRAVENOUS

## 2023-01-09 MED ORDER — PHENYLEPHRINE 80 MCG/ML (10ML) SYRINGE FOR IV PUSH (FOR BLOOD PRESSURE SUPPORT)
PREFILLED_SYRINGE | INTRAVENOUS | Status: AC
  Filled 2023-01-09: qty 10

## 2023-01-09 MED ORDER — ACETAMINOPHEN 325 MG PO TABS: 650.0000 mg | ORAL_TABLET | ORAL | Status: DC | PRN

## 2023-01-09 MED ORDER — EPHEDRINE SULFATE-NACL 50-0.9 MG/10ML-% IV SOSY
PREFILLED_SYRINGE | INTRAVENOUS | Status: DC | PRN
  Administered 2023-01-09: 5 mg via INTRAVENOUS
  Administered 2023-01-09: 10 mg via INTRAVENOUS

## 2023-01-09 MED ORDER — MENTHOL 3 MG MT LOZG: 1.0000 | LOZENGE | OROMUCOSAL | Status: DC | PRN

## 2023-01-09 MED ORDER — BUPIVACAINE HCL (PF) 0.25 % IJ SOLN
INTRAMUSCULAR | Status: DC | PRN
  Administered 2023-01-09: 20 mL

## 2023-01-09 MED ORDER — LIDOCAINE 2% (20 MG/ML) 5 ML SYRINGE
INTRAMUSCULAR | Status: DC | PRN
  Administered 2023-01-09: 50 mg via INTRAVENOUS

## 2023-01-09 MED ORDER — ACETAMINOPHEN 500 MG PO TABS
1000.0000 mg | ORAL_TABLET | Freq: Once | ORAL | Status: AC
  Administered 2023-01-09: 1000 mg via ORAL
  Filled 2023-01-09: qty 2

## 2023-01-09 MED ORDER — CEFAZOLIN SODIUM-DEXTROSE 2-4 GM/100ML-% IV SOLN
INTRAVENOUS | Status: AC
  Filled 2023-01-09: qty 100

## 2023-01-09 MED ORDER — 0.9 % SODIUM CHLORIDE (POUR BTL) OPTIME
TOPICAL | Status: DC | PRN
  Administered 2023-01-09: 1000 mL

## 2023-01-09 MED ORDER — SUGAMMADEX SODIUM 200 MG/2ML IV SOLN
INTRAVENOUS | Status: DC | PRN
  Administered 2023-01-09: 200 mg via INTRAVENOUS

## 2023-01-09 MED ORDER — PROPOFOL 10 MG/ML IV BOLUS
INTRAVENOUS | Status: DC | PRN
  Administered 2023-01-09: 120 mg via INTRAVENOUS

## 2023-01-09 MED ORDER — PHENYLEPHRINE HCL-NACL 20-0.9 MG/250ML-% IV SOLN
INTRAVENOUS | Status: DC | PRN
  Administered 2023-01-09: 40 ug/min via INTRAVENOUS

## 2023-01-09 MED ORDER — ONDANSETRON HCL 4 MG/2ML IJ SOLN: 4.0000 mg | Freq: Four times a day (QID) | INTRAMUSCULAR | Status: DC | PRN

## 2023-01-09 MED ORDER — ROCURONIUM BROMIDE 10 MG/ML (PF) SYRINGE
PREFILLED_SYRINGE | INTRAVENOUS | Status: DC | PRN
  Administered 2023-01-09: 70 mg via INTRAVENOUS

## 2023-01-09 MED ORDER — FENTANYL CITRATE (PF) 250 MCG/5ML IJ SOLN
INTRAMUSCULAR | Status: AC
  Filled 2023-01-09: qty 5

## 2023-01-09 MED ORDER — CEFAZOLIN SODIUM-DEXTROSE 2-4 GM/100ML-% IV SOLN
2.0000 g | INTRAVENOUS | Status: AC
  Administered 2023-01-09: 2 g via INTRAVENOUS

## 2023-01-09 MED ORDER — FENTANYL CITRATE (PF) 100 MCG/2ML IJ SOLN
25.0000 ug | INTRAMUSCULAR | Status: DC | PRN
  Administered 2023-01-09: 50 ug via INTRAVENOUS

## 2023-01-09 SURGICAL SUPPLY — 48 items
ADH SKN CLS APL DERMABOND .7 (GAUZE/BANDAGES/DRESSINGS) ×1
APL SKNCLS STERI-STRIP NONHPOA (GAUZE/BANDAGES/DRESSINGS) ×1
BAG COUNTER SPONGE SURGICOUNT (BAG) ×1 IMPLANT
BAG DECANTER FOR FLEXI CONT (MISCELLANEOUS) ×1 IMPLANT
BAG SPNG CNTER NS LX DISP (BAG) ×1
BENZOIN TINCTURE PRP APPL 2/3 (GAUZE/BANDAGES/DRESSINGS) ×1 IMPLANT
BLADE CLIPPER SURG (BLADE) IMPLANT
BUR CUTTER 7.0 ROUND (BURR) ×1 IMPLANT
CANISTER SUCT 3000ML PPV (MISCELLANEOUS) ×1 IMPLANT
DERMABOND ADVANCED .7 DNX12 (GAUZE/BANDAGES/DRESSINGS) ×1 IMPLANT
DRAPE C-ARM 42X72 X-RAY (DRAPES) IMPLANT
DRAPE HALF SHEET 40X57 (DRAPES) IMPLANT
DRAPE LAPAROTOMY 100X72X124 (DRAPES) ×1 IMPLANT
DRAPE MICROSCOPE SLANT 54X150 (MISCELLANEOUS) ×1 IMPLANT
DRAPE SURG 17X23 STRL (DRAPES) ×2 IMPLANT
DRSG OPSITE POSTOP 4X6 (GAUZE/BANDAGES/DRESSINGS) IMPLANT
DURAPREP 26ML APPLICATOR (WOUND CARE) ×1 IMPLANT
ELECT REM PT RETURN 9FT ADLT (ELECTROSURGICAL) ×1
ELECTRODE REM PT RTRN 9FT ADLT (ELECTROSURGICAL) ×1 IMPLANT
GAUZE 4X4 16PLY ~~LOC~~+RFID DBL (SPONGE) IMPLANT
GAUZE SPONGE 4X4 12PLY STRL (GAUZE/BANDAGES/DRESSINGS) ×1 IMPLANT
GLOVE BIO SURGEON STRL SZ 6.5 (GLOVE) ×1 IMPLANT
GLOVE BIOGEL PI IND STRL 6.5 (GLOVE) ×1 IMPLANT
GLOVE ECLIPSE 9.0 STRL (GLOVE) ×1 IMPLANT
GLOVE EXAM NITRILE XL STR (GLOVE) IMPLANT
GOWN STRL REUS W/ TWL LRG LVL3 (GOWN DISPOSABLE) IMPLANT
GOWN STRL REUS W/ TWL XL LVL3 (GOWN DISPOSABLE) ×1 IMPLANT
GOWN STRL REUS W/TWL 2XL LVL3 (GOWN DISPOSABLE) IMPLANT
GOWN STRL REUS W/TWL LRG LVL3 (GOWN DISPOSABLE)
GOWN STRL REUS W/TWL XL LVL3 (GOWN DISPOSABLE) ×1
KIT BASIN OR (CUSTOM PROCEDURE TRAY) ×1 IMPLANT
KIT TURNOVER KIT B (KITS) ×1 IMPLANT
NDL SPNL 22GX3.5 QUINCKE BK (NEEDLE) IMPLANT
NEEDLE HYPO 22GX1.5 SAFETY (NEEDLE) ×1 IMPLANT
NEEDLE SPNL 22GX3.5 QUINCKE BK (NEEDLE) IMPLANT
NS IRRIG 1000ML POUR BTL (IV SOLUTION) ×1 IMPLANT
PACK LAMINECTOMY NEURO (CUSTOM PROCEDURE TRAY) ×1 IMPLANT
PAD ARMBOARD 7.5X6 YLW CONV (MISCELLANEOUS) ×3 IMPLANT
SOL ELECTROSURG ANTI STICK (MISCELLANEOUS) ×1
SOLUTION ELECTROSURG ANTI STCK (MISCELLANEOUS) ×1 IMPLANT
SPIKE FLUID TRANSFER (MISCELLANEOUS) ×1 IMPLANT
SPONGE SURGIFOAM ABS GEL SZ50 (HEMOSTASIS) ×1 IMPLANT
STRIP CLOSURE SKIN 1/2X4 (GAUZE/BANDAGES/DRESSINGS) ×1 IMPLANT
SUT VIC AB 2-0 CT1 18 (SUTURE) ×1 IMPLANT
SUT VIC AB 3-0 SH 8-18 (SUTURE) ×1 IMPLANT
TOWEL GREEN STERILE (TOWEL DISPOSABLE) ×1 IMPLANT
TOWEL GREEN STERILE FF (TOWEL DISPOSABLE) ×1 IMPLANT
WATER STERILE IRR 1000ML POUR (IV SOLUTION) ×1 IMPLANT

## 2023-01-09 NOTE — Brief Op Note (Signed)
01/09/2023  5:42 PM  PATIENT:  Julian Harris  82 y.o. male  PRE-OPERATIVE DIAGNOSIS:  Synovial cyst  POST-OPERATIVE DIAGNOSIS:  Synovial cyst  PROCEDURE:  Procedure(s) with comments: Laminectomy for Facet/Synovial Cyst Bilateral Lumbar Four-Lumbar Five (Bilateral) - 3C  SURGEON:  Surgeon(s) and Role:    * Julio Sicks, MD - Primary  PHYSICIAN ASSISTANT:   ASSISTANTSMarland Mcalpine   ANESTHESIA:   general  EBL:  75 mL   BLOOD ADMINISTERED:none  DRAINS: none   LOCAL MEDICATIONS USED:  MARCAINE     SPECIMEN:  No Specimen  DISPOSITION OF SPECIMEN:  N/A  COUNTS:  YES  TOURNIQUET:  * No tourniquets in log *  DICTATION: .Dragon Dictation  PLAN OF CARE: Admit for overnight observation  PATIENT DISPOSITION:  PACU - hemodynamically stable.   Delay start of Pharmacological VTE agent (>24hrs) due to surgical blood loss or risk of bleeding: yes

## 2023-01-09 NOTE — Anesthesia Procedure Notes (Signed)
Procedure Name: Intubation Date/Time: 01/09/2023 4:16 PM  Performed by: Sharyn Dross, CRNAPre-anesthesia Checklist: Patient identified, Suction available, Emergency Drugs available and Patient being monitored Patient Re-evaluated:Patient Re-evaluated prior to induction Oxygen Delivery Method: Circle system utilized Preoxygenation: Pre-oxygenation with 100% oxygen Induction Type: IV induction Ventilation: Mask ventilation without difficulty Laryngoscope Size: Mac and 4 Grade View: Grade I Tube type: Oral Tube size: 7.5 mm Number of attempts: 1 Airway Equipment and Method: Stylet and Oral airway Placement Confirmation: ETT inserted through vocal cords under direct vision, positive ETCO2 and breath sounds checked- equal and bilateral Secured at: 23 cm Tube secured with: Tape Dental Injury: Teeth and Oropharynx as per pre-operative assessment

## 2023-01-09 NOTE — Transfer of Care (Signed)
Immediate Anesthesia Transfer of Care Note  Patient: Julian Harris  Procedure(s) Performed: Laminectomy for Facet/Synovial Cyst Bilateral Lumbar Four-Lumbar Five (Bilateral: Back)  Patient Location: PACU  Anesthesia Type:General  Level of Consciousness: awake, alert , and oriented  Airway & Oxygen Therapy: Patient Spontanous Breathing  Post-op Assessment: Report given to RN and Post -op Vital signs reviewed and stable  Post vital signs: Reviewed and stable  Last Vitals:  Vitals Value Taken Time  BP 126/90 01/09/23 1800  Temp    Pulse 62 01/09/23 1802  Resp 13 01/09/23 1802  SpO2 100 % 01/09/23 1802  Vitals shown include unvalidated device data.  Last Pain:  Vitals:   01/09/23 1335  TempSrc:   PainSc: 1          Complications: There were no known notable events for this encounter.

## 2023-01-09 NOTE — H&P (Signed)
Julian Harris is an 82 y.o. male.   Chief Complaint: Back pain HPI: 82 year old male with back and bilateral lower extremity symptoms consistent with neurogenic claudication which is failed conservative management.  Patient has significant lumbar stenosis with associated synovial cyst formation at L4-5 with marked compression of thecal sac and exiting nerve roots.  Patient has failed conservative management presents now for lumbar decompressive surgery in hopes of improving her symptoms.  Past Medical History:  Diagnosis Date   Adrenal insufficiency (HCC)    Hearing loss    bilateral - wears hearing aids   Hypertension    no meds, no current prolblems   Hypothyroidism    Macroadenoma    Pituitary mass (HCC) 2020   Prediabetes     Past Surgical History:  Procedure Laterality Date   BILATERAL KNEE ARTHROSCOPY  07/31/2008   COLONOSCOPY     CYST REMOVAL TRUNK  07/31/2008   HERNIA REPAIR     in 1970s   JOINT REPLACEMENT     bilateral knee replacements   PELVIC FRACTURE SURGERY     with screws and plates    Family History  Problem Relation Age of Onset   Heart disease Father        coronary stents, valve replacement   Social History:  reports that he quit smoking about 49 years ago. His smoking use included cigarettes. He has a 7.50 pack-year smoking history. He has never used smokeless tobacco. He reports that he does not currently use alcohol. He reports that he does not use drugs.  Allergies:  Allergies  Allergen Reactions   Trazodone     Other reaction(s): too sedating    Medications Prior to Admission  Medication Sig Dispense Refill   finasteride (PROSCAR) 5 MG tablet Take 5 mg by mouth daily.     hydrocortisone (CORTEF) 10 MG tablet Take 5-20 mg by mouth See admin instructions. Take 20 mg by mouth in the morning and take 5 mg in the evening.     levothyroxine (SYNTHROID) 75 MCG tablet Take 1 tablet (75 mcg total) by mouth daily at 6 (six) AM. 30 tablet 2    rOPINIRole (REQUIP) 1 MG tablet Take 1 mg by mouth at bedtime.     tamsulosin (FLOMAX) 0.4 MG CAPS capsule Take 0.4 mg by mouth daily after supper.     sulfamethoxazole-trimethoprim (BACTRIM DS) 800-160 MG tablet Take 1 tablet by mouth 2 (two) times daily. (Patient not taking: Reported on 01/02/2023) 6 tablet 0    Results for orders placed or performed during the hospital encounter of 01/09/23 (from the past 48 hour(s))  Type and screen [facility]     Status: None   Collection Time: 01/09/23  1:43 PM  Result Value Ref Range   ABO/RH(D) O NEG    Antibody Screen NEG    Sample Expiration      01/12/2023,2359 Performed at [facility], [address], [place], [place] [address]    No results found.  Review of systems not obtained due to patient factors.  Blood pressure 120/73, pulse 60, temperature 98 F (36.7 C), temperature source Oral, resp. rate 18, height 5\' 9"  (1.753 m), weight 97 kg, SpO2 97 %.  Patient is awake and alert.  He is oriented and appropriate.  Speech is fluent.  Judgment insight are intact.  Cranial nerve function normal bilateral.  Motor examination reveals intact motor strength bilaterally sensory examination with some patchy distal sensory loss in both lower  extremities.  Reflexes are hypoactive but symmetric.  No evidence of long track signs.  Gait antalgic.  Posture moderately flexed.  Social head ears eyes nose throat is unremarked.  Chest and abdomen are benign.  Extremities are free of major deformity. Assessment/Plan Lumbar stenosis with neurogenic claudication with associated L4-5 synovial cyst.  Plan bilateral L4-5 decompressive laminotomies with resection of synovial cyst.  Risks and benefits been explained.  Patient wishes to proceed.  Sherilyn Cooter A Kentrel Clevenger 01/09/2023, 3:51 PM

## 2023-01-09 NOTE — Op Note (Signed)
Date of procedure: 01/09/2023  Date of dictation: Same  Service: Neurosurgery  Preoperative diagnosis: L4-5 stenosis with right L4-5 adherent synovial cyst  Postoperative diagnosis: Same  Procedure Name: Bilateral L4-5 decompressive laminotomies with resection of adherent synovial cyst, microdissection  Surgeon:Larwence Tu A.Airrion Otting, M.D.  Asst. Surgeon: Doran Durand, NP  Anesthesia: General  Indication: 82 year old male with back and bilateral lower extremity pain numbness and weakness consistent with neurogenic claudication and radiculopathy failing conservative management workup demonstrates evidence of severe lumbar stenosis at L4-5 secondary to spondylosis with an associated large right-sided L4-5 synovial cyst causing further compression.  Patient has failed conservative management presents now for lumbar decompressive surgery with resection of cyst.  Operative note: After induction of anesthesia, patient positioned prone on the Wilson frame and properly padded.  Lumbar region prepped and draped sterilely.  Incision made overlying L4-5.  Dissection performed bilaterally.  Retractor placed.  X-ray taken.  Level confirmed.  Decompressive laminotomies were then performed using the high-speed drill and Kerrison rongeurs bilaterally to remove the inferior aspect of the lamina of L4 the medial aspect the L4-5 facet joint and the superior rim of the L5 lamina.  Ligament flavum elevated and resected on the left.  Foraminotomies complete on the course the exiting left L4 and L5 nerve root without difficulty or complication.  Attention then placed to the right side.  The ligamentum flavum was elevated.  There was a moderately large adherent synovial cyst.  Microscope was brought in field used microdissection.  The cyst was circumferentially dissected free and gently peeled off the thecal sac and right L5 nerve root.  Foraminotomies then complete on the course of the exiting L5 and L4 nerve roots on the right side.   At this point a very thorough decompression of been achieved.  There was no evidence of injury to the thecal sac or nerve roots.  Wound is then irrigated with's saline.  Wound is then closed in layers with Vicryl sutures.  Steri-Strips and sterile dressing were applied.  No apparent complications.  Patient tolerated the procedure well and he returns to the recovery room postop.

## 2023-01-10 DIAGNOSIS — Z79899 Other long term (current) drug therapy: Secondary | ICD-10-CM | POA: Diagnosis not present

## 2023-01-10 DIAGNOSIS — Z87891 Personal history of nicotine dependence: Secondary | ICD-10-CM | POA: Diagnosis not present

## 2023-01-10 DIAGNOSIS — Z96653 Presence of artificial knee joint, bilateral: Secondary | ICD-10-CM | POA: Diagnosis not present

## 2023-01-10 DIAGNOSIS — E039 Hypothyroidism, unspecified: Secondary | ICD-10-CM | POA: Diagnosis not present

## 2023-01-10 DIAGNOSIS — I10 Essential (primary) hypertension: Secondary | ICD-10-CM | POA: Diagnosis not present

## 2023-01-10 DIAGNOSIS — M7138 Other bursal cyst, other site: Secondary | ICD-10-CM | POA: Diagnosis not present

## 2023-01-10 MED ORDER — HYDROCODONE-ACETAMINOPHEN 5-325 MG PO TABS: 1.0000 | ORAL_TABLET | ORAL | 0 refills | Status: DC | PRN

## 2023-01-10 MED FILL — Thrombin For Soln 5000 Unit: CUTANEOUS | Qty: 2 | Status: AC

## 2023-01-10 NOTE — Progress Notes (Signed)
Patient alert and oriented, mae's well, voiding adequate amount of urine, swallowing without difficulty, no c/o pain at time of discharge. Patient discharged home with family. Script and discharged instructions given to patient. Patient  stated understanding of instructions given. Patient has an appointment with Dr. Jordan Likes

## 2023-01-10 NOTE — Evaluation (Signed)
Occupational Therapy Evaluation Patient Details Name: Julian Harris MRN: [id number] DOB: 1941/02/25 Today's Date: 01/10/2023   History of Present Illness 82 year old male presenting with back and bilateral lower extremity pain numbness and weakness. Workup demonstrates evidence of severe lumbar stenosis at L4-5 secondary to spondylosis with an associated large right-sided L4-5 synovial cyst. Bilateral L4-5 decompressive laminotomies with resection of adherent synovial cyst performed 01/09/23. PMHx: adrenal insufficiency, pituitary macroadenoma, central hypothyroidism, adrenal insufficiency.   Clinical Impression   Pt evaluated s/p above admission list. Pt reports independence with ADL/IADLs, driving and mobility without use of AD at baseline. During session, pt provided with back precaution sheet and education with pt verbalizing understanding. Pt with minimal carryover of back precautions during mobility as he tended to twist back to converse. Pt currently setup A for seated UB ADLs and supervision for LB ADLs. Pt completed STS transfers and room/hallway level mobility without use of AD with supervision. Pt ascended/descended stairs using right hand rail with supervision and cues for safe technique. Pt does not require further OT services at this time.       Recommendations for follow up therapy are one component of a multi-disciplinary discharge planning process, led by the attending physician.  Recommendations may be updated based on patient status, additional functional criteria and insurance authorization.   Assistance Recommended at Discharge Intermittent Supervision/Assistance  Patient can return home with the following A little help with walking and/or transfers;A little help with bathing/dressing/bathroom;Assistance with cooking/housework;Assist for transportation;Help with stairs or ramp for entrance    Functional Status Assessment  Patient has had a recent decline in their functional  status and demonstrates the ability to make significant improvements in function in a reasonable and predictable amount of time.  Equipment Recommendations  None recommended by OT    Recommendations for Other Services       Precautions / Restrictions Precautions Precautions: Back Precaution Booklet Issued: Yes (comment) Precaution Comments: precaution sheet and education provided with pt verbalizing understanding, minimal carryover noted with mobility. Required Braces or Orthoses: Other Brace Other Brace: no brace required Restrictions Weight Bearing Restrictions: No      Mobility Bed Mobility               General bed mobility comments: not assessed, pt in bathroom upon arrival    Transfers Overall transfer level: Needs assistance Equipment used: None Transfers: Sit to/from Stand Sit to Stand: Supervision           General transfer comment: STS transfers from EOB with supervision for safety, room/hallway level mobility without AD with supervision for safety and cues for back precautions with minimal carryover noted. Pt ascended/descended stairs using R hand rail with supervision for safety and cues for technique.      Balance Overall balance assessment: Needs assistance Sitting-balance support: No upper extremity supported, Feet supported Sitting balance-Leahy Scale: Good Sitting balance - Comments: sitting eob   Standing balance support: No upper extremity supported, During functional activity Standing balance-Leahy Scale: Good                             ADL either performed or assessed with clinical judgement   ADL Overall ADL's : Needs assistance/impaired Eating/Feeding: Modified independent;Sitting   Grooming: Supervision/safety;Standing   Upper Body Bathing: Sitting;Set up   Lower Body Bathing: Supervison/ safety;Sit to/from stand   Upper Body Dressing : Set up;Sitting Upper Body Dressing Details (indicate cue type and reason):  doffed gown  and donned shirt while seated EOB with setup A Lower Body Dressing: Supervision/safety;Sit to/from stand Lower Body Dressing Details (indicate cue type and reason): doffed/donned bilat socks, donned pants while alternating sitting/standing EOB with supervision for safety, demonstrates figure four position with LB dressing Toilet Transfer: Supervision/safety;Ambulation;Regular Toilet   Toileting- Architect and Hygiene: Supervision/safety;Sit to/from stand       Functional mobility during ADLs: Supervision/safety General ADL Comments: Generalized supervision for safety during standing ADLs, cues for completing ADLs within back precautions     Vision Baseline Vision/History: 1 Wears glasses Ability to See in Adequate Light: 0 Adequate Vision Assessment?: No apparent visual deficits     Perception Perception Perception Tested?: No   Praxis Praxis Praxis tested?: Not tested    Pertinent Vitals/Pain Pain Assessment Pain Assessment: Faces Faces Pain Scale: Hurts a little bit Pain Location: back Pain Descriptors / Indicators: Sore Pain Intervention(s): Limited activity within patient's tolerance, Monitored during session     Hand Dominance Left   Extremity/Trunk Assessment Upper Extremity Assessment Upper Extremity Assessment: Overall WFL for tasks assessed   Lower Extremity Assessment Lower Extremity Assessment: Defer to PT evaluation   Cervical / Trunk Assessment Cervical / Trunk Assessment: Kyphotic   Communication Communication Communication: No difficulties   Cognition Arousal/Alertness: Awake/alert Behavior During Therapy: WFL for tasks assessed/performed Overall Cognitive Status: Within Functional Limits for tasks assessed                                 General Comments: Pt pleasant and cooperative, receptive to back precaution education, however minimal carryover noted with mobility     General Comments  VSS on RA     Exercises     Shoulder Instructions      Home Living Family/patient expects to be discharged to:: Private residence Living Arrangements: Alone Available Help at Discharge: Family;Available 24 hours/day Type of Home: House Home Access: Stairs to enter [employer] of Steps: 5 Entrance Stairs-Rails: Right Home Layout: Able to live on main level with bedroom/bathroom;Laundry or work area in basement     [employer] Shower/Tub: Producer, television/film/video: Standard     Home Equipment: Agricultural consultant (2 wheels)   Additional Comments: Granddaughter available to assist as needed      Prior Functioning/Environment Prior Level of Function : Independent/Modified Independent;Driving             Mobility Comments: independent without use of AD ADLs Comments: Independent with ADL/IADLs and driving without use of AD.        OT Problem List: Decreased strength;Decreased range of motion;Decreased activity tolerance;Impaired balance (sitting and/or standing);Decreased knowledge of precautions;Pain      OT Treatment/Interventions:      OT Goals(Current goals can be found in the care plan section) Acute Rehab OT Goals Patient Stated Goal: to go home OT Goal Formulation: With patient Time For Goal Achievement: 01/24/23 Potential to Achieve Goals: Good  OT Frequency:      Co-evaluation              AM-PAC OT "6 Clicks" Daily Activity     Outcome Measure Help from another person eating meals?: None Help from another person taking care of personal grooming?: A Little Help from another person toileting, which includes using toliet, bedpan, or urinal?: A Little Help from another person bathing (including washing, rinsing, drying)?: A Little Help from another person to put on and taking off regular upper  body clothing?: A Little Help from another person to put on and taking off regular lower body clothing?: A Little 6 Click Score: 19   End of Session Equipment  Utilized During Treatment: Other (comment) (none) Nurse Communication: Mobility status  Activity Tolerance: Patient tolerated treatment well Patient left: in bed;with call bell/phone within reach  OT Visit Diagnosis: Unsteadiness on feet (R26.81);Pain                Time: 4098-1191 OT Time Calculation (min): 20 min Charges:  OT General Charges $OT Visit: 1 Visit OT Evaluation $OT Eval Low Complexity: 1 Low  Sherley Bounds, OTS Acute Rehabilitation Services Office [phone] Secure Chat Communication Preferred   Sherley Bounds 01/10/2023, 10:07 AM

## 2023-01-10 NOTE — Discharge Instructions (Addendum)
Wound Care Keep incision covered and dry for three days.   Do not put any creams, lotions, or ointments on incision. Leave steri-strips on back.  They will fall off by themselves. Activity Walk each and every day, increasing distance each day. No lifting greater than 5 lbs.  Avoid excessive neck motion. No driving for 2 weeks; may ride as a passenger locally.   Diet Resume your normal diet.   Call Your Doctor If Any of These Occur Redness, drainage, or swelling at the wound.  Temperature greater than 101 degrees. Severe pain not relieved by pain medication. Incision starts to come apart. Follow Up Appt Call  [phone]) or problems.  If you have any hardware placed in your spine, you will need an x-ray before your appointment.

## 2023-01-10 NOTE — Discharge Summary (Signed)
Physician Discharge Summary  Patient ID: Julian Harris MRN: [id number] DOB/AGE: 1940-12-14 82 y.o.  Admit date: 01/09/2023 Discharge date: 01/10/2023  Admission Diagnoses:  Discharge Diagnoses:  Principal Problem:   Synovial cyst of lumbar facet joint   Discharged Condition: good  Hospital Course: Patient admitted to the hospital where he underwent uncomplicated bilateral L4-5 decompressive laminotomies with resection of right-sided L4-5 synovial cyst.  Postoperatively doing very well.  Standing ambulating and voiding without difficulty.  Preoperative symptoms resolved.  Patient feels good and ready for discharge home.  Consults:   Significant Diagnostic Studies:   Treatments:   Discharge Exam: Blood pressure (!) 140/95, pulse 67, temperature 97.7 F (36.5 C), temperature source Oral, resp. rate 16, height 5\' 9"  (1.753 m), weight 97 kg, SpO2 99 %. Awake and alert.  Oriented and appropriate.  Motor and sensory function intact.  Wound clean and dry.  Chest and abdomen benign.  Disposition: Discharge disposition: 01-Home or Self Care        Allergies as of 01/10/2023       Reactions   Trazodone    Other reaction(s): too sedating        Medication List     TAKE these medications    finasteride 5 MG tablet Commonly known as: PROSCAR Take 5 mg by mouth daily.   HYDROcodone-acetaminophen 5-325 MG tablet Commonly known as: NORCO/VICODIN Take 1 tablet by mouth every 4 (four) hours as needed for moderate pain ((score 4 to 6)).   hydrocortisone 10 MG tablet Commonly known as: CORTEF Take 5-20 mg by mouth See admin instructions. Take 20 mg by mouth in the morning and take 5 mg in the evening.   levothyroxine 75 MCG tablet Commonly known as: SYNTHROID Take 1 tablet (75 mcg total) by mouth daily at 6 (six) AM.   rOPINIRole 1 MG tablet Commonly known as: REQUIP Take 1 mg by mouth at bedtime.   sulfamethoxazole-trimethoprim 800-160 MG tablet Commonly known as:  BACTRIM DS Take 1 tablet by mouth 2 (two) times daily.   tamsulosin 0.4 MG Caps capsule Commonly known as: FLOMAX Take 0.4 mg by mouth daily after supper.         Signed: Kathaleen Maser Kire Ferg 01/10/2023, 8:16 AM

## 2023-01-11 ENCOUNTER — Encounter (HOSPITAL_COMMUNITY): Payer: Self-pay | Admitting: Neurosurgery

## 2023-01-11 LAB — TYPE AND SCREEN: Donor AG Type: NEGATIVE

## 2023-01-11 LAB — BPAM RBC: Blood Product Expiration Date: 202407142359

## 2023-01-12 NOTE — Anesthesia Postprocedure Evaluation (Signed)
Anesthesia Post Note  Patient: Julian Harris  Procedure(s) Performed: Laminectomy for Facet/Synovial Cyst Bilateral Lumbar Four-Lumbar Five (Bilateral: Back)     Patient location during evaluation: PACU Anesthesia Type: General Level of consciousness: awake and alert Pain management: pain level controlled Vital Signs Assessment: post-procedure vital signs reviewed and stable Respiratory status: spontaneous breathing, nonlabored ventilation, respiratory function stable and patient connected to nasal cannula oxygen Cardiovascular status: blood pressure returned to baseline and stable Postop Assessment: no apparent nausea or vomiting Anesthetic complications: no   There were no known notable events for this encounter.            Shelton Silvas

## 2023-01-17 DIAGNOSIS — Z79899 Other long term (current) drug therapy: Secondary | ICD-10-CM | POA: Diagnosis not present

## 2023-01-17 DIAGNOSIS — Z09 Encounter for follow-up examination after completed treatment for conditions other than malignant neoplasm: Secondary | ICD-10-CM | POA: Diagnosis not present

## 2023-01-17 DIAGNOSIS — I10 Essential (primary) hypertension: Secondary | ICD-10-CM | POA: Diagnosis not present

## 2023-01-17 DIAGNOSIS — Z13220 Encounter for screening for lipoid disorders: Secondary | ICD-10-CM | POA: Diagnosis not present

## 2023-01-17 DIAGNOSIS — Z862 Personal history of diseases of the blood and blood-forming organs and certain disorders involving the immune mechanism: Secondary | ICD-10-CM | POA: Diagnosis not present

## 2023-02-21 DIAGNOSIS — H04123 Dry eye syndrome of bilateral lacrimal glands: Secondary | ICD-10-CM | POA: Diagnosis not present

## 2023-02-21 DIAGNOSIS — H02135 Senile ectropion of left lower eyelid: Secondary | ICD-10-CM | POA: Diagnosis not present

## 2023-02-21 DIAGNOSIS — H25813 Combined forms of age-related cataract, bilateral: Secondary | ICD-10-CM | POA: Diagnosis not present

## 2023-02-21 DIAGNOSIS — H35362 Drusen (degenerative) of macula, left eye: Secondary | ICD-10-CM | POA: Diagnosis not present

## 2023-04-03 DIAGNOSIS — H02535 Eyelid retraction left lower eyelid: Secondary | ICD-10-CM | POA: Diagnosis not present

## 2023-04-03 DIAGNOSIS — H16212 Exposure keratoconjunctivitis, left eye: Secondary | ICD-10-CM | POA: Diagnosis not present

## 2023-04-03 DIAGNOSIS — H04522 Eversion of left lacrimal punctum: Secondary | ICD-10-CM | POA: Diagnosis not present

## 2023-04-03 DIAGNOSIS — H0279 Other degenerative disorders of eyelid and periocular area: Secondary | ICD-10-CM | POA: Diagnosis not present

## 2023-04-03 DIAGNOSIS — H02413 Mechanical ptosis of bilateral eyelids: Secondary | ICD-10-CM | POA: Diagnosis not present

## 2023-04-03 DIAGNOSIS — H0234 Blepharochalasis left upper eyelid: Secondary | ICD-10-CM | POA: Diagnosis not present

## 2023-04-03 DIAGNOSIS — H0231 Blepharochalasis right upper eyelid: Secondary | ICD-10-CM | POA: Diagnosis not present

## 2023-04-03 DIAGNOSIS — H0232 Blepharochalasis right lower eyelid: Secondary | ICD-10-CM | POA: Diagnosis not present

## 2023-04-03 DIAGNOSIS — H01009 Unspecified blepharitis unspecified eye, unspecified eyelid: Secondary | ICD-10-CM | POA: Diagnosis not present

## 2023-04-03 DIAGNOSIS — H02135 Senile ectropion of left lower eyelid: Secondary | ICD-10-CM | POA: Diagnosis not present

## 2023-04-03 DIAGNOSIS — H04123 Dry eye syndrome of bilateral lacrimal glands: Secondary | ICD-10-CM | POA: Diagnosis not present

## 2023-04-03 DIAGNOSIS — H02115 Cicatricial ectropion of left lower eyelid: Secondary | ICD-10-CM | POA: Diagnosis not present

## 2023-04-23 DIAGNOSIS — H16212 Exposure keratoconjunctivitis, left eye: Secondary | ICD-10-CM | POA: Diagnosis not present

## 2023-04-23 DIAGNOSIS — H0232 Blepharochalasis right lower eyelid: Secondary | ICD-10-CM | POA: Diagnosis not present

## 2023-04-23 DIAGNOSIS — H0231 Blepharochalasis right upper eyelid: Secondary | ICD-10-CM | POA: Diagnosis not present

## 2023-04-23 DIAGNOSIS — H02125 Mechanical ectropion of left lower eyelid: Secondary | ICD-10-CM | POA: Diagnosis not present

## 2023-04-23 DIAGNOSIS — H01009 Unspecified blepharitis unspecified eye, unspecified eyelid: Secondary | ICD-10-CM | POA: Diagnosis not present

## 2023-04-23 DIAGNOSIS — H02115 Cicatricial ectropion of left lower eyelid: Secondary | ICD-10-CM | POA: Diagnosis not present

## 2023-04-23 DIAGNOSIS — H02135 Senile ectropion of left lower eyelid: Secondary | ICD-10-CM | POA: Diagnosis not present

## 2023-04-23 DIAGNOSIS — R253 Fasciculation: Secondary | ICD-10-CM | POA: Diagnosis not present

## 2023-04-23 DIAGNOSIS — H0234 Blepharochalasis left upper eyelid: Secondary | ICD-10-CM | POA: Diagnosis not present

## 2023-04-23 DIAGNOSIS — H02413 Mechanical ptosis of bilateral eyelids: Secondary | ICD-10-CM | POA: Diagnosis not present

## 2023-04-23 DIAGNOSIS — H04123 Dry eye syndrome of bilateral lacrimal glands: Secondary | ICD-10-CM | POA: Diagnosis not present

## 2023-04-23 DIAGNOSIS — H04562 Stenosis of left lacrimal punctum: Secondary | ICD-10-CM | POA: Diagnosis not present

## 2023-04-23 DIAGNOSIS — M6248 Contracture of muscle, other site: Secondary | ICD-10-CM | POA: Diagnosis not present

## 2023-04-23 DIAGNOSIS — H0235 Blepharochalasis left lower eyelid: Secondary | ICD-10-CM | POA: Diagnosis not present

## 2023-04-23 DIAGNOSIS — H04522 Eversion of left lacrimal punctum: Secondary | ICD-10-CM | POA: Diagnosis not present

## 2023-04-23 DIAGNOSIS — H11822 Conjunctivochalasis, left eye: Secondary | ICD-10-CM | POA: Diagnosis not present

## 2023-04-23 DIAGNOSIS — H02535 Eyelid retraction left lower eyelid: Secondary | ICD-10-CM | POA: Diagnosis not present

## 2023-04-25 ENCOUNTER — Ambulatory Visit: Payer: Medicare Other | Admitting: Cardiology

## 2023-07-02 DIAGNOSIS — E2740 Unspecified adrenocortical insufficiency: Secondary | ICD-10-CM | POA: Diagnosis not present

## 2023-07-02 DIAGNOSIS — Z125 Encounter for screening for malignant neoplasm of prostate: Secondary | ICD-10-CM | POA: Diagnosis not present

## 2023-07-02 DIAGNOSIS — Z13220 Encounter for screening for lipoid disorders: Secondary | ICD-10-CM | POA: Diagnosis not present

## 2023-07-02 DIAGNOSIS — I10 Essential (primary) hypertension: Secondary | ICD-10-CM | POA: Diagnosis not present

## 2023-07-02 DIAGNOSIS — Z79899 Other long term (current) drug therapy: Secondary | ICD-10-CM | POA: Diagnosis not present

## 2023-07-02 DIAGNOSIS — Z862 Personal history of diseases of the blood and blood-forming organs and certain disorders involving the immune mechanism: Secondary | ICD-10-CM | POA: Diagnosis not present

## 2023-07-09 DIAGNOSIS — E230 Hypopituitarism: Secondary | ICD-10-CM | POA: Diagnosis not present

## 2023-07-09 DIAGNOSIS — E038 Other specified hypothyroidism: Secondary | ICD-10-CM | POA: Diagnosis not present

## 2023-07-09 DIAGNOSIS — E2740 Unspecified adrenocortical insufficiency: Secondary | ICD-10-CM | POA: Diagnosis not present

## 2023-07-09 DIAGNOSIS — D352 Benign neoplasm of pituitary gland: Secondary | ICD-10-CM | POA: Diagnosis not present

## 2023-07-12 DIAGNOSIS — D352 Benign neoplasm of pituitary gland: Secondary | ICD-10-CM | POA: Diagnosis not present

## 2023-07-12 DIAGNOSIS — E230 Hypopituitarism: Secondary | ICD-10-CM | POA: Diagnosis not present

## 2023-07-12 DIAGNOSIS — Z6833 Body mass index (BMI) 33.0-33.9, adult: Secondary | ICD-10-CM | POA: Diagnosis not present

## 2023-07-12 DIAGNOSIS — Z0000 Encounter for general adult medical examination without abnormal findings: Secondary | ICD-10-CM | POA: Diagnosis not present

## 2023-07-12 DIAGNOSIS — E038 Other specified hypothyroidism: Secondary | ICD-10-CM | POA: Diagnosis not present

## 2023-07-17 IMAGING — CT CT ABD-PELV W/ CM
2 of 5 series · 16 of 46 positions shown, 18 images · IV contrast (omnipaque)
Comparison: Ultrasound May 13, 2014

CLINICAL DATA: Chronic intermittent abdominal pain cramping and
bloating.

EXAM:
CT ABDOMEN AND PELVIS WITH CONTRAST
TECHNIQUE: Multidetector CT imaging of the abdomen and pelvis was performed
using the standard protocol following bolus administration of
intravenous contrast.
CONTRAST:  80mL OMNIPAQUE IOHEXOL 350 MG/ML SOLN

[Series 3: axial st · axial · 0.89mm/px · z∈[-806,-386]mm · 13 of 100 slices shown, 15 images]
[im 8/100  soft-tissue]
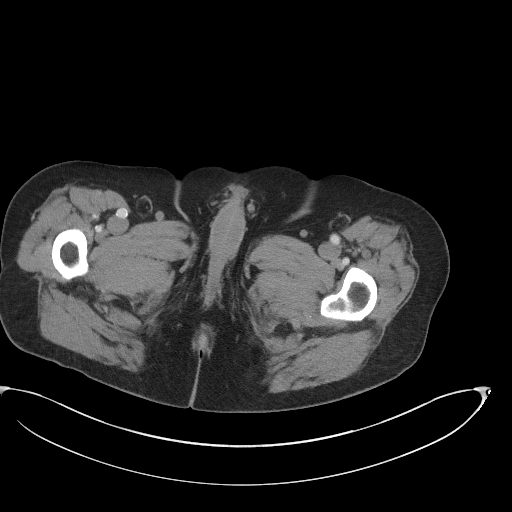
[im 8/100  bone]
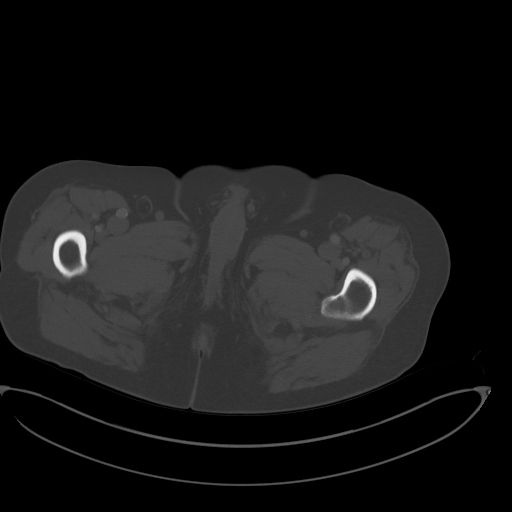
[im 15/100  soft-tissue]
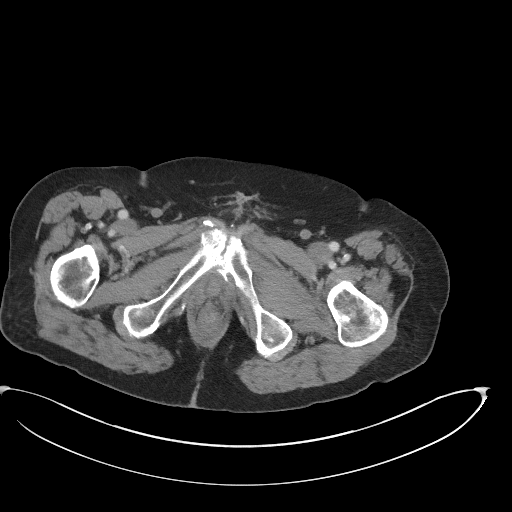
[im 22/100  soft-tissue]
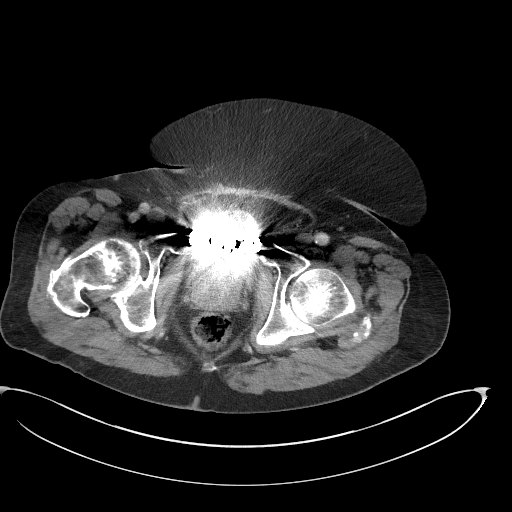
[im 29/100  soft-tissue]
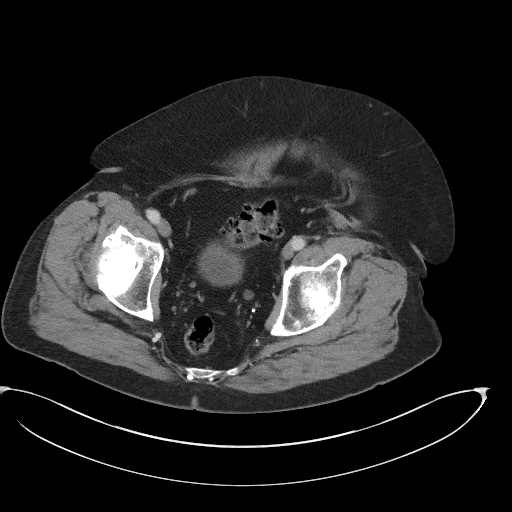
[im 36/100  soft-tissue]
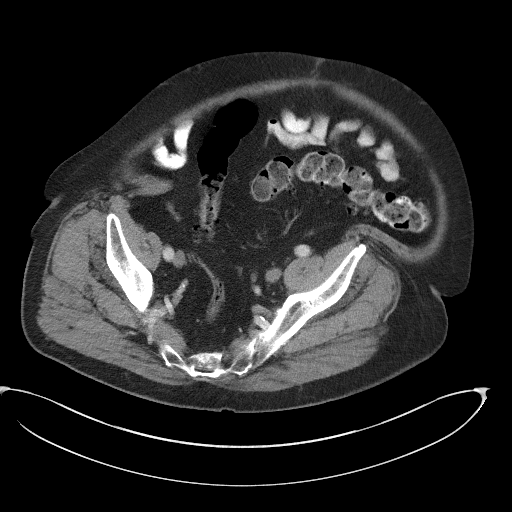
[im 43/100  soft-tissue]
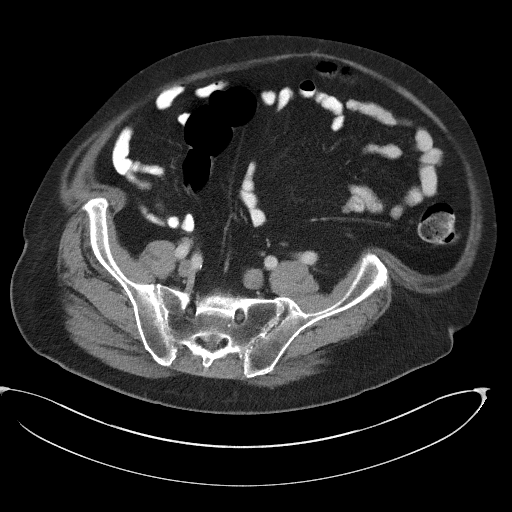
[im 50/100  soft-tissue]
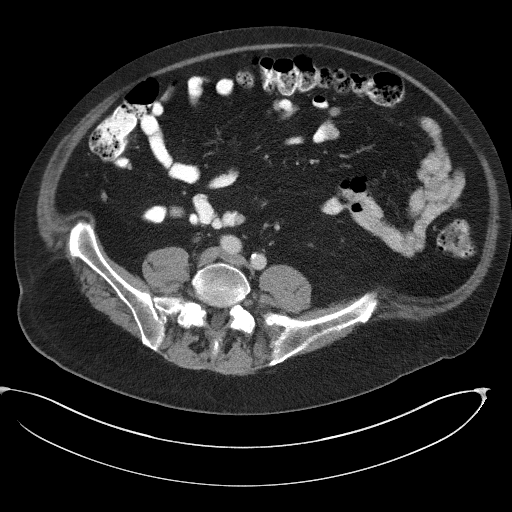
[im 57/100  soft-tissue]
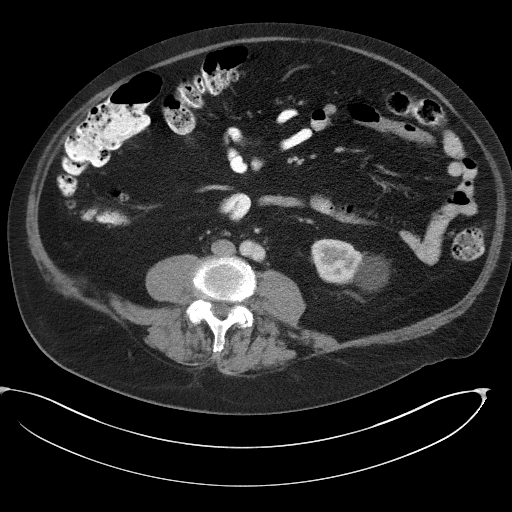
[im 64/100  soft-tissue]
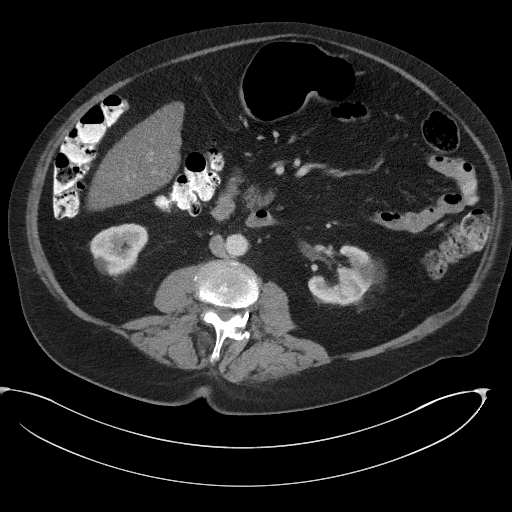
[im 64/100  bone]
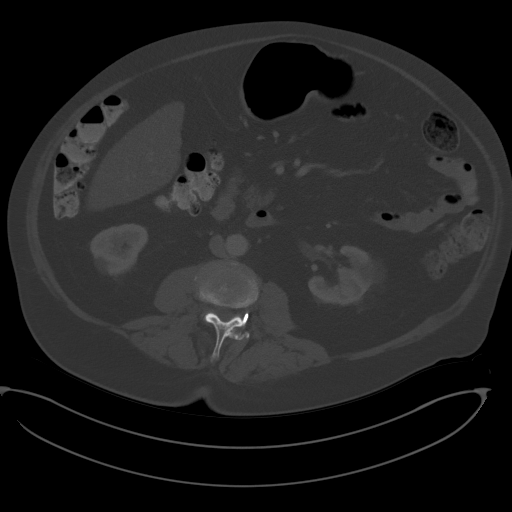
[im 71/100  soft-tissue]
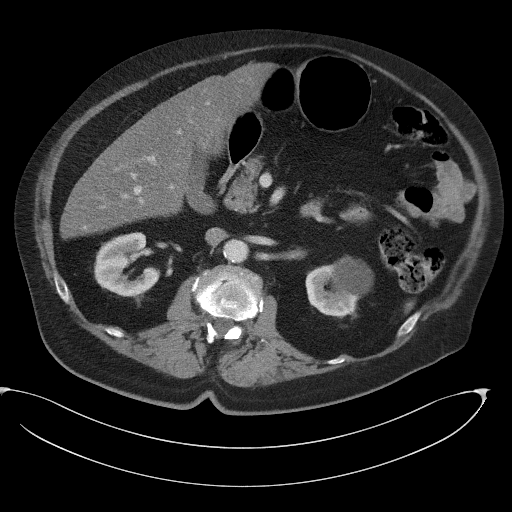
[im 78/100  soft-tissue]
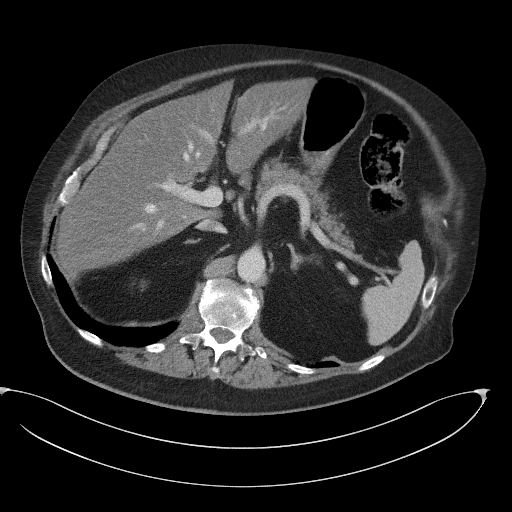
[im 85/100  soft-tissue]
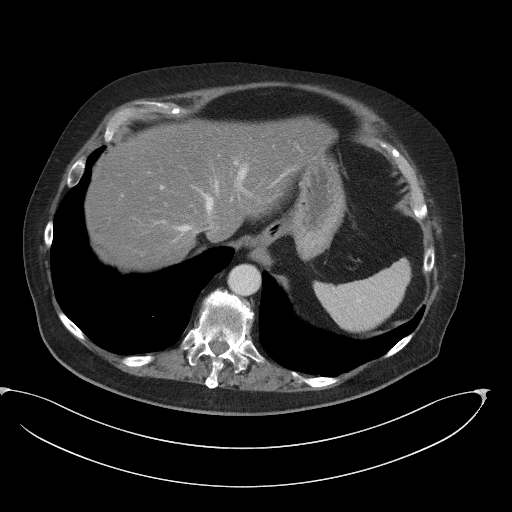
[im 92/100  soft-tissue]
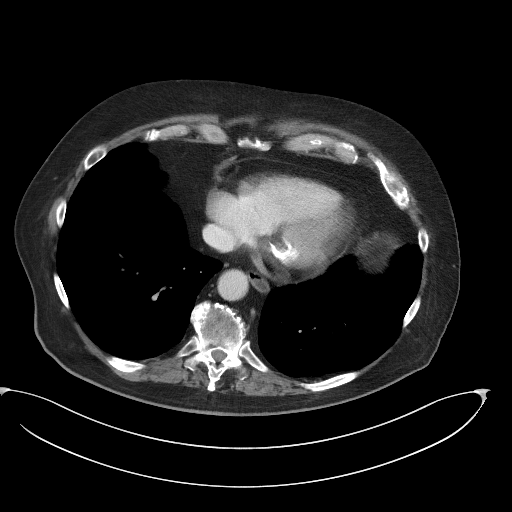

[Series 5: coronal st · coronal · 0.96mm/px · 3 of 124 slices shown]
[im 42/124  soft-tissue]
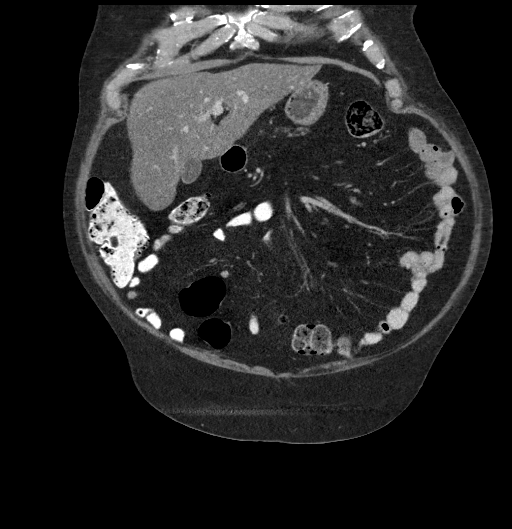
[im 55/124  soft-tissue]
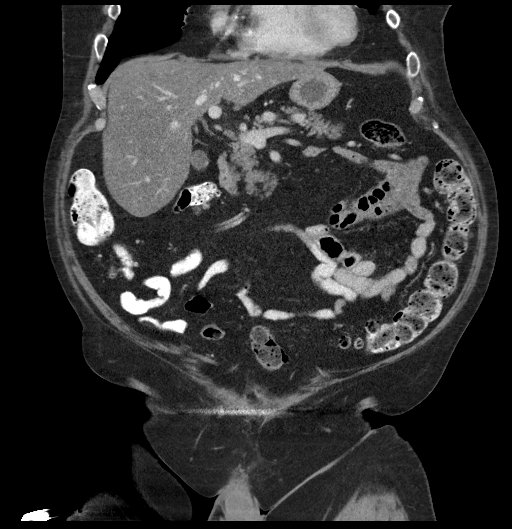
[im 69/124  soft-tissue]
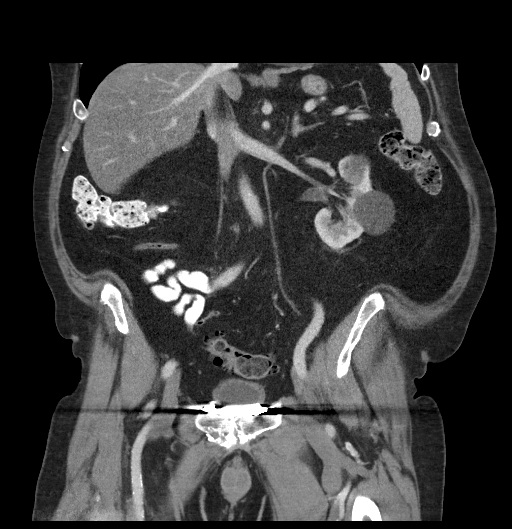

[16 of 46 positions shown; findings below may reference images not displayed]

FINDINGS: Lower chest: Solid 4 mm left lower lobe pulmonary nodule on image
44/4. Mitral annular calcifications.

Hepatobiliary: Diffuse hepatic steatosis. Gallbladder is
unremarkable. No biliary ductal dilation.

Pancreas: No pancreatic ductal dilation or evidence of acute
inflammation.

Spleen: Within normal limits.

Adrenals/Urinary Tract: Bilateral adrenal glands are unremarkable.
No hydronephrosis. Hypodense bilateral renal cysts measuring up to
4.3 cm on the left. No solid enhancing renal lesion. Urinary bladder
is effaced by enlarged prostate otherwise unremarkable for degree of
distension.

Stomach/Bowel: Radiopaque enteric contrast material traverses the
splenic flexure. Patulous lower esophagus. Stomach is unremarkable
for degree of distension. No pathologic dilation of small or large
bowel. The appendix and terminal ileum appear normal. Colonic
diverticulosis without findings of acute diverticulitis.

Vascular/Lymphatic: Aortic and branch vessel atherosclerosis without
abdominal aortic aneurysm. No pathologically enlarged abdominal or
pelvic lymph nodes.

Reproductive: Enlarged prostate.

Other: No significant abdominopelvic free fluid. No
pneumoperitoneum.

Musculoskeletal: Plate and screw fixation of the pubic symphysis.
Multilevel degenerative changes spine. Degenerative change and
partial bony ankylosis of the SI joints. No acute osseous
abnormality.
IMPRESSION: 1. No acute findings in the abdomen or pelvis.
2. Colonic diverticulosis without findings of acute diverticulitis.
3. Diffuse hepatic steatosis.
4. Solid 4 mm left lower lobe pulmonary nodule. No routine follow-up
imaging is recommended per [HOSPITAL] Guidelines. These
guidelines do not apply to immunocompromised patients and patients
with cancer. Follow up in patients with significant comorbidities as
clinically warranted. For lung cancer screening, adhere to Lung-RADS
guidelines. Reference: Radiology. 0825; 284(1):228-43.
5.  Aortic Atherosclerosis ([id number]-PHN.N).

## 2023-08-14 ENCOUNTER — Encounter: Payer: Self-pay | Admitting: Physician Assistant

## 2023-08-14 ENCOUNTER — Ambulatory Visit: Payer: Medicare Other | Attending: Cardiology | Admitting: Physician Assistant

## 2023-08-14 VITALS — BP 124/80 | HR 79 | Ht 69.0 in | Wt 226.2 lb

## 2023-08-14 DIAGNOSIS — R0609 Other forms of dyspnea: Secondary | ICD-10-CM | POA: Diagnosis not present

## 2023-08-14 DIAGNOSIS — I6523 Occlusion and stenosis of bilateral carotid arteries: Secondary | ICD-10-CM | POA: Insufficient documentation

## 2023-08-14 NOTE — Patient Instructions (Signed)
 Medication Instructions:  Your physician recommends that you continue on your current medications as directed. Please refer to the Current Medication list given to you today.  *If you need a refill on your cardiac medications before your next appointment, please call your pharmacy*   Lab Work: None ordered  If you have labs (blood work) drawn today and your tests are completely normal, you will receive your results only by: MyChart Message (if you have MyChart) OR A paper copy in the mail If you have any lab test that is abnormal or we need to change your treatment, we will call you to review the results.   Testing/Procedures: Your physician has requested that you have a carotid duplex AT END OF APRIL WHEN WE MOVE TO NEW BUILDING. This test is an ultrasound of the carotid arteries in your neck. It looks at blood flow through these arteries that supply the brain with blood. Allow one hour for this exam. There are no restrictions or special instructions.    Follow-Up: At [facility], you and your health needs are our priority.  As part of our continuing mission to provide you with exceptional heart care, we have created designated Provider Care Teams.  These Care Teams include your primary Cardiologist (physician) and Advanced Practice Providers (APPs -  Physician Assistants and Nurse Practitioners) who all work together to provide you with the care you need, when you need it.  We recommend signing up for the patient portal called MyChart.  Sign up information is provided on this After Visit Summary.  MyChart is used to connect with patients for Virtual Visits (Telemedicine).  Patients are able to view lab/test results, encounter notes, upcoming appointments, etc.  Non-urgent messages can be sent to your provider as well.   To learn more about what you can do with MyChart, go to [email or web address].    Your next appointment:   12 month(s)  Provider:   Newman JINNY Lawrence,  MD     Other Instructions

## 2023-08-14 NOTE — Assessment & Plan Note (Signed)
 Chronic dyspnea on exertion. He has not had any change. He has no sign of volume excess on exam. TTE in 10/2022 with normal EF. He is not having chest pain. No further testing is needed at this time.

## 2023-08-14 NOTE — Progress Notes (Signed)
 Cardiology Office Note:    Date:  08/14/2023  ID:  Julian Harris, DOB [birth year], MRN [id number] PCP: Gerome Brunet, DO  [facility] Providers Cardiologist:  Newman JINNY Lawrence, MD       Patient Profile:      Adrenal insufficiency  Pituitary macroadenoma  Hypothyroidism  Orthostatic hypotension Aortic atherosclerosis  Shortness of breath  TTE 11/23/2022: Moderate LVH, EF 50-55, AV sclerosis, trace AI, trace MR, mild TR, PASP 33 Carotid US  10/08/2018: Bilateral ICA 1-39         History of Present Illness:  Discussed the use of AI scribe software for clinical note transcription with the patient, who gave verbal consent to proceed.  Julian Harris is a 83 y.o. male who returns for follow up. He was last seen by Dr. Lawrence in 09/2022 for dyspnea. Follow up TTE showed normal EF, mild TR, trace MR, trace AI and normal PASP. He is here with his friend. He reports overall stability in his health, with no new symptoms or concerns related to his heart condition. He is currently on prednisone  for adrenal insufficiency and reports no issues with blood pressure instability or syncope since starting the medication. The patient reports shortness of breath, which he attributes to a lack of exercise and carrying extra weight. He denies any worsening of his breathing and is able to ascend a flight of stairs without stopping, albeit sometimes needing to 'two-step' due to weakness in his left leg. This weakness is attributed to a previous bilateral knee replacement and a need for muscle strengthening. The patient denies any recent episodes of syncope, leg swelling, or difficulty breathing when lying flat. He also denies smoking.     Review of Systems  Gastrointestinal:  Negative for hematochezia and melena.  Genitourinary:  Negative for hematuria.  -See HPI    Studies Reviewed:   EKG Interpretation Date/Time:  Tuesday August 14 2023 13:28:04 EST Ventricular Rate:  79 PR  Interval:  174 QRS Duration:  64 QT Interval:  392 QTC Calculation: 449 R Axis:   -2  Text Interpretation: Sinus rhythm with Premature atrial complexes Anteroseptal infarct (cited on or before 14-Aug-2023) Low voltage QRS No significant change since last tracing Confirmed by Lelon Hamilton [id number]) on 08/14/2023 1:35:50 PM         Risk Assessment/Calculations:             Physical Exam:   VS:  BP 124/80   Pulse 79   Ht 5' 9 (1.753 m)   Wt 226 lb 3.2 oz (102.6 kg)   SpO2 97%   BMI 33.40 kg/m    Wt Readings from Last 3 Encounters:  08/14/23 226 lb 3.2 oz (102.6 kg)  01/09/23 213 lb 13.5 oz (97 kg)  01/04/23 213 lb 12.8 oz (97 kg)    Constitutional:      Appearance: Healthy appearance. Not in distress.  Neck:     Vascular: JVD normal.  Pulmonary:     Breath sounds: Normal breath sounds. No wheezing. No rales.  Cardiovascular:     Normal rate. Regular rhythm.     Murmurs: There is no murmur.  Edema:    Peripheral edema absent.  Abdominal:     Palpations: Abdomen is soft.         Assessment and Plan:   Assessment & Plan Exertional dyspnea Chronic dyspnea on exertion. He has not had any change. He has no sign of volume excess on exam. TTE in 10/2022 with normal  EF. He is not having chest pain. No further testing is needed at this time.   Bilateral carotid artery stenosis Carotid US  in 2020 with bilateral ICA 1-39% stenosis. Since it has been 5 years, will get follow up US . If stenosis worse, would recommend ASA, statin Rx. Otherwise, continue current Rx.        Dispo:  Return in about 1 year (around 08/13/2024) for Routine Follow Up, w/ Dr. Elmira.  Signed, Glendia Ferrier, PA-C

## 2023-10-15 DIAGNOSIS — H2513 Age-related nuclear cataract, bilateral: Secondary | ICD-10-CM | POA: Diagnosis not present

## 2023-10-15 DIAGNOSIS — H52203 Unspecified astigmatism, bilateral: Secondary | ICD-10-CM | POA: Diagnosis not present

## 2023-11-08 DIAGNOSIS — H21561 Pupillary abnormality, right eye: Secondary | ICD-10-CM | POA: Diagnosis not present

## 2023-11-08 DIAGNOSIS — H2181 Floppy iris syndrome: Secondary | ICD-10-CM | POA: Diagnosis not present

## 2023-11-08 DIAGNOSIS — Z961 Presence of intraocular lens: Secondary | ICD-10-CM | POA: Diagnosis not present

## 2023-11-08 DIAGNOSIS — H2511 Age-related nuclear cataract, right eye: Secondary | ICD-10-CM | POA: Diagnosis not present

## 2023-12-06 DIAGNOSIS — H25812 Combined forms of age-related cataract, left eye: Secondary | ICD-10-CM | POA: Diagnosis not present

## 2023-12-06 DIAGNOSIS — H21562 Pupillary abnormality, left eye: Secondary | ICD-10-CM | POA: Diagnosis not present

## 2023-12-06 DIAGNOSIS — H2512 Age-related nuclear cataract, left eye: Secondary | ICD-10-CM | POA: Diagnosis not present

## 2023-12-06 DIAGNOSIS — H2181 Floppy iris syndrome: Secondary | ICD-10-CM | POA: Diagnosis not present

## 2023-12-06 DIAGNOSIS — Z961 Presence of intraocular lens: Secondary | ICD-10-CM | POA: Diagnosis not present

## 2023-12-18 DIAGNOSIS — N401 Enlarged prostate with lower urinary tract symptoms: Secondary | ICD-10-CM | POA: Diagnosis not present

## 2023-12-18 DIAGNOSIS — R3912 Poor urinary stream: Secondary | ICD-10-CM | POA: Diagnosis not present

## 2023-12-18 DIAGNOSIS — N5201 Erectile dysfunction due to arterial insufficiency: Secondary | ICD-10-CM | POA: Diagnosis not present

## 2023-12-18 DIAGNOSIS — R351 Nocturia: Secondary | ICD-10-CM | POA: Diagnosis not present

## 2024-01-07 DIAGNOSIS — R7309 Other abnormal glucose: Secondary | ICD-10-CM | POA: Diagnosis not present

## 2024-01-07 DIAGNOSIS — D352 Benign neoplasm of pituitary gland: Secondary | ICD-10-CM | POA: Diagnosis not present

## 2024-01-14 DIAGNOSIS — D352 Benign neoplasm of pituitary gland: Secondary | ICD-10-CM | POA: Diagnosis not present

## 2024-01-14 DIAGNOSIS — E230 Hypopituitarism: Secondary | ICD-10-CM | POA: Diagnosis not present

## 2024-01-14 DIAGNOSIS — R7309 Other abnormal glucose: Secondary | ICD-10-CM | POA: Diagnosis not present

## 2024-01-14 DIAGNOSIS — E038 Other specified hypothyroidism: Secondary | ICD-10-CM | POA: Diagnosis not present

## 2024-02-07 DIAGNOSIS — G479 Sleep disorder, unspecified: Secondary | ICD-10-CM | POA: Diagnosis not present

## 2024-02-07 DIAGNOSIS — D352 Benign neoplasm of pituitary gland: Secondary | ICD-10-CM | POA: Diagnosis not present

## 2024-02-07 DIAGNOSIS — E230 Hypopituitarism: Secondary | ICD-10-CM | POA: Diagnosis not present

## 2024-02-07 DIAGNOSIS — E038 Other specified hypothyroidism: Secondary | ICD-10-CM | POA: Diagnosis not present

## 2024-02-07 DIAGNOSIS — E2740 Unspecified adrenocortical insufficiency: Secondary | ICD-10-CM | POA: Diagnosis not present

## 2024-02-18 ENCOUNTER — Other Ambulatory Visit: Payer: Self-pay | Admitting: Nurse Practitioner

## 2024-02-18 DIAGNOSIS — D369 Benign neoplasm, unspecified site: Secondary | ICD-10-CM

## 2024-03-27 ENCOUNTER — Ambulatory Visit
Admission: RE | Admit: 2024-03-27 | Discharge: 2024-03-27 | Disposition: A | Discharge status: Home / Self Care | Source: Ambulatory Visit | Attending: Nurse Practitioner | Admitting: Nurse Practitioner

## 2024-03-27 DIAGNOSIS — D369 Benign neoplasm, unspecified site: Secondary | ICD-10-CM

## 2024-03-27 MED ORDER — GADOPICLENOL 0.5 MMOL/ML IV SOLN
10.0000 mL | Freq: Once | INTRAVENOUS | Status: AC | PRN
  Administered 2024-03-27: 10 mL via INTRAVENOUS

## 2024-04-09 ENCOUNTER — Ambulatory Visit (HOSPITAL_COMMUNITY)
Admission: RE | Admit: 2024-04-09 | Discharge: 2024-04-09 | Disposition: A | Discharge status: Home / Self Care | Source: Ambulatory Visit | Attending: Physician Assistant | Admitting: Physician Assistant

## 2024-04-09 DIAGNOSIS — I6523 Occlusion and stenosis of bilateral carotid arteries: Secondary | ICD-10-CM | POA: Diagnosis not present

## 2024-04-10 ENCOUNTER — Ambulatory Visit: Payer: Self-pay | Admitting: Physician Assistant

## 2024-04-11 ENCOUNTER — Encounter: Payer: Self-pay | Admitting: Physician Assistant

## 2024-04-11 DIAGNOSIS — I6529 Occlusion and stenosis of unspecified carotid artery: Secondary | ICD-10-CM | POA: Insufficient documentation

## 2024-04-15 NOTE — Progress Notes (Signed)
 Pt has been made aware of normal result and verbalized understanding.  jw

## 2024-04-23 DIAGNOSIS — D352 Benign neoplasm of pituitary gland: Secondary | ICD-10-CM | POA: Diagnosis not present

## 2024-04-30 DIAGNOSIS — Z86018 Personal history of other benign neoplasm: Secondary | ICD-10-CM | POA: Diagnosis not present

## 2024-04-30 DIAGNOSIS — J342 Deviated nasal septum: Secondary | ICD-10-CM | POA: Diagnosis not present

## 2024-04-30 DIAGNOSIS — J3489 Other specified disorders of nose and nasal sinuses: Secondary | ICD-10-CM | POA: Diagnosis not present

## 2024-04-30 DIAGNOSIS — D497 Neoplasm of unspecified behavior of endocrine glands and other parts of nervous system: Secondary | ICD-10-CM | POA: Diagnosis not present

## 2024-04-30 DIAGNOSIS — Z9889 Other specified postprocedural states: Secondary | ICD-10-CM | POA: Diagnosis not present

## 2024-05-01 ENCOUNTER — Telehealth (HOSPITAL_BASED_OUTPATIENT_CLINIC_OR_DEPARTMENT_OTHER): Payer: Self-pay | Admitting: *Deleted

## 2024-05-01 ENCOUNTER — Other Ambulatory Visit: Payer: Self-pay | Admitting: Neurosurgery

## 2024-05-01 NOTE — Telephone Encounter (Signed)
 Pt has been scheduled tele preop appt 05/06/24. Pt states surgeon office waiting on clearance from cardiology before scheduling procedure. Med rec and consent are done. Pt has been given the cardiac rehab line # [phone] that the preop APP will be calling from.     Patient Consent for Virtual Visit        [name] has provided verbal consent on 05/01/2024 for a virtual visit (video or telephone).   CONSENT FOR VIRTUAL VISIT FOR:  Julian Harris  By participating in this virtual visit I agree to the following:  I hereby voluntarily request, consent and authorize [facility] and its employed or contracted physicians, physician assistants, nurse practitioners or other licensed health care professionals (the Practitioner), to provide me with telemedicine health care services (the "Services) as deemed necessary by the treating Practitioner. I acknowledge and consent to receive the Services by the Practitioner via telemedicine. I understand that the telemedicine visit will involve communicating with the Practitioner through live audiovisual communication technology and the disclosure of certain medical information by electronic transmission. I acknowledge that I have been given the opportunity to request an in-person assessment or other available alternative prior to the telemedicine visit and am voluntarily participating in the telemedicine visit.  I understand that I have the right to withhold or withdraw my consent to the use of telemedicine in the course of my care at any time, without affecting my right to future care or treatment, and that the Practitioner or I may terminate the telemedicine visit at any time. I understand that I have the right to inspect all information obtained and/or recorded in the course of the telemedicine visit and may receive copies of available information for a reasonable fee.  I understand that some of the potential risks of receiving the Services via  telemedicine include:  Delay or interruption in medical evaluation due to technological equipment failure or disruption; Information transmitted may not be sufficient (e.g. poor resolution of images) to allow for appropriate medical decision making by the Practitioner; and/or  In rare instances, security protocols could fail, causing a breach of personal health information.  Furthermore, I acknowledge that it is my responsibility to provide information about my medical history, conditions and care that is complete and accurate to the best of my ability. I acknowledge that Practitioner's advice, recommendations, and/or decision may be based on factors not within their control, such as incomplete or inaccurate data provided by me or distortions of diagnostic images or specimens that may result from electronic transmissions. I understand that the practice of medicine is not an exact science and that Practitioner makes no warranties or guarantees regarding treatment outcomes. I acknowledge that a copy of this consent can be made available to me via my patient portal [facility] MyChart), or I can request a printed copy by calling the office of [facility].    I understand that my insurance will be billed for this visit.   I have read or had this consent read to me. I understand the contents of this consent, which adequately explains the benefits and risks of the Services being provided via telemedicine.  I have been provided ample opportunity to ask questions regarding this consent and the Services and have had my questions answered to my satisfaction. I give my informed consent for the services to be provided through the use of telemedicine in my medical care

## 2024-05-01 NOTE — Telephone Encounter (Signed)
 Spoke with Kerhonkson from [facility] and we were able to coordinate a combo surgery date 05/30/2024 at [facility] with Dr. Lanis. Myles Carrier that I would reach out to patient and provide him with the surgery date. Carrier said once she has scheduled her portion of the surgery, she will email me the case number.

## 2024-05-01 NOTE — Telephone Encounter (Signed)
 Pt has been scheduled tele preop appt 05/06/24. Pt states surgeon office waiting on clearance from cardiology before scheduling procedure. Med rec and consent are done. Pt has been given the cardiac rehab line # [phone] that the preop APP will be calling from.

## 2024-05-01 NOTE — Telephone Encounter (Signed)
   Pre-operative Risk Assessment    Patient Name: CLEMON DEVAUL  DOB: 18-Nov-1940 MRN: [id number]   Date of last office visit: 08/14/23 GLENDIA FERRIER, PAC Date of next office visit: NONE   Request for Surgical Clearance    Procedure:  TRANSPHENOIDAL RESECTION OF PITUITARY MACROADENOMA   Date of Surgery:  Clearance TBD                                Surgeon:  DR. LLEWELLYN Surgeon's Group or Practice Name:  [facility] Phone number:  [phone] Fax number:  [phone]   Type of Clearance Requested:   - Medical ; NONE INDICATED ON FORM TO BE HELD   Type of Anesthesia:  Not Indicated (GENERAL?)   Additional requests/questions:    Bonney Niels Jest   05/01/2024, 11:18 AM

## 2024-05-01 NOTE — Telephone Encounter (Signed)
   Name: Julian Harris  DOB: 12-Sep-1940  MRN: [id number]  Primary Cardiologist: Newman JINNY Lawrence, MD   Preoperative team, please contact this patient and set up a phone call appointment for further preoperative risk assessment. Please obtain consent and complete medication review. Thank you for your help.  I confirm that guidance regarding antiplatelet and oral anticoagulation therapy has been completed and, if necessary, noted below.  I also confirmed the patient resides in the state of [place] . As per [facility] Medical Board telemedicine laws, the patient must reside in the state in which the provider is licensed.   Jon Nat Hails, PA 05/01/2024, 11:27 AM [facility]

## 2024-05-06 ENCOUNTER — Ambulatory Visit: Attending: Cardiovascular Disease | Admitting: Student

## 2024-05-06 ENCOUNTER — Telehealth: Payer: Self-pay

## 2024-05-06 DIAGNOSIS — Z01810 Encounter for preprocedural cardiovascular examination: Secondary | ICD-10-CM | POA: Diagnosis not present

## 2024-05-06 NOTE — Progress Notes (Signed)
 Virtual Visit via Telephone Note   Because of Julian Harris's co-morbid illnesses, he is at least at moderate risk for complications without adequate follow up.  This format is felt to be most appropriate for this patient at this time.  The patient did not have access to video technology/had technical difficulties with video requiring transitioning to audio format only (telephone).  All issues noted in this document were discussed and addressed.  No physical exam could be performed with this format.  Please refer to the patient's chart for his consent to telehealth for [facility].  Evaluation Performed:  Preoperative cardiovascular risk assessment _____________   Date:  05/06/2024   Patient ID:  Julian Harris, DOB Oct 16, 1940, MRN [id number] Patient Location:  Home Provider location:   Office  Primary Care Provider:  Gerome Brunet, DO  Primary Harris:  Julian Harris:  Julian PARAS Lawrence, MD    Chief Complaint / Patient Profile   83 y.o. y/o male with a h/o carotid stenosis, adrenal insufficiency, pituitary macroadenoma who is pending transsphenoidal resection of pituitary macroadenoma by Julian Harris and presents today for telephonic preoperative cardiovascular risk assessment.  History of Present Illness    Julian Harris is a 83 y.o. male who presents via audio/video conferencing for a telehealth visit today.  Pt was last seen in cardiology clinic on 08/14/2023 by Julian Ferrier, PA-C.  At that time Julian Harris was stable from a cardiac standpoint.  The patient is now pending procedure as outlined above. Since his last visit, he is doing well. Patient denies shortness of breath, dyspnea on exertion, lower extremity edema, orthopnea or PND. No chest pain, pressure, or tightness. No palpitations.  He does experience mild lightheadedness secondary to the pituitary adenoma. Patient does not participate in regular exercise right now but is  staying as active as possible. He walks on his home treadmill and does activities around his home.   Past Medical History    Past Medical History:  Diagnosis Date   Adrenal insufficiency    Carotid stenosis 04/11/2024   US  in 2020: bilat 1-39 Carotid US  04/09/2024: No significant ICA stenosis bilaterally    Hearing loss    bilateral - wears hearing aids   Hypertension    no meds, no current prolblems   Hypothyroidism    Macroadenoma    Pituitary mass 2020   Prediabetes    Past Surgical History:  Procedure Laterality Date   BILATERAL KNEE ARTHROSCOPY  07/31/2008   COLONOSCOPY     CYST REMOVAL TRUNK  07/31/2008   HERNIA REPAIR     in 1970s   JOINT REPLACEMENT     bilateral knee replacements   LUMBAR LAMINECTOMY/DECOMPRESSION MICRODISCECTOMY Bilateral 01/09/2023   Procedure: Laminectomy for Facet/Synovial Cyst Bilateral Lumbar Four-Lumbar Five;  Surgeon: Julian Shove, MD;  Location: [facility] OR;  Service: Neurosurgery;  Laterality: Bilateral;  3C   PELVIC FRACTURE SURGERY     with screws and plates    Allergies  Allergies  Allergen Reactions   Trazodone     Other reaction(s): too sedating    Home Medications    Prior to Admission medications   Medication Sig Start Date End Date Taking? Authorizing Provider  finasteride  (PROSCAR ) 5 MG tablet Take 5 mg by mouth daily.    [provider]  HYDROcodone -acetaminophen  (NORCO/VICODIN) 5-325 MG tablet Take 15 mg by mouth in the morning and 10 mg in the afternoon    [provider]  hydrocortisone  (  CORTEF ) 10 MG tablet Take 5-20 mg by mouth See admin instructions. Take 20 mg by mouth in the morning and take 5 mg in the evening.    [provider]  levothyroxine  (SYNTHROID ) 75 MCG tablet Take 1 tablet (75 mcg total) by mouth daily at 6 (six) AM. 08/29/22   Julian Elsie BROCKS, MD  rOPINIRole  (REQUIP ) 1 MG tablet Take 1 mg by mouth at bedtime. 03/11/20   [provider]  tamsulosin  (FLOMAX ) 0.4 MG CAPS  capsule Take 0.4 mg by mouth daily after supper. 03/09/20   [provider]    Physical Exam    Vital Signs:  Julian Harris does not have vital signs available for review today.  Given telephonic nature of communication, physical exam is limited. AAOx3. NAD. Normal affect.  Speech and respirations are unlabored.  Assessment & Plan    Preoperative cardiovascular risk assessment.  Transsphenoidal resection of pituitary macroadenoma by Julian Harris.  Chart reviewed as part of pre-operative protocol coverage. According to the RCRI, patient has a 0.4% risk of MACE. Patient reports activity equivalent to 4.0 METS.   Given past medical history and time since last visit, based on ACC/AHA guidelines, Julian Harris would be at acceptable risk for the planned procedure without further cardiovascular testing.   Patient was advised that if he develops new symptoms prior to surgery to contact our office to arrange a follow-up appointment.  he verbalized understanding.  I will route this recommendation to the requesting party via Epic fax function.  Please call with questions.  Time:   Today, I have spent 6 minutes with the patient with telehealth technology discussing medical history, symptoms, and management plan.     Julian Hila, NP  05/06/2024, 9:14 AM

## 2024-05-06 NOTE — Telephone Encounter (Signed)
   Pre-operative Risk Assessment    Patient Name: Julian Harris  DOB: August 02, 1940 MRN: [id number]   Date of last office visit: 08/14/23 GLENDIA FERRIER, PA-C Date of next office visit: NONE   Request for Surgical Clearance    Procedure:  PITUITARY TUMOR RESECTION  Date of Surgery:  Clearance 05/30/24                                Surgeon:  DR GERLDINE MAIZES Surgeon's Group or Practice Name:  [facility] Phone number:  [phone]   EXT 8221 Fax number:  [phone]   OR   [phone]   ATTN: NIKKI   Type of Clearance Requested:   - Medical    Type of Anesthesia:  General    Additional requests/questions:    SignedLucie DELENA Ku   05/06/2024, 5:30 PM

## 2024-05-12 ENCOUNTER — Other Ambulatory Visit: Payer: Self-pay | Admitting: Otolaryngology

## 2024-05-26 NOTE — Progress Notes (Signed)
 Surgical Instructions   Your procedure is scheduled on Friday, October 31st. Report to [facility] Main Entrance A at 5:00 A.M., then check in with the Admitting office. Any questions or running late day of surgery: call [phone]  Questions prior to your surgery date: call [phone], Monday-Friday, 8am-4pm. If you experience any cold or flu symptoms such as cough, fever, chills, shortness of breath, etc. between now and your scheduled surgery, please notify us  at the above number.     Remember:  Do not eat or drink after midnight the night before your surgery. This includes no gum, mints, or hard candy.  Take these medicines the morning of surgery with A SIP OF WATER  finasteride  (PROSCAR )  hydrocortisone  (CORTEF )  levothyroxine  (SYNTHROID )    May take these medicines IF NEEDED: NONE   One week prior to surgery, STOP taking any Aspirin (unless otherwise instructed by your surgeon) Aleve, Naproxen, Ibuprofen, Motrin, Advil, Goody's, BC's, all herbal medications, fish oil, and non-prescription vitamins.                     Do NOT Smoke (Tobacco/Vaping) for 24 hours prior to your procedure.  If you use a CPAP at night, you may bring your mask/headgear for your overnight stay.   You will be asked to remove any contacts, glasses, piercing's, hearing aid's, dentures/partials prior to surgery. Please bring cases for these items if needed.    Patients discharged the day of surgery will not be allowed to drive home, and someone needs to stay with them for 24 hours.  SURGICAL WAITING ROOM VISITATION Patients may have no more than 2 support people in the waiting area - these visitors may rotate.   Pre-op nurse will coordinate an appropriate time for 1 ADULT support person, who may not rotate, to accompany patient in pre-op.  Children under the age of 85 must have an adult with them who is not the patient and must remain in the main waiting area with an adult.  If the patient needs  to stay at the hospital during part of their recovery, the visitor guidelines for inpatient rooms apply.  Please refer to the [facility] website for the visitor guidelines for any additional information.   If you received a COVID test during your pre-op visit  it is requested that you wear a mask when out in public, stay away from anyone that may not be feeling well and notify your surgeon if you develop symptoms. If you have been in contact with anyone that has tested positive in the last 10 days please notify you surgeon.      Pre-operative CHG Bathing Instructions   You can play a key role in reducing the risk of infection after surgery. Your skin needs to be as free of germs as possible. You can reduce the number of germs on your skin by washing with CHG (chlorhexidine  gluconate) soap before surgery. CHG is an antiseptic soap that kills germs and continues to kill germs even after washing.   DO NOT use if you have an allergy to chlorhexidine /CHG or antibacterial soaps. If your skin becomes reddened or irritated, stop using the CHG and notify one of our RNs at [phone].              TAKE A SHOWER THE NIGHT BEFORE SURGERY   Please keep in mind the following:  DO NOT shave, including legs and underarms, 48 hours prior to surgery.   You may shave your  face before/day of surgery.  Place clean sheets on your bed the night before surgery Use a clean washcloth (not used since being washed) for shower. DO NOT sleep with pet's night before surgery.  CHG Shower Instructions:  Wash your face and private area with normal soap. If you choose to wash your hair, wash first with your normal shampoo.  After you use shampoo/soap, rinse your hair and body thoroughly to remove shampoo/soap residue.  Turn the water OFF and apply half the bottle of CHG soap to a CLEAN washcloth.  Apply CHG soap ONLY FROM YOUR NECK DOWN TO YOUR TOES (washing for 3-5 minutes)  DO NOT use CHG soap on face, private  areas, open wounds, or sores.  Pay special attention to the area where your surgery is being performed.  If you are having back surgery, having someone wash your back for you may be helpful. Wait 2 minutes after CHG soap is applied, then you may rinse off the CHG soap.  Pat dry with a clean towel  Put on clean pajamas    Additional instructions for the day of surgery: If you choose, you may shower the morning of surgery with an antibacterial soap.  DO NOT APPLY any lotions, deodorants, cologne, or perfumes.   Do not wear jewelry or makeup Do not wear nail polish, gel polish, artificial nails, or any other type of covering on natural nails (fingers and toes) Do not bring valuables to the hospital. [facility] is not responsible for valuables/personal belongings. Put on clean/comfortable clothes.  Please brush your teeth.  Ask your nurse before applying any prescription medications to the skin.

## 2024-05-27 ENCOUNTER — Encounter (HOSPITAL_COMMUNITY): Payer: Self-pay

## 2024-05-27 ENCOUNTER — Other Ambulatory Visit: Payer: Self-pay

## 2024-05-27 ENCOUNTER — Encounter (HOSPITAL_COMMUNITY)
Admission: RE | Admit: 2024-05-27 | Discharge: 2024-05-27 | Disposition: A | Discharge status: Home / Self Care | Source: Ambulatory Visit | Attending: Neurosurgery | Admitting: Neurosurgery

## 2024-05-27 VITALS — BP 167/109 | HR 79 | Temp 97.7°F | Resp 17 | Ht 69.0 in | Wt 223.0 lb

## 2024-05-27 DIAGNOSIS — J342 Deviated nasal septum: Secondary | ICD-10-CM | POA: Diagnosis present

## 2024-05-27 DIAGNOSIS — R0981 Nasal congestion: Secondary | ICD-10-CM | POA: Diagnosis present

## 2024-05-27 DIAGNOSIS — E039 Hypothyroidism, unspecified: Secondary | ICD-10-CM | POA: Diagnosis not present

## 2024-05-27 DIAGNOSIS — Z8249 Family history of ischemic heart disease and other diseases of the circulatory system: Secondary | ICD-10-CM | POA: Diagnosis not present

## 2024-05-27 DIAGNOSIS — I083 Combined rheumatic disorders of mitral, aortic and tricuspid valves: Secondary | ICD-10-CM | POA: Insufficient documentation

## 2024-05-27 DIAGNOSIS — Z79899 Other long term (current) drug therapy: Secondary | ICD-10-CM | POA: Diagnosis not present

## 2024-05-27 DIAGNOSIS — I951 Orthostatic hypotension: Secondary | ICD-10-CM | POA: Insufficient documentation

## 2024-05-27 DIAGNOSIS — R0609 Other forms of dyspnea: Secondary | ICD-10-CM | POA: Insufficient documentation

## 2024-05-27 DIAGNOSIS — E230 Hypopituitarism: Secondary | ICD-10-CM | POA: Diagnosis present

## 2024-05-27 DIAGNOSIS — D352 Benign neoplasm of pituitary gland: Secondary | ICD-10-CM | POA: Insufficient documentation

## 2024-05-27 DIAGNOSIS — E2740 Unspecified adrenocortical insufficiency: Secondary | ICD-10-CM | POA: Diagnosis present

## 2024-05-27 DIAGNOSIS — Z01812 Encounter for preprocedural laboratory examination: Secondary | ICD-10-CM | POA: Insufficient documentation

## 2024-05-27 DIAGNOSIS — Z885 Allergy status to narcotic agent status: Secondary | ICD-10-CM | POA: Diagnosis not present

## 2024-05-27 DIAGNOSIS — I10 Essential (primary) hypertension: Secondary | ICD-10-CM | POA: Diagnosis not present

## 2024-05-27 DIAGNOSIS — Z96653 Presence of artificial knee joint, bilateral: Secondary | ICD-10-CM | POA: Diagnosis present

## 2024-05-27 DIAGNOSIS — Z9889 Other specified postprocedural states: Secondary | ICD-10-CM | POA: Diagnosis not present

## 2024-05-27 DIAGNOSIS — Z01818 Encounter for other preprocedural examination: Secondary | ICD-10-CM

## 2024-05-27 DIAGNOSIS — Z87891 Personal history of nicotine dependence: Secondary | ICD-10-CM | POA: Diagnosis not present

## 2024-05-27 DIAGNOSIS — E038 Other specified hypothyroidism: Secondary | ICD-10-CM | POA: Insufficient documentation

## 2024-05-27 DIAGNOSIS — Z79890 Hormone replacement therapy (postmenopausal): Secondary | ICD-10-CM | POA: Diagnosis not present

## 2024-05-27 DIAGNOSIS — Z974 Presence of external hearing-aid: Secondary | ICD-10-CM | POA: Diagnosis not present

## 2024-05-27 DIAGNOSIS — H9190 Unspecified hearing loss, unspecified ear: Secondary | ICD-10-CM | POA: Diagnosis present

## 2024-05-27 LAB — BASIC METABOLIC PANEL WITH GFR
Anion gap: 9 (ref 5–15)
BUN: 16 mg/dL (ref 8–23)
CO2: 25 mmol/L (ref 22–32)
Calcium: 8.7 mg/dL — ABNORMAL LOW (ref 8.9–10.3)
Chloride: 107 mmol/L (ref 98–111)
Creatinine, Ser: 1.15 mg/dL (ref 0.61–1.24)
GFR, Estimated: >60 mL/min (ref >60)
Glucose, Bld: 94 mg/dL (ref 70–99)
Potassium: 3.8 mmol/L (ref 3.5–5.1)
Sodium: 141 mmol/L (ref 135–145)

## 2024-05-27 LAB — CBC
HCT: 43.6 % (ref 39.0–52.0)
Hemoglobin: 14 g/dL (ref 13.0–17.0)
MCH: 31.7 pg (ref 26.0–34.0)
MCHC: 32.1 g/dL (ref 30.0–36.0)
MCV: 98.6 fL (ref 80.0–100.0)
Platelets: 194 K/uL (ref 150–400)
RBC: 4.42 MIL/uL (ref 4.22–5.81)
RDW: 14.1 % (ref 11.5–15.5)
WBC: 5.9 K/uL (ref 4.0–10.5)
nRBC: 0 % (ref 0.0–0.2)

## 2024-05-27 NOTE — Progress Notes (Signed)
 PCP - Dr. Lonell Collet Cardiologist - Dr. Newman Lawrence, LOV 05/06/2024, clearance Endocrinologist: Harlene Siva, NP  PPM/ICD - denies Device Orders - na Rep Notified - na  Chest x-ray - na EKG - 08/14/2023 Stress Test - 40 years ago ECHO - 10/08/2018 Cardiac Cath -   Sleep Study - denies CPAP - na  Non-diabetic  Blood Thinner Instructions: denies Aspirin Instructions:denies  ERAS Protcol - NPO  Anesthesia review: Yes. HTN, cardiac clearance, carotid stenosis  Patient denies shortness of breath, fever, cough and chest pain at PAT appointment   All instructions explained to the patient, with a verbal understanding of the material. Patient agrees to go over the instructions while at home for a better understanding. Patient also instructed to self quarantine after being tested for COVID-19. The opportunity to ask questions was provided.

## 2024-05-28 NOTE — Anesthesia Preprocedure Evaluation (Signed)
 Anesthesia Evaluation  Patient identified by MRN, date of birth, ID band Patient awake    Reviewed: Allergy & Precautions, NPO status , Patient's Chart, lab work & pertinent test results  History of Anesthesia Complications Negative for: history of anesthetic complications  Airway Mallampati: I  TM Distance: >3 FB Neck ROM: Full    Dental  (+) Teeth Intact, Dental Advisory Given, Caps   Pulmonary neg COPD, neg recent URI, former smoker   breath sounds clear to auscultation       Cardiovascular hypertension, + Peripheral Vascular Disease (Carotid Stenosis)   Rhythm:Regular Rate:Normal  TTE 2024: Left ventricle cavity is normal in size. Moderate concentric hypertrophy  of the left ventricle. Normal global wall motion. Normal LV systolic  function with visual EF 50-55%. Indeterminate diastolic filling pattern,  normal LAP.  Trileaflet aortic valve. Aortic sclerosis. Trace aortic regurgitation.  Mild calcification of the mitral valve annulus. Mild mitral valve leaflet  thickening with mild calcification. Mildly restricted mitral valve  leaflets. Trace mitral regurgitation.  Mild tricuspid regurgitation. Estimated pulmonary artery systolic pressure  33 mmHg.  Compared to previous study in 2021, aortic sclerosis, mod LVH are new.  LVEF was reported 60-65% then.     Neuro/Psych    GI/Hepatic negative GI ROS,,,  Endo/Other  Hypothyroidism (Central)  Pituitary Macroadenoma; Adrenal Insufficiency on Hydrocortisone   Renal/GU Renal InsufficiencyRenal disease   BPH    Musculoskeletal   Abdominal   Peds  Hematology   Anesthesia Other Findings   Reproductive/Obstetrics                              Anesthesia Physical Anesthesia Plan  ASA: 3  Anesthesia Plan: General   Post-op Pain Management:    Induction: Intravenous  PONV Risk Score and Plan: 2 and Ondansetron , Aprepitant and Treatment  may vary due to age or medical condition  Airway Management Planned: Oral ETT  Additional Equipment: Arterial line  Intra-op Plan:   Post-operative Plan: Extubation in OR  Informed Consent:      Dental advisory given  Plan Discussed with: CRNA and Surgeon  Anesthesia Plan Comments: (83 year old male with PMH of HTN, carotid stenosis, pre-diabetes, CKD2, pituitary macroadenoma with associated adrenal insufficiency and central hypothyroidism - scheduled for craniotomy and tumor removal. Currently on hydrocortisone  25 mg daily - will plan to stress dose with 100 mg intraoperatively. Type and Screen active - Hgb 14, Plts 194. Plan for GETA, PIV x 2, arterial line, remifentanil infusion for akinesis/sympathectomy and intraoperative stress dose steroids as mentioned. )         Anesthesia Quick Evaluation

## 2024-05-28 NOTE — Progress Notes (Signed)
 Anesthesia Chart Review:  83 year old male with pertinent history including adrenal insufficiency, pituitary macroadenoma, central hypothyroidism.   Follows with endocrinology for history of adrenal sufficiency.  Maintained on hydrocortisone  15  mg in the morning and 10 mg in the afternoon.  This is managed by Harlene Bill, NP.   Follows with cardiology for history of mild orthostatic hypotension and exertional dyspnea. Echo 11/23/2022 showed moderate LVH, EF 50 to 55%, no significant valvular abnormalities.  Seen by Barnie Hila, NP on 05/06/2024 for preop evaluation.  Per note, Chart reviewed as part of pre-operative protocol coverage. According to the RCRI, patient has a 0.4% risk of MACE. Patient reports activity equivalent to 4.0 METS. Given past medical history and time since last visit, based on ACC/AHA guidelines, Julian Harris would be at acceptable risk for the planned procedure without further cardiovascular testing. Patient was advised that if he develops new symptoms prior to surgery to contact our office to arrange a follow-up appointment.  he verbalized understanding.   Preop labs reviewed, unremarkable.   EKG 08/14/2023: Sinus rhythm with Premature atrial complexes.  Rate 79. Anteroseptal infarct (cited on or before 14-Aug-2023). Low voltage QRS.  No significant change.   MRI brain 03/27/2024: IMPRESSION: 1. Avidly enhancing circumscribed mass within the sella/suprasellar region, increased in size from 15 x 17 x 16 mm to 18 x 20 x 15 mm, abutting the undersurface of the optic chiasm and extending to the medial surfaces of the cavernous segments of the internal carotid arteries bilaterally. The mass remains compatible with pituitary macroadenoma. 2. Generalized cerebral volume loss and mild periventricular and deep cerebral white matter disease.  Echocardiogram 11/23/2022:  Left ventricle cavity is normal in size. Moderate concentric hypertrophy  of the left ventricle.  Normal global wall motion. Normal LV systolic  function with visual EF 50-55%. Indeterminate diastolic filling pattern,  normal LAP.  Trileaflet aortic valve. Aortic sclerosis. Trace aortic regurgitation.  Mild calcification of the mitral valve annulus. Mild mitral valve leaflet  thickening with mild calcification. Mildly restricted mitral valve  leaflets. Trace mitral regurgitation.  Mild tricuspid regurgitation. Estimated pulmonary artery systolic pressure  33 mmHg.  Compared to previous study in 2021, aortic sclerosis, mod LVH are new.  LVEF was reported 60-65% then.     Lynwood Geofm RIGGERS [facility] Short Stay Center/Anesthesiology Phone [phone] 05/28/2024 1:10 PM

## 2024-05-30 ENCOUNTER — Encounter (HOSPITAL_COMMUNITY): Payer: Self-pay | Admitting: Neurosurgery

## 2024-05-30 ENCOUNTER — Inpatient Hospital Stay (HOSPITAL_COMMUNITY): Payer: Self-pay | Admitting: Anesthesiology

## 2024-05-30 ENCOUNTER — Inpatient Hospital Stay (HOSPITAL_COMMUNITY)
Admission: RE | Admit: 2024-05-30 | Discharge: 2024-06-02 | DRG: 614 | Disposition: A | Discharge status: Home / Self Care | Attending: Neurosurgery | Admitting: Neurosurgery

## 2024-05-30 ENCOUNTER — Encounter (HOSPITAL_COMMUNITY)
Admission: RE | Disposition: A | Discharge status: Home / Self Care | Payer: Self-pay | Source: Home / Self Care | Attending: Neurosurgery

## 2024-05-30 ENCOUNTER — Other Ambulatory Visit: Payer: Self-pay

## 2024-05-30 DIAGNOSIS — Z885 Allergy status to narcotic agent status: Secondary | ICD-10-CM | POA: Diagnosis not present

## 2024-05-30 DIAGNOSIS — E2740 Unspecified adrenocortical insufficiency: Secondary | ICD-10-CM | POA: Diagnosis present

## 2024-05-30 DIAGNOSIS — Z974 Presence of external hearing-aid: Secondary | ICD-10-CM

## 2024-05-30 DIAGNOSIS — Z9889 Other specified postprocedural states: Secondary | ICD-10-CM | POA: Diagnosis not present

## 2024-05-30 DIAGNOSIS — H9190 Unspecified hearing loss, unspecified ear: Secondary | ICD-10-CM | POA: Diagnosis present

## 2024-05-30 DIAGNOSIS — J342 Deviated nasal septum: Secondary | ICD-10-CM | POA: Diagnosis present

## 2024-05-30 DIAGNOSIS — Z87891 Personal history of nicotine dependence: Secondary | ICD-10-CM

## 2024-05-30 DIAGNOSIS — Z79890 Hormone replacement therapy (postmenopausal): Secondary | ICD-10-CM | POA: Diagnosis not present

## 2024-05-30 DIAGNOSIS — D352 Benign neoplasm of pituitary gland: Secondary | ICD-10-CM | POA: Diagnosis not present

## 2024-05-30 DIAGNOSIS — E039 Hypothyroidism, unspecified: Secondary | ICD-10-CM | POA: Diagnosis present

## 2024-05-30 DIAGNOSIS — I10 Essential (primary) hypertension: Secondary | ICD-10-CM

## 2024-05-30 DIAGNOSIS — E230 Hypopituitarism: Secondary | ICD-10-CM | POA: Diagnosis present

## 2024-05-30 DIAGNOSIS — Z8249 Family history of ischemic heart disease and other diseases of the circulatory system: Secondary | ICD-10-CM | POA: Diagnosis not present

## 2024-05-30 DIAGNOSIS — Z01812 Encounter for preprocedural laboratory examination: Secondary | ICD-10-CM | POA: Diagnosis not present

## 2024-05-30 DIAGNOSIS — Z79899 Other long term (current) drug therapy: Secondary | ICD-10-CM | POA: Diagnosis not present

## 2024-05-30 DIAGNOSIS — Z96653 Presence of artificial knee joint, bilateral: Secondary | ICD-10-CM | POA: Diagnosis present

## 2024-05-30 DIAGNOSIS — R0981 Nasal congestion: Secondary | ICD-10-CM | POA: Diagnosis present

## 2024-05-30 HISTORY — PX: CRANIOTOMY: SHX93

## 2024-05-30 HISTORY — PX: SINUS ENDO W/FUSION: SHX777

## 2024-05-30 HISTORY — PX: TRANSPHENOIDAL APPROACH EXPOSURE: SHX6311

## 2024-05-30 HISTORY — PX: COMPLEX WOUND CLOSURE: SHX6446

## 2024-05-30 LAB — POCT I-STAT 7, (LYTES, BLD GAS, ICA,H+H)
Acid-base deficit: 3 mmol/L — ABNORMAL HIGH (ref 0.0–2.0)
Bicarbonate: 22.6 mmol/L (ref 20.0–28.0)
Calcium, Ion: 1.11 mmol/L — ABNORMAL LOW (ref 1.15–1.40)
HCT: 37 % — ABNORMAL LOW (ref 39.0–52.0)
Hemoglobin: 12.6 g/dL — ABNORMAL LOW (ref 13.0–17.0)
O2 Saturation: 100 %
Potassium: 3.3 mmol/L — ABNORMAL LOW (ref 3.5–5.1)
Sodium: 140 mmol/L (ref 135–145)
TCO2: 24 mmol/L (ref 22–32)
pCO2 arterial: 40.1 mmHg (ref 32–48)
pH, Arterial: 7.359 (ref 7.35–7.45)
pO2, Arterial: 180 mmHg — ABNORMAL HIGH (ref 83–108)

## 2024-05-30 SURGERY — CRANIOTOMY HYPOPHYSECTOMY TRANSNASAL APPROACH
Anesthesia: General

## 2024-05-30 MED ORDER — ORAL CARE MOUTH RINSE: 15.0000 mL | Freq: Once | OROMUCOSAL | Status: AC

## 2024-05-30 MED ORDER — TAMSULOSIN HCL 0.4 MG PO CAPS
0.4000 mg | ORAL_CAPSULE | Freq: Every day | ORAL | Status: DC
  Administered 2024-05-31 – 2024-06-01 (×2): 0.4 mg via ORAL
  Filled 2024-05-30 (×2): qty 1

## 2024-05-30 MED ORDER — ACETAMINOPHEN 325 MG PO TABS
650.0000 mg | ORAL_TABLET | ORAL | Status: DC | PRN
  Administered 2024-05-31 – 2024-06-02 (×2): 650 mg via ORAL
  Filled 2024-05-30 (×2): qty 2

## 2024-05-30 MED ORDER — BUPIVACAINE HCL (PF) 0.5 % IJ SOLN
INTRAMUSCULAR | Status: AC
Start: 2024-05-30 — End: 2024-05-30
  Filled 2024-05-30: qty 30

## 2024-05-30 MED ORDER — DROPERIDOL 2.5 MG/ML IJ SOLN: 0.6250 mg | Freq: Once | INTRAMUSCULAR | Status: DC | PRN

## 2024-05-30 MED ORDER — SODIUM CHLORIDE 0.9 % IV SOLN: INTRAVENOUS | Status: DC | PRN

## 2024-05-30 MED ORDER — THROMBIN 5000 UNITS EX KIT
PACK | CUTANEOUS | Status: AC
  Filled 2024-05-30: qty 1

## 2024-05-30 MED ORDER — OXYCODONE HCL 5 MG/5ML PO SOLN: 5.0000 mg | Freq: Once | ORAL | Status: DC | PRN

## 2024-05-30 MED ORDER — PROPOFOL 10 MG/ML IV BOLUS
INTRAVENOUS | Status: DC | PRN
  Administered 2024-05-30: 50 mg via INTRAVENOUS
  Administered 2024-05-30: 150 mg via INTRAVENOUS

## 2024-05-30 MED ORDER — LIDOCAINE-EPINEPHRINE 1 %-1:100000 IJ SOLN
INTRAMUSCULAR | Status: AC
Start: 2024-05-30 — End: 2024-05-30
  Filled 2024-05-30: qty 1

## 2024-05-30 MED ORDER — ONDANSETRON HCL 4 MG/2ML IJ SOLN: 4.0000 mg | Freq: Once | INTRAMUSCULAR | Status: DC | PRN

## 2024-05-30 MED ORDER — PROMETHAZINE HCL 25 MG PO TABS: 12.5000 mg | ORAL_TABLET | ORAL | Status: DC | PRN

## 2024-05-30 MED ORDER — ORAL CARE MOUTH RINSE: 15.0000 mL | OROMUCOSAL | Status: DC | PRN

## 2024-05-30 MED ORDER — OXYCODONE HCL 5 MG PO TABS: 5.0000 mg | ORAL_TABLET | Freq: Once | ORAL | Status: DC | PRN

## 2024-05-30 MED ORDER — ONDANSETRON HCL 4 MG/2ML IJ SOLN: 4.0000 mg | INTRAMUSCULAR | Status: DC | PRN

## 2024-05-30 MED ORDER — HYDROCORTISONE SOD SUC (PF) 100 MG IJ SOLR
50.0000 mg | Freq: Once | INTRAMUSCULAR | Status: AC
  Administered 2024-05-30: 50 mg via INTRAVENOUS
  Filled 2024-05-30: qty 2

## 2024-05-30 MED ORDER — ROCURONIUM BROMIDE 10 MG/ML (PF) SYRINGE
PREFILLED_SYRINGE | INTRAVENOUS | Status: DC | PRN
  Administered 2024-05-30: 20 mg via INTRAVENOUS
  Administered 2024-05-30: 80 mg via INTRAVENOUS

## 2024-05-30 MED ORDER — BISACODYL 10 MG RE SUPP
10.0000 mg | Freq: Every day | RECTAL | Status: DC | PRN
Start: 2024-05-30 — End: 2024-06-02

## 2024-05-30 MED ORDER — CEFAZOLIN SODIUM-DEXTROSE 2-4 GM/100ML-% IV SOLN
2.0000 g | INTRAVENOUS | Status: AC
  Administered 2024-05-30: 2 g via INTRAVENOUS

## 2024-05-30 MED ORDER — VASOPRESSIN 20 UNIT/ML IV SOLN
INTRAVENOUS | Status: DC | PRN
  Administered 2024-05-30 (×2): 1 [IU] via INTRAVENOUS

## 2024-05-30 MED ORDER — SENNOSIDES-DOCUSATE SODIUM 8.6-50 MG PO TABS: 1.0000 | ORAL_TABLET | Freq: Every evening | ORAL | Status: DC | PRN

## 2024-05-30 MED ORDER — CHLORHEXIDINE GLUCONATE CLOTH 2 % EX PADS
6.0000 | MEDICATED_PAD | Freq: Every day | CUTANEOUS | Status: DC
  Administered 2024-05-30 – 2024-05-31 (×2): 6 via TOPICAL

## 2024-05-30 MED ORDER — HEMOSTATIC AGENTS (NO CHARGE) OPTIME
TOPICAL | Status: DC | PRN
  Administered 2024-05-30 (×2): 1 via TOPICAL

## 2024-05-30 MED ORDER — HEMOSTATIC AGENTS (NO CHARGE) OPTIME
TOPICAL | Status: DC | PRN
  Administered 2024-05-30: 1 via TOPICAL

## 2024-05-30 MED ORDER — ALBUMIN HUMAN 5 % IV SOLN: INTRAVENOUS | Status: DC | PRN

## 2024-05-30 MED ORDER — 0.9 % SODIUM CHLORIDE (POUR BTL) OPTIME
TOPICAL | Status: DC | PRN
  Administered 2024-05-30: 1000 mL

## 2024-05-30 MED ORDER — ACETAMINOPHEN 650 MG RE SUPP
650.0000 mg | RECTAL | Status: DC | PRN
Start: 2024-05-30 — End: 2024-06-02

## 2024-05-30 MED ORDER — LABETALOL HCL 5 MG/ML IV SOLN
10.0000 mg | INTRAVENOUS | Status: DC | PRN
  Administered 2024-05-30 (×3): 20 mg via INTRAVENOUS
  Administered 2024-05-30: 10 mg via INTRAVENOUS
  Administered 2024-05-30: 20 mg via INTRAVENOUS
  Administered 2024-05-30 – 2024-05-31 (×2): 10 mg via INTRAVENOUS
  Administered 2024-06-01 (×2): 20 mg via INTRAVENOUS
  Administered 2024-06-01 (×2): 10 mg via INTRAVENOUS
  Administered 2024-06-01 (×2): 20 mg via INTRAVENOUS
  Administered 2024-06-02 (×3): 10 mg via INTRAVENOUS
  Filled 2024-05-30 (×11): qty 4

## 2024-05-30 MED ORDER — FENTANYL CITRATE (PF) 250 MCG/5ML IJ SOLN
INTRAMUSCULAR | Status: DC | PRN
Start: 2024-05-30 — End: 2024-05-30
  Administered 2024-05-30: 100 ug via INTRAVENOUS

## 2024-05-30 MED ORDER — CEFAZOLIN SODIUM-DEXTROSE 2-4 GM/100ML-% IV SOLN
2.0000 g | Freq: Three times a day (TID) | INTRAVENOUS | Status: AC
  Administered 2024-05-30 – 2024-05-31 (×3): 2 g via INTRAVENOUS
  Filled 2024-05-30 (×3): qty 100

## 2024-05-30 MED ORDER — ADHERUS DURAL SEALANT
PACK | TOPICAL | Status: DC | PRN
Start: 2024-05-30 — End: 2024-05-30
  Administered 2024-05-30: 1 via TOPICAL

## 2024-05-30 MED ORDER — ACETAMINOPHEN 10 MG/ML IV SOLN
INTRAVENOUS | Status: AC
  Filled 2024-05-30: qty 100

## 2024-05-30 MED ORDER — FENTANYL CITRATE (PF) 100 MCG/2ML IJ SOLN
INTRAMUSCULAR | Status: AC
  Filled 2024-05-30: qty 2

## 2024-05-30 MED ORDER — CHLORHEXIDINE GLUCONATE CLOTH 2 % EX PADS: 6.0000 | MEDICATED_PAD | Freq: Once | CUTANEOUS | Status: DC

## 2024-05-30 MED ORDER — HYDROCORTISONE 5 MG PO TABS
15.0000 mg | ORAL_TABLET | Freq: Every day | ORAL | Status: DC
  Administered 2024-05-31 – 2024-06-02 (×3): 15 mg via ORAL
  Filled 2024-05-30 (×3): qty 1

## 2024-05-30 MED ORDER — LABETALOL HCL 5 MG/ML IV SOLN
INTRAVENOUS | Status: AC
  Filled 2024-05-30: qty 4

## 2024-05-30 MED ORDER — LEVOTHYROXINE SODIUM 75 MCG PO TABS
75.0000 ug | ORAL_TABLET | Freq: Every day | ORAL | Status: DC
  Administered 2024-05-31 – 2024-06-02 (×3): 75 ug via ORAL
  Filled 2024-05-30 (×3): qty 1

## 2024-05-30 MED ORDER — EPINEPHRINE HCL (NASAL) 0.1 % NA SOLN
NASAL | Status: AC
  Filled 2024-05-30: qty 60

## 2024-05-30 MED ORDER — SODIUM CHLORIDE 0.9 % IV SOLN
0.1000 ug/kg/min | INTRAVENOUS | Status: AC
  Administered 2024-05-30: .05 ug/kg/min via INTRAVENOUS
  Filled 2024-05-30: qty 2000

## 2024-05-30 MED ORDER — HYDROCORTISONE SOD SUC (PF) 100 MG IJ SOLR
100.0000 mg | Freq: Once | INTRAMUSCULAR | Status: AC
  Administered 2024-05-30: 100 mg via INTRAVENOUS
  Filled 2024-05-30: qty 2

## 2024-05-30 MED ORDER — HYDROCORTISONE 5 MG PO TABS: 5.0000 mg | ORAL_TABLET | ORAL | Status: DC

## 2024-05-30 MED ORDER — THROMBIN 5000 UNITS EX SOLR
OROMUCOSAL | Status: DC | PRN
  Administered 2024-05-30: 5 mL via TOPICAL

## 2024-05-30 MED ORDER — LACTATED RINGERS IV SOLN: INTRAVENOUS | Status: DC

## 2024-05-30 MED ORDER — MORPHINE SULFATE (PF) 2 MG/ML IV SOLN: 1.0000 mg | INTRAVENOUS | Status: DC | PRN

## 2024-05-30 MED ORDER — CHLORHEXIDINE GLUCONATE 0.12 % MT SOLN: 15.0000 mL | Freq: Once | OROMUCOSAL | Status: AC

## 2024-05-30 MED ORDER — PROPOFOL 10 MG/ML IV BOLUS
INTRAVENOUS | Status: AC
Start: 2024-05-30 — End: 2024-05-30
  Filled 2024-05-30: qty 20

## 2024-05-30 MED ORDER — ACETAMINOPHEN 10 MG/ML IV SOLN
1000.0000 mg | Freq: Once | INTRAVENOUS | Status: DC | PRN
  Administered 2024-05-30: 1000 mg via INTRAVENOUS

## 2024-05-30 MED ORDER — CEFAZOLIN SODIUM-DEXTROSE 2-4 GM/100ML-% IV SOLN
INTRAVENOUS | Status: AC
  Filled 2024-05-30: qty 100

## 2024-05-30 MED ORDER — SUGAMMADEX SODIUM 200 MG/2ML IV SOLN
INTRAVENOUS | Status: DC | PRN
  Administered 2024-05-30: 200 mg via INTRAVENOUS

## 2024-05-30 MED ORDER — ONDANSETRON HCL 4 MG/2ML IJ SOLN
INTRAMUSCULAR | Status: DC | PRN
  Administered 2024-05-30: 4 mg via INTRAVENOUS

## 2024-05-30 MED ORDER — FENTANYL CITRATE (PF) 100 MCG/2ML IJ SOLN
25.0000 ug | INTRAMUSCULAR | Status: DC | PRN
  Administered 2024-05-30: 50 ug via INTRAVENOUS
  Administered 2024-05-30 (×2): 25 ug via INTRAVENOUS

## 2024-05-30 MED ORDER — CHLORHEXIDINE GLUCONATE CLOTH 2 % EX PADS
6.0000 | MEDICATED_PAD | Freq: Once | CUTANEOUS | Status: AC
  Administered 2024-05-30: 6 via TOPICAL

## 2024-05-30 MED ORDER — EPINEPHRINE 0.1 % (1MG/ML) IJ FOR NASAL USE
INTRAMUSCULAR | Status: DC | PRN
  Administered 2024-05-30: 1 [drp] via TOPICAL

## 2024-05-30 MED ORDER — ROPINIROLE HCL 1 MG PO TABS
2.0000 mg | ORAL_TABLET | Freq: Every day | ORAL | Status: DC
  Administered 2024-05-30 – 2024-06-01 (×3): 2 mg via ORAL
  Filled 2024-05-30 (×5): qty 2

## 2024-05-30 MED ORDER — VASOPRESSIN 20 UNIT/ML IV SOLN
INTRAVENOUS | Status: AC
  Filled 2024-05-30: qty 1

## 2024-05-30 MED ORDER — HYDROCORTISONE 10 MG PO TABS
10.0000 mg | ORAL_TABLET | Freq: Every day | ORAL | Status: DC
  Administered 2024-05-31 – 2024-06-01 (×2): 10 mg via ORAL
  Filled 2024-05-30 (×4): qty 1

## 2024-05-30 MED ORDER — ONDANSETRON HCL 4 MG PO TABS: 4.0000 mg | ORAL_TABLET | ORAL | Status: DC | PRN

## 2024-05-30 MED ORDER — LIDOCAINE 2% (20 MG/ML) 5 ML SYRINGE
INTRAMUSCULAR | Status: DC | PRN
  Administered 2024-05-30: 80 mg via INTRAVENOUS

## 2024-05-30 MED ORDER — SALINE SPRAY 0.65 % NA SOLN
2.0000 | NASAL | Status: DC
  Administered 2024-05-30 – 2024-06-02 (×12): 2 via NASAL
  Filled 2024-05-30: qty 44

## 2024-05-30 MED ORDER — CHLORHEXIDINE GLUCONATE 0.12 % MT SOLN
OROMUCOSAL | Status: AC
  Administered 2024-05-30: 15 mL via OROMUCOSAL
  Filled 2024-05-30: qty 15

## 2024-05-30 MED ORDER — HYDROCODONE-ACETAMINOPHEN 5-325 MG PO TABS
1.0000 | ORAL_TABLET | ORAL | Status: DC | PRN
  Administered 2024-05-31 – 2024-06-02 (×3): 1 via ORAL
  Filled 2024-05-30 (×4): qty 1

## 2024-05-30 MED ORDER — SODIUM CHLORIDE 0.9 % IR SOLN
Status: DC | PRN
  Administered 2024-05-30: 1000 mL

## 2024-05-30 MED ORDER — OXYMETAZOLINE HCL 0.05 % NA SOLN
2.0000 | Freq: Two times a day (BID) | NASAL | Status: AC | PRN
Start: 2024-05-30 — End: 2024-06-02
  Administered 2024-05-31: 2 via NASAL
  Filled 2024-05-30: qty 30

## 2024-05-30 MED ORDER — FINASTERIDE 5 MG PO TABS
5.0000 mg | ORAL_TABLET | Freq: Every day | ORAL | Status: DC
  Administered 2024-05-30 – 2024-06-02 (×4): 5 mg via ORAL
  Filled 2024-05-30 (×4): qty 1

## 2024-05-30 MED ORDER — CEFAZOLIN SODIUM 1 G IJ SOLR
INTRAMUSCULAR | Status: AC
  Filled 2024-05-30: qty 20

## 2024-05-30 MED ORDER — LIDOCAINE-EPINEPHRINE 1 %-1:100000 IJ SOLN
INTRAMUSCULAR | Status: DC | PRN
  Administered 2024-05-30: 14 mL

## 2024-05-30 MED ORDER — PHENYLEPHRINE HCL-NACL 20-0.9 MG/250ML-% IV SOLN
INTRAVENOUS | Status: DC | PRN
  Administered 2024-05-30 (×2): 160 ug via INTRAVENOUS
  Administered 2024-05-30: 80 ug/min via INTRAVENOUS
  Administered 2024-05-30: 80 ug via INTRAVENOUS
  Administered 2024-05-30: 160 ug via INTRAVENOUS
  Administered 2024-05-30 (×2): 80 ug via INTRAVENOUS

## 2024-05-30 MED ORDER — SODIUM CHLORIDE 0.9 % IV SOLN: INTRAVENOUS | Status: AC

## 2024-05-30 MED ORDER — EPHEDRINE SULFATE (PRESSORS) 25 MG/5ML IV SOSY
PREFILLED_SYRINGE | INTRAVENOUS | Status: DC | PRN
Start: 2024-05-30 — End: 2024-05-30
  Administered 2024-05-30: 5 mg via INTRAVENOUS

## 2024-05-30 MED ORDER — PANTOPRAZOLE SODIUM 40 MG IV SOLR
40.0000 mg | Freq: Every day | INTRAVENOUS | Status: DC
  Administered 2024-05-30: 40 mg via INTRAVENOUS
  Filled 2024-05-30: qty 10

## 2024-05-30 SURGICAL SUPPLY — 92 items
BAG COUNTER SPONGE SURGICOUNT (BAG) ×2 IMPLANT
BENZOIN TINCTURE PRP APPL 2/3 (GAUZE/BANDAGES/DRESSINGS) ×1 IMPLANT
BLADE NAVIG QUADCUT 4.3X13 M4 (BLADE) ×1 IMPLANT
BLADE SURG 11 STRL SS (BLADE) ×2 IMPLANT
BLADE SURG 15 STRL LF DISP TIS (BLADE) IMPLANT
BUR TAPER CHOANAL ATRESIA 30K (BURR) ×1 IMPLANT
CANISTER SUCTION 3000ML PPV (SUCTIONS) ×3 IMPLANT
COAGULATOR SUCT 8FR VV (MISCELLANEOUS) IMPLANT
COAGULATOR SUCT SWTCH 10FR 6 (ELECTROSURGICAL) IMPLANT
COVER BACK TABLE 60X90IN (DRAPES) IMPLANT
DEFOGGER MIRROR 1QT (MISCELLANEOUS) ×1 IMPLANT
DRAPE HALF SHEET 40X57 (DRAPES) ×2 IMPLANT
DRAPE INCISE IOBAN 66X45 STRL (DRAPES) ×1 IMPLANT
DRAPE MICROSCOPE SLANT 54X150 (MISCELLANEOUS) IMPLANT
DRAPE SURG 17X23 STRL (DRAPES) ×3 IMPLANT
DRESSING NASAL POPE 10X1.5X2.5 (GAUZE/BANDAGES/DRESSINGS) IMPLANT
DRSG NASOPORE 8CM (GAUZE/BANDAGES/DRESSINGS) IMPLANT
DURAPREP 26ML APPLICATOR (WOUND CARE) ×1 IMPLANT
ELECT COATED BLADE 2.86 ST (ELECTRODE) IMPLANT
ELECT NDL TIP 2.8 STRL (NEEDLE) ×1 IMPLANT
ELECT NEEDLE TIP 2.8 STRL (NEEDLE) ×1 IMPLANT
ELECTRODE NDL INSULATED 6.5 (ELECTROSURGICAL) IMPLANT
ELECTRODE REM PT RTRN 9FT ADLT (ELECTROSURGICAL) ×2 IMPLANT
FILTER ARTHROSCOPY CONVERTOR (FILTER) ×1 IMPLANT
GAUZE PACKING FOLDED 2 STR (GAUZE/BANDAGES/DRESSINGS) ×1 IMPLANT
GAUZE SPONGE 2X2 8PLY STRL LF (GAUZE/BANDAGES/DRESSINGS) ×1 IMPLANT
GAUZE SPONGE 4X4 12PLY STRL (GAUZE/BANDAGES/DRESSINGS) ×1 IMPLANT
GLOVE BIO SURGEON STRL SZ 6.5 (GLOVE) ×1 IMPLANT
GLOVE BIOGEL PI IND STRL 7.5 (GLOVE) ×1 IMPLANT
GLOVE ECLIPSE 7.0 STRL STRAW (GLOVE) ×1 IMPLANT
GOWN STRL REUS W/ TWL LRG LVL3 (GOWN DISPOSABLE) ×2 IMPLANT
GOWN STRL REUS W/ TWL XL LVL3 (GOWN DISPOSABLE) IMPLANT
GOWN STRL REUS W/TWL 2XL LVL3 (GOWN DISPOSABLE) ×1 IMPLANT
GRAFT DURAGEN MATRIX 1WX1L (Tissue) IMPLANT
HEMOSTAT ARISTA ABSORB 3G PWDR (HEMOSTASIS) IMPLANT
HEMOSTAT POWDER KIT SURGIFOAM (HEMOSTASIS) ×1 IMPLANT
HEMOSTAT SURGICEL 2X14 (HEMOSTASIS) IMPLANT
IV 0.9% NACL 1000 ML (IV SOLUTION) ×2 IMPLANT
KIT BASIN OR (CUSTOM PROCEDURE TRAY) ×2 IMPLANT
KIT DRAIN CSF ACCUDRAIN (MISCELLANEOUS) IMPLANT
KIT TURNOVER KIT B (KITS) ×2 IMPLANT
KNIFE ARACHNOID DISP AM-21-S (BLADE) IMPLANT
NDL HYPO 25GX1X1/2 BEV (NEEDLE) ×2 IMPLANT
NDL HYPO 25X1 1.5 SAFETY (NEEDLE) ×1 IMPLANT
NDL SPNL 22GX3.5 QUINCKE BK (NEEDLE) ×1 IMPLANT
NDL SPNL 25GX3.5 QUINCKE BL (NEEDLE) ×1 IMPLANT
NEEDLE HYPO 25GX1X1/2 BEV (NEEDLE) ×2 IMPLANT
NEEDLE HYPO 25X1 1.5 SAFETY (NEEDLE) ×1 IMPLANT
NEEDLE SPNL 22GX3.5 QUINCKE BK (NEEDLE) ×1 IMPLANT
NEEDLE SPNL 25GX3.5 QUINCKE BL (NEEDLE) ×1 IMPLANT
PACK LAMINECTOMY NEURO (CUSTOM PROCEDURE TRAY) ×1 IMPLANT
PAD ARMBOARD POSITIONER FOAM (MISCELLANEOUS) ×3 IMPLANT
PAD MAGNETIC INSTR ST 16X20 (MISCELLANEOUS) ×1 IMPLANT
PATTIES SURGICAL .5 X3 (DISPOSABLE) ×2 IMPLANT
SEALANT ADHERUS EXTEND TIP (MISCELLANEOUS) IMPLANT
SET IV EXT TUBING 30 (IV SETS) IMPLANT
SHEATH ENDOSCRUB 0 DEG (SHEATH) ×1 IMPLANT
SHEATH ENDOSCRUB 30 DEG (SHEATH) IMPLANT
SHEATH ENDOSCRUB 45 DEG (SHEATH) IMPLANT
SOLN 0.9% NACL POUR BTL 1000ML (IV SOLUTION) ×2 IMPLANT
SOLN STERILE WATER BTL 1000 ML (IV SOLUTION) ×1 IMPLANT
SOLUTION ANTFG W/FOAM PAD STRL (MISCELLANEOUS) ×1 IMPLANT
SPLINT NASAL DOYLE BI-VL (GAUZE/BANDAGES/DRESSINGS) IMPLANT
SPLINT NASAL POSISEP X .6X2 (GAUZE/BANDAGES/DRESSINGS) ×1 IMPLANT
SPLINT NASAL POSISEP X2 .8X2.3 (GAUZE/BANDAGES/DRESSINGS) IMPLANT
SPONGE SURGIFOAM ABS GEL 12-7 (HEMOSTASIS) IMPLANT
SPONGE SURGIFOAM ABS GEL SZ50 (HEMOSTASIS) IMPLANT
STAPLER SKIN PROX 35W (STAPLE) ×1 IMPLANT
STRIP CLOSURE SKIN 1/2X4 (GAUZE/BANDAGES/DRESSINGS) ×1 IMPLANT
SUCTION TUBE FRAZIER 10FR DISP (SUCTIONS) ×1 IMPLANT
SUT CHROMIC 4 0 P 3 18 (SUTURE) IMPLANT
SUT ETHILON 3 0 PS 1 (SUTURE) IMPLANT
SUT ETHILON 6 0 P 1 (SUTURE) IMPLANT
SUT PDS AB 4-0 RB1 27 (SUTURE) IMPLANT
SUT PDS PLUS AB 5-0 RB-1 (SUTURE) IMPLANT
SUT PLAIN 4 0 ~~LOC~~ 1 (SUTURE) IMPLANT
SUT SILK 2 0 SH (SUTURE) ×1 IMPLANT
SUT VIC AB 4-0 P-3 18X BRD (SUTURE) IMPLANT
SWAB COLLECTION DEVICE MRSA (MISCELLANEOUS) IMPLANT
SWAB CULTURE ESWAB REG 1ML (MISCELLANEOUS) IMPLANT
SYR CONTROL 10ML LL (SYRINGE) IMPLANT
SYR TB 1ML LUER SLIP (SYRINGE) ×2 IMPLANT
TOWEL GREEN STERILE (TOWEL DISPOSABLE) ×1 IMPLANT
TOWEL GREEN STERILE FF (TOWEL DISPOSABLE) ×2 IMPLANT
TRACKER ENT INSTRUMENT (MISCELLANEOUS) ×2 IMPLANT
TRACKER ENT PATIENT (MISCELLANEOUS) ×1 IMPLANT
TRAP SPECIMEN MUCUS 40CC (MISCELLANEOUS) IMPLANT
TRAY ENT MC OR (CUSTOM PROCEDURE TRAY) ×1 IMPLANT
TRAY FOLEY MTR SLVR 16FR STAT (SET/KITS/TRAYS/PACK) ×1 IMPLANT
TUBE CONNECTING 12X1/4 (SUCTIONS) ×1 IMPLANT
TUBING FEATHERFLOW (TUBING) IMPLANT
TUBING STRAIGHTSHOT EPS 5PK (TUBING) ×1 IMPLANT

## 2024-05-30 NOTE — H&P (Signed)
 Chief Complaint   Pituitary tumor  History of Present Illness  Julian Harris is a 83 year old man I am seeing for the above although he is an established patient of my partner, Dr. Louis. He comes in today after recent MRI of the pituitary demonstrates some enlargement of his known pituitary adenoma. Briefly, the patient had discovery of this lesion at least five years ago on MRI. He has been diagnosed with panhypopituitarism and is currently under the care of endocrinology. He is on exogenous steroids and thyroid  replacement medication. He does not report any new headaches or visual changes.  Of note, aside from his hypopituitarism, he does not report any other medical problems denies hypertension, diabetes, previous heart attack or stroke. No known lung, liver, kidney disease. He is not on any blood thinners. He is a nonsmoker.   Past Medical History   Past Medical History:  Diagnosis Date   Adrenal insufficiency    Carotid stenosis 04/11/2024   US  in 2020: bilat 1-39 Carotid US  04/09/2024: No significant ICA stenosis bilaterally    Hearing loss    bilateral - wears hearing aids   Hypertension    no meds, no current prolblems   Hypothyroidism    Macroadenoma    Pituitary mass 2020   Prediabetes     Past Surgical History   Past Surgical History:  Procedure Laterality Date   BILATERAL KNEE ARTHROSCOPY  07/31/2008   COLONOSCOPY     CYST REMOVAL TRUNK  07/31/2008   HERNIA REPAIR     in 1970s   JOINT REPLACEMENT     bilateral knee replacements   LUMBAR LAMINECTOMY/DECOMPRESSION MICRODISCECTOMY Bilateral 01/09/2023   Procedure: Laminectomy for Facet/Synovial Cyst Bilateral Lumbar Four-Lumbar Five;  Surgeon: Louis Shove, MD;  Location: [facility] OR;  Service: Neurosurgery;  Laterality: Bilateral;  3C   PELVIC FRACTURE SURGERY     with screws and plates    Social History   Social History   Tobacco Use   Smoking status: Former    Current packs/day: 0.00    Average packs/day: 0.5  packs/day for 15.0 years (7.5 ttl pk-yrs)    Types: Cigarettes    Start date: 11/26/1958    Quit date: 11/25/1973    Years since quitting: 50.5   Smokeless tobacco: Never  Vaping Use   Vaping status: Never Used  Substance Use Topics   Alcohol use: Not Currently    Comment: occasional   Drug use: No    Medications   Prior to Admission medications   Medication Sig Start Date End Date Taking? Authorizing Provider  finasteride  (PROSCAR ) 5 MG tablet Take 5 mg by mouth daily.   Yes [provider]  hydrocortisone  (CORTEF ) 10 MG tablet Take 5-20 mg by mouth See admin instructions. Take 15 mg by mouth in the morning and 10 mg in the mid afternoon   Yes [provider]  levothyroxine  (SYNTHROID ) 75 MCG tablet Take 1 tablet (75 mcg total) by mouth daily at 6 (six) AM. 08/29/22  Yes Lue Elsie BROCKS, MD  rOPINIRole  (REQUIP ) 1 MG tablet Take 2 mg by mouth at bedtime. 03/11/20  Yes [provider]  tamsulosin  (FLOMAX ) 0.4 MG CAPS capsule Take 0.4 mg by mouth daily after supper. 03/09/20  Yes [provider]    Allergies   Allergies  Allergen Reactions   Trazodone     Other reaction(s): too sedating    Review of Systems  ROS  Neurologic Exam  Awake, alert, oriented Memory and concentration grossly intact  Speech fluent, appropriate CN grossly intact Motor exam: Upper Extremities Deltoid Bicep Tricep Grip  Right 5/5 5/5 5/5 5/5  Left 5/5 5/5 5/5 5/5   Lower Extremities IP Quad PF DF EHL  Right 5/5 5/5 5/5 5/5 5/5  Left 5/5 5/5 5/5 5/5 5/5   Sensation grossly intact to LT  Imaging  MRI demonstrates slightly enlarged sellar/suprasellar mass abutting the optic apparatus  Impression  - 83 y.o. male with enlarging pituitary tumor and associated panhypopituitarism  Plan  - Will proceed with endoscopic transnasal resection of tumor  I have reviewed the indications for the procedure as well as the details of the procedure and the expected  postoperative course and recovery at length with the patient in the office. We have also reviewed in detail the risks, benefits, and alternatives to the procedure. All questions were answered and Julian Harris provided informed consent to proceed.  Gerldine Maizes, MD [facility]

## 2024-05-30 NOTE — Transfer of Care (Signed)
 Immediate Anesthesia Transfer of Care Note  Patient: Julian Harris  Procedure(s) Performed: CRANIOTOMY HYPOPHYSECTOMY TRANSNASAL APPROACH ENDOSCOPY, PARANASAL SINUS, WITH SPHENOID SINUSOTOMY SINUS SURGERY, ENDOSCOPIC, USING COMPUTER-ASSISTED NAVIGATION COMPLEX CLOSURE, WOUND  Patient Location: PACU  Anesthesia Type:General  Level of Consciousness: awake, alert , and oriented  Airway & Oxygen Therapy: Patient Spontanous Breathing  Post-op Assessment: Report given to RN and Post -op Vital signs reviewed and stable  Post vital signs: Reviewed and stable  Last Vitals:  Vitals Value Taken Time  BP    Temp    Pulse    Resp    SpO2      Last Pain:  Vitals:   05/30/24 0601  TempSrc:   PainSc: 0-No pain      Patients Stated Pain Goal: 0 (05/30/24 0601)  Complications: No notable events documented.

## 2024-05-30 NOTE — Progress Notes (Signed)
 Dr. CANDIE Birmingham made aware of patient's elevated BP readings this morning. Ok per Dr. Birmingham.

## 2024-05-30 NOTE — Op Note (Signed)
OPERATIVE NOTE  Julian Harris Date/Time of Admission: 05/30/2024  5:23 AM  CSN: [id number];FMW:[id number] Attending Provider: Lanis Pupa, MD Room/Bed: MCPO/NONE DOB: 01-21-1941 Age: 84 y.o.   Pre-Op Diagnosis: Pituitary macroadenoma with extrasellar extension Deviated nasal septum  Post-Op Diagnosis: Pituitary macroadenoma with extrasellar extension Deviated nasal septum  Procedure: Procedure(s): ENDOSCOPIC TRANSSPHENOIDAL RESECTION OF PITUITARY TUMOR WITH STEREOTACTIC NAVIGATION  Anesthesia: General  Surgeon(s): Gerard DELENA Shope, DO  Staff: Circulator: Cox, Lauraine POUR, RN; Tomas Krabbe, RN; Shona Greig DASEN, RN Scrub Person: Sheela Birdena CINDI Orvil Asberry POUR  Implants: Implant Name Type Inv. Item Serial No. Manufacturer Lot No. LRB No. Used Action  GRAFT DURAGEN MATRIX [id number] - [id number] Tissue GRAFT DURAGEN MATRIX [id number]  [employer] [id number] N/A 1 Implanted    Specimens: ID Type Source Tests Collected by Time Destination  1 : Pituitary tumor Tissue PATH Soft tissue SURGICAL PATHOLOGY Lanis Pupa, MD 05/30/2024 [phone]   2 : 2 Pituitary tumor Tissue PATH Soft tissue SURGICAL PATHOLOGY Lanis Pupa, MD 05/30/2024 1002     Complications: None  EBL: 50 ML  Condition: stable  Operative Findings:  Mild left septal deviation with large bone spur on left  Description of Operation: Once operative consent was obtained and the site and surgery were confirmed with the patient and the operating room team, the patient was brought back to the operating room and general endotracheal anesthesia was obtained. The patient was then turned over to the ENT service, at which time the image-guided system was attached and noted to be in good calibration. Lidocaine  1% with 1:100,000 epinephrine was injected into the inferior turbinates bilaterally, the middle turbinates bilaterally, and the axilla between the medial turbinate and the lateral nasal wall.  Afrin-soaked pledgets were placed into the nasal cavity, and the patient was prepped and draped in sterile fashion.  Attention was then turned to the right nasal vestibule. The inferior and middle turbinate were then gently lateralized using a Cottle elevator until the superior turbinate was identified.  This was also lateralized until the natural sinus ostium was appreciated.  This was serially dilated using a mushroom punch and an up-biting Kerrison.  A posterior septectomy was then performed using a combination of a Cottle elevator, Tru-Cut forceps and Kerrisons.  The nasoseptal flap pedicle was carefully preserved. The left middle turbinate and superior turbinate were then identified and gently lateralized until the natural sphenoid os was identified and serially dilated using the sphenoid dilator, mushroom punch and up-biting Kerrisons.  With the bilateral sphenoid cavities in view, the remaining bone of the sphenoid face was removed using Kerrison forceps.  A diamond bur drill was also used to complete the bony resection and to remove the intrasinus septum until the bone overlying the pituitary adenoma was completely visualized.  This bone was removed using Kerrison forceps until the tumor was completely visualized. Dr. Lanis then scrubbed in and completely resected the tumor, please see his operative note for details regarding the repair.  There was no evidence of CSF leak following tumor removal. Floseal was placed in the defect and Duragen and Adheris was used to completely cover the defect. Arista and Posisep nasal packing was placed in bilateral nasal cavities in the sphenoid defect.  An orogastric tube was placed and the stomach cavity was suctioned to reduce postoperative nausea. The patient was turned over to anesthesia service and was extubated in the operating room and transferred to the PACU in stable condition.    [name] A Coltan Spinello, DO [facility]  05/30/2024    

## 2024-05-30 NOTE — Op Note (Signed)
 NEUROSURGERY OPERATIVE NOTE   PREOP DIAGNOSIS:  Pituitary tumor   POSTOP DIAGNOSIS: Same  PROCEDURE: Endoscopic transnasal transsphenoidal resection of pituitary tumor  SURGEON: Dr. Gerldine Maizes, MD  CO-SURGEON: Dr. Gerard Shope, DO  ANESTHESIA: General Endotracheal  EBL: Minimal  SPECIMENS: Pituitary tumor for permanent pathology  DRAINS: None  COMPLICATIONS: None immediate  CONDITION: Hemodynamically stable to PACU  HISTORY: Julian Harris is a 83 y.o. male with a known within known pituitary tumor which has been monitored radiographically.  He is under the care of an endocrinologist with panhypopituitarism.  Most recent MRI scan has demonstrated slight interval growth in comparison to the previous study, with tumor abutting the inferior aspect of the optic apparatus.  He was seen in the outpatient neurosurgery clinic where, due to his radiographic findings, surgical resection was commended.  The risks, benefits, and alternatives to surgery were all reviewed in detail with the patient and his wife.  After all questions were answered informed consent was obtained and witnessed.  PROCEDURE IN DETAIL: The patient was brought to the operating room. After induction of general anesthesia, the patient was positioned on the operative table in the supine position. All pressure points were meticulously padded.  Skin of the mid face was then marked out and prepped and draped in the usual sterile fashion, as well as the abdomen for the possibility of fat graft harvest.  After timeout was conducted, access to the anterior face of the sella was obtained by Dr. Shope, details of which are dictated in a separate operative report.  Very thin bone on the anterior aspect of the sella was easily removed using a combination of rongeur's.  The face of the sella was exposed from carotid to carotid.  The dura overlying the anterior aspect of the sella was then incised in cruciate fashion.   Dural leaflets were dissected, and we immediately encountered soft, tan-colored tumor which was expressed anteriorly from the sella.  This was removed using a combination of ring curettes as well as suction which was collected and sent for permanent pathology.  A series of ring curettes were used to initially remove tumor from the inferior aspect of the sella down to the sellar floor and posteriorly to the dorsum.  Then using a combination of right angled curettes, tumor was removed from the lateral aspect of the sella to the level of the cavernous sinus, and more anteriorly and superiorly.  This was done initially on the patient's right, and then on the left.  As this was done, the diaphragm sella came into view and actually dropped down into the sella almost to the level of the sellar floor.  In this fashion, it did appear that the entirety of the tumor within the sella and suprasellar space was resected.  No CSF egress was noted from the diaphragm.  Hemostasis was then easily secured using a combination of morselized Gelfoam with thrombin .  The sphenoid and sella was then irrigated with normal saline.  The anterior aspect of the sella was covered with a small piece of collagen onlay.  This was secured with polyethylene glycol spray.  The remainder of the sphenoid was then covered with a layer of Gelfoam.  Closure of the transnasal approach was then completed by Dr. Shope.  At the end of the case all sponge, needle, instrument, and cottonoid counts were correct.  Patient was then extubated and taken to the postanesthesia care unit in stable hemodynamic condition.   Julian Maizes, MD [facility]

## 2024-05-30 NOTE — Anesthesia Procedure Notes (Signed)
 Procedure Name: Intubation Date/Time: 05/30/2024 8:01 AM  Performed by: Lockie Flesher, CRNAPre-anesthesia Checklist: Patient identified, Emergency Drugs available, Suction available and Patient being monitored Patient Re-evaluated:Patient Re-evaluated prior to induction Oxygen Delivery Method: Circle system utilized Preoxygenation: Pre-oxygenation with 100% oxygen Induction Type: IV induction Ventilation: Two handed mask ventilation required Laryngoscope Size: Mac and 4 Grade View: Grade I Tube type: Oral Tube size: 7.5 mm Number of attempts: 1 Airway Equipment and Method: Patient positioned with wedge pillow and Stylet Placement Confirmation: ETT inserted through vocal cords under direct vision, positive ETCO2 and CO2 detector Secured at: 23 cm Tube secured with: Tape Dental Injury: Teeth and Oropharynx as per pre-operative assessment

## 2024-05-30 NOTE — Consult Note (Addendum)
 NAME:  Julian Harris, MRN:  [id number], DOB:  January 11, 1941, LOS: 0 ADMISSION DATE:  05/30/2024, CONSULTATION DATE:  05/30/24 REFERRING MD:  Lanis , CHIEF COMPLAINT:  s/p transphenoidal pituitary tumor resection   History of Present Illness:  83 yo M PMH panhypopit, pituitary tumor who presented 05/30/24 for planned endoscopic transnasal transsphenoidal resection of pituitary tumor with NSGY + ENT. On recent MRI tumor was slightly enlarged and abutting optic apparatus, prompting recommendation for resection.   Case unremarkable. No evidence of CSF leak at end of case.  Extubated at end of case and admitted to ICU post op, as per pre op plan    PCCM consulted in this setting     Pertinent  Medical History  Panhypopituitarism   Significant Hospital Events: Including procedures, antibiotic start and stop dates in addition to other pertinent events   05/30/24 endoscopic transsphenoidal pituitary tumor resection   Interim History / Subjective:  POD0   Objective    Blood pressure 128/87, pulse 61, temperature 97.9 F (36.6 C), resp. rate 15, height 5' 9 (1.753 m), weight 99.8 kg, SpO2 95%.        Intake/Output Summary (Last 24 hours) at 05/30/2024 1714 Last data filed at 05/30/2024 1200 Gross per 24 hour  Intake 2400 ml  Output 1080 ml  Net 1320 ml   Filed Weights   05/30/24 0557  Weight: 99.8 kg    Examination: General: Extremely pleasant wdwn older adult M NAD HENT: NCAT  Lungs: CTAb on RA  Cardiovascular: rrr Abdomen: soft ndnt normoactive  Extremities: no acute joint deformity  Neuro: AAOx4 no focal deficits  GU: foley yellow urine   Resolved problem list   Assessment and Plan    Pituitary tumor s/p endoscopic transnasal transsphenoidal resection  Panhypopit  P -post op per NSGY -rcvd 100mg  solucortef during case. will give 50mg  cortef  this evening and resume home cortef  in AM -cont home synthroid   -SBP goal < 160 -- PRN labetalol available  -BMP  this evening and in AM  -keep foley, follow UOP -- watching for polyuria  -cont home proscar  flomax   -adv diet as tolerated   Labs   CBC: Recent Labs  Lab 05/27/24 1130 05/30/24 0840  WBC 5.9  --   HGB 14.0 12.6*  HCT 43.6 37.0*  MCV 98.6  --   PLT 194  --     Basic Metabolic Panel: Recent Labs  Lab 05/27/24 1130 05/30/24 0840  NA 141 140  K 3.8 3.3*  CL 107  --   CO2 25  --   GLUCOSE 94  --   BUN 16  --   CREATININE 1.15  --   CALCIUM 8.7*  --    GFR: Estimated Creatinine Clearance: 56.7 mL/min (by C-G formula based on SCr of 1.15 mg/dL). Recent Labs  Lab 05/27/24 1130  WBC 5.9    Liver Function Tests: No results for input(s): AST, ALT, ALKPHOS, BILITOT, PROT, ALBUMIN  in the last 168 hours. No results for input(s): LIPASE, AMYLASE in the last 168 hours. No results for input(s): AMMONIA in the last 168 hours.  ABG    Component Value Date/Time   PHART 7.359 05/30/2024 0840   PCO2ART 40.1 05/30/2024 0840   PO2ART 180 (H) 05/30/2024 0840   HCO3 22.6 05/30/2024 0840   TCO2 24 05/30/2024 0840   ACIDBASEDEF 3.0 (H) 05/30/2024 0840   O2SAT 100 05/30/2024 0840     Coagulation Profile: No results for input(s): INR, PROTIME in the last  168 hours.  Cardiac Enzymes: No results for input(s): CKTOTAL, CKMB, CKMBINDEX, TROPONINI in the last 168 hours.  HbA1C: No results found for: HGBA1C  CBG: No results for input(s): GLUCAP in the last 168 hours.  Review of Systems:   Review of Systems  Constitutional: Negative.   Eyes:  Negative for blurred vision, double vision, photophobia and pain.  Respiratory: Negative.    Cardiovascular: Negative.   Gastrointestinal: Negative.   Genitourinary: Negative.   Skin: Negative.   Neurological:  Negative for dizziness, sensory change, focal weakness, weakness and headaches.  Psychiatric/Behavioral: Negative.       Past Medical History:  He,  has a past medical history of Adrenal  insufficiency, Carotid stenosis (04/11/2024), Hearing loss, Hypertension, Hypothyroidism, Macroadenoma, Pituitary mass (2020), and Prediabetes.   Surgical History:   Past Surgical History:  Procedure Laterality Date   BILATERAL KNEE ARTHROSCOPY  07/31/2008   COLONOSCOPY     CYST REMOVAL TRUNK  07/31/2008   HERNIA REPAIR     in 1970s   JOINT REPLACEMENT     bilateral knee replacements   LUMBAR LAMINECTOMY/DECOMPRESSION MICRODISCECTOMY Bilateral 01/09/2023   Procedure: Laminectomy for Facet/Synovial Cyst Bilateral Lumbar Four-Lumbar Five;  Surgeon: Louis Shove, MD;  Location: [facility] OR;  Service: Neurosurgery;  Laterality: Bilateral;  3C   PELVIC FRACTURE SURGERY     with screws and plates     Social History:   reports that he quit smoking about 50 years ago. His smoking use included cigarettes. He started smoking about 65 years ago. He has a 7.5 pack-year smoking history. He has never used smokeless tobacco. He reports that he does not currently use alcohol. He reports that he does not use drugs.   Family History:  His family history includes Heart disease in his father.   Allergies Allergies  Allergen Reactions   Trazodone     Other reaction(s): too sedating     Home Medications  Prior to Admission medications   Medication Sig Start Date End Date Taking? Authorizing Provider  finasteride  (PROSCAR ) 5 MG tablet Take 5 mg by mouth daily.   Yes [provider]  hydrocortisone  (CORTEF ) 10 MG tablet Take 5-20 mg by mouth See admin instructions. Take 15 mg by mouth in the morning and 10 mg in the mid afternoon   Yes [provider]  levothyroxine  (SYNTHROID ) 75 MCG tablet Take 1 tablet (75 mcg total) by mouth daily at 6 (six) AM. 08/29/22  Yes Lue Elsie BROCKS, MD  rOPINIRole  (REQUIP ) 1 MG tablet Take 2 mg by mouth at bedtime. 03/11/20  Yes [provider]  tamsulosin  (FLOMAX ) 0.4 MG CAPS capsule Take 0.4 mg by mouth daily after supper. 03/09/20  Yes [provider]     Critical care time: na      Mod MDM    Ronnald Gave MSN, AGACNP-BC [facility] Pulmonary/Critical Care Medicine Amion for pager 05/30/2024, 6:09 PM

## 2024-05-30 NOTE — Discharge Instructions (Signed)
 [facility] ([facility]) Post Operative Instructions  Office: [phone]  The Surgery Itself Endoscopic sinus surgery (with or without septoplasty and turbinate reduction) involves general anesthesia, typically for one to two hours. Patients may be sedated for several hours after surgery and may remain sleepy for the better part of the day. Nausea and vomiting are occasionally seen, and usually resolve by the evening of surgery - even without additional medications.  After Surgery  Facial pressure and fullness similar to a sinus infection/headache is normal after surgery. Breathing through your nose is also difficult due to swelling. A humidifier or vaporizer can be used in the bedroom to prevent throat pain with mouth breathing.   Bloody nasal drainage is normal after this surgery for 5-7 days, usually decreasing in volume with each day that passes. Drainage will flow from the front of the nose and down the back of the throat. Make sure you spit out blood drainage that drips down the back of your throat to prevent nausea/vomiting. You will have a nasal drip pad/sling with gauze to catch drainage from the front of your nose. The dressing may need to be changed frequently during the first 24 hours following surgery. In case of profuse nasal bleeding, you may apply ice to the bridge of the nose and pinch the nose just above the tip and hold for 10 minutes; if bleeding continues, contact the doctors office.   Frequent hot showers or saline nasal rinses (NeilMed) will help break up congestion and clear any clot or mucus that builds up within the nose after surgery. This can be started the day after surgery.   It is more comfortable to sleep with extra pillows or in a recliner for the first few days after surgery until the drainage begins to resolve.    Do not blow your nose for 2 weeks after surgery.   Avoid lifting > 10 lbs. and no vigorous exercise for 2 weeks after surgery.   Avoid  airplane travel for 2 weeks following sinus surgery; the cabin pressure changes can cause pain and swelling within the nose/sinuses.   Sense of smell and taste are often diminished for several weeks after surgery. There may be some tenderness or numbness in your upper front teeth, which is normal after surgery. You may express old clot, discolored mucus or very large nasal crusts from your nose for up to 3-4 weeks after surgery; depending on how frequently and how effectively you irrigate your nose with the saltwater spray.   You may have absorbable sutures inside of your nose after surgery that will slowly dissolve in 2-3 weeks. Be careful when clearing crusts from the nose since they may be attached to these sutures.  Medications  Pain medication can be used for pain as prescribed. Pain and pressure in the nose is expected after surgery. As the surgical site heals, pain will resolve over the course of a week. Pain medications can cause nausea, which can be prevented if you take them with food or milk.   You may be given an antibiotic for one week after surgery to prevent infection. Take this medication with food to prevent nausea or vomiting.   You can use 2 nasal sprays after surgery: Afrin can be used up to 2 times a day for up to 5 days after surgery (best before bed) to reduce bloody drainage from the nose for the first few days after surgery. Saline/salt water spray should be used at least 4-6 times per  day, starting the day after surgery to prevent crusting inside of the nose.   Take all of your routine medications as prescribed, unless told otherwise by your surgeon. Any medications that thin the blood should be avoided. This includes aspirin. Avoid aspirin-like products for the first 72 hours after surgery (Advil, Motrin, Excedrin, Alieve, Celebrex, Naprosyn), but you may use them as needed for pain after 72 hours.

## 2024-05-30 NOTE — Anesthesia Postprocedure Evaluation (Signed)
 Anesthesia Post Note  Patient: Julian Harris  Procedure(s) Performed: CRANIOTOMY HYPOPHYSECTOMY TRANSNASAL APPROACH ENDOSCOPY, PARANASAL SINUS, WITH SPHENOID SINUSOTOMY SINUS SURGERY, ENDOSCOPIC, USING COMPUTER-ASSISTED NAVIGATION COMPLEX CLOSURE, WOUND     Patient location during evaluation: PACU Anesthesia Type: General Level of consciousness: awake Pain management: pain level controlled Vital Signs Assessment: post-procedure vital signs reviewed and stable Respiratory status: spontaneous breathing Cardiovascular status: blood pressure returned to baseline Postop Assessment: no apparent nausea or vomiting Anesthetic complications: no   No notable events documented.  Last Vitals:  Vitals:   05/30/24 1130 05/30/24 1300  BP: (!) 131/94 128/87  Pulse: 77 61  Resp: 15 15  Temp:  36.6 C  SpO2: 92% 95%    Last Pain:  Vitals:   05/30/24 1400  TempSrc:   PainSc: Asleep                 Lauraine KATHEE [place]

## 2024-05-30 NOTE — Anesthesia Procedure Notes (Signed)
 Arterial Line Insertion Start/End10/31/2025 6:58 AM, 05/30/2024 7:00 AM Performed by: Lockie Flesher, CRNA, Suheyb Raucci, Flesher, CRNA, CRNA  Patient location: Pre-op. Preanesthetic checklist: patient identified, IV checked, site marked, risks and benefits discussed, surgical consent and pre-op evaluation Lidocaine  1% used for infiltration Right, radial was placed Catheter size: 20 G Hand hygiene performed  and Seldinger technique used  Attempts: 1 Following insertion, Biopatch and dressing applied. Post procedure assessment: normal

## 2024-05-31 ENCOUNTER — Encounter (HOSPITAL_COMMUNITY): Payer: Self-pay | Admitting: Neurosurgery

## 2024-05-31 LAB — BASIC METABOLIC PANEL WITH GFR
Anion gap: 11 (ref 5–15)
BUN: 18 mg/dL (ref 8–23)
CO2: 18 mmol/L — ABNORMAL LOW (ref 22–32)
Calcium: 7.8 mg/dL — ABNORMAL LOW (ref 8.9–10.3)
Chloride: 108 mmol/L (ref 98–111)
Creatinine, Ser: 1.21 mg/dL (ref 0.61–1.24)
GFR, Estimated: 59 mL/min — ABNORMAL LOW (ref >60)
Glucose, Bld: 118 mg/dL — ABNORMAL HIGH (ref 70–99)
Potassium: 3.6 mmol/L (ref 3.5–5.1)
Sodium: 137 mmol/L (ref 135–145)

## 2024-05-31 LAB — CBC
HCT: 37 % — ABNORMAL LOW (ref 39.0–52.0)
Hemoglobin: 11.7 g/dL — ABNORMAL LOW (ref 13.0–17.0)
MCH: 31.5 pg (ref 26.0–34.0)
MCHC: 31.6 g/dL (ref 30.0–36.0)
MCV: 99.7 fL (ref 80.0–100.0)
Platelets: 142 K/uL — ABNORMAL LOW (ref 150–400)
RBC: 3.71 MIL/uL — ABNORMAL LOW (ref 4.22–5.81)
RDW: 14.7 % (ref 11.5–15.5)
WBC: 7 K/uL (ref 4.0–10.5)
nRBC: 0 % (ref 0.0–0.2)

## 2024-05-31 MED ORDER — PANTOPRAZOLE SODIUM 40 MG PO TBEC
40.0000 mg | DELAYED_RELEASE_TABLET | Freq: Every day | ORAL | Status: DC
  Administered 2024-05-31 – 2024-06-01 (×2): 40 mg via ORAL
  Filled 2024-05-31 (×2): qty 1

## 2024-05-31 NOTE — Consult Note (Signed)
   NAME:  Julian Harris, MRN:  [id number], DOB:  October 15, 1940, LOS: 1 ADMISSION DATE:  05/30/2024, CONSULTATION DATE:  05/30/24 REFERRING MD:  Lanis , CHIEF COMPLAINT:  s/p transphenoidal pituitary tumor resection   History of Present Illness:  83 yo M PMH panhypopit, pituitary tumor who presented 05/30/24 for planned endoscopic transnasal transsphenoidal resection of pituitary tumor with NSGY + ENT. On recent MRI tumor was slightly enlarged and abutting optic apparatus, prompting recommendation for resection.   Case unremarkable. No evidence of CSF leak at end of case.  Extubated at end of case and admitted to ICU post op, as per pre op plan   PCCM consulted in this setting   Pertinent  Medical History  Panhypopituitarism   Significant Hospital Events: Including procedures, antibiotic start and stop dates in addition to other pertinent events   05/30/24 endoscopic transsphenoidal pituitary tumor resection   Interim History / Subjective:   No complaints.  Objective    Blood pressure 134/84, pulse (!) 59, temperature 97.8 F (36.6 C), temperature source Axillary, resp. rate 15, height 5' 9 (1.753 m), weight 99.8 kg, SpO2 95%.        Intake/Output Summary (Last 24 hours) at 05/31/2024 1027 Last data filed at 05/31/2024 0700 Gross per 24 hour  Intake 1173.84 ml  Output 3025 ml  Net -1851.16 ml   Filed Weights   05/30/24 0557  Weight: 99.8 kg    Examination: No focal deficits.  Awake, oriented Regular rate and rhythm Lungs are clear to auscultation  Resolved problem list   Assessment and Plan    Pituitary tumor s/p endoscopic transnasal transsphenoidal resection  Panhypopit  P -post op per NSGY Continue Solu-Cortef , Synthroid  P.o. diet. Observe in ICU for today. Check labs to make sure there are no significant abnormality  Signature:   Lonna Coder MD [facility] See Amion for pager  If no response to pager , please call [phone] until 7pm After 7:00 pm call Elink  [phone] 05/31/2024, 10:28 AM

## 2024-05-31 NOTE — Progress Notes (Signed)
  NEUROSURGERY PROGRESS NOTE   Pt seen and examined. No issues overnight. Some nasal congestion, no drainage. Minimal HA.  EXAM: Temp:  [97.7 F (36.5 C)-98.3 F (36.8 C)] 98 F (36.7 C) (11/01 0400) Pulse Rate:  [57-82] 57 (11/01 0700) Resp:  [14-22] 14 (11/01 0700) BP: (116-155)/(78-119) 141/92 (11/01 0700) SpO2:  [92 %-99 %] 97 % (11/01 0700) Arterial Line BP: (123-167)/(61-104) 150/77 (11/01 0700) Intake/Output      10/31 0701 11/01 0700 11/01 0701 11/02 0700   I.V. (mL/kg) 2673.8 (26.8)    IV Piggyback 900.1    Total Intake(mL/kg) 3573.8 (35.8)    Urine (mL/kg/hr) 3425 (1.4)    Blood 30    Total Output 3455    Net +118.8          Awake, alert, oriented Speech fluent CN intact, intact vision Good strength throughout  LABS: Lab Results  Component Value Date   CREATININE 1.15 05/27/2024   BUN 16 05/27/2024   NA 140 05/30/2024   K 3.3 (L) 05/30/2024   CL 107 05/27/2024   CO2 25 05/27/2024   Lab Results  Component Value Date   WBC 5.9 05/27/2024   HGB 12.6 (L) 05/30/2024   HCT 37.0 (L) 05/30/2024   MCV 98.6 05/27/2024   PLT 194 05/27/2024    IMPRESSION: - 83 y.o. male POD#1 transsphenoidal resection of pituitary tumor, baseline pan-hypopituitarism. No evidence of DI.  PLAN: - Cont supportive care - Recheck Na tomorrow - d/c Foley, a-line - OOB - If stable, likely d/c home tomorrow   Gerldine Maizes, MD [facility]

## 2024-06-01 LAB — SODIUM: Sodium: 140 mmol/L (ref 135–145)

## 2024-06-01 NOTE — Progress Notes (Signed)
 05/31/2024 2330 Received pt to room 4N-10 from 4N-ICU.  Pt is A&Ox4, no C/O voiced.  Tele monitor applied and CCMD notified.  Oriented to room, call light and bed.  Call bell in reach. Dasie Lamarr BROCKS

## 2024-06-01 NOTE — Progress Notes (Signed)
 Patient ID: Julian Harris, male   DOB: 01-12-1941, 83 y.o.   MRN: [id number] BP (!) 156/100 (BP Location: Left Arm)   Pulse 66   Temp 98.9 F (37.2 C) (Oral)   Resp 16   Ht 5' 9 (1.753 m)   Wt 99.8 kg   SpO2 94%   BMI 32.49 kg/m  Alert and oriented x 4 Speech is clear and fluent Moving all extremities well  Will send Na today Doing well Full visual fields

## 2024-06-01 NOTE — Plan of Care (Signed)
  Problem: Education: Goal: Knowledge of General Education information will improve Description: Including pain rating scale, medication(s)/side effects and non-pharmacologic comfort measures Outcome: Progressing   Problem: Health Behavior/Discharge Planning: Goal: Ability to manage health-related needs will improve Outcome: Progressing   Problem: Clinical Measurements: Goal: Ability to maintain clinical measurements within normal limits will improve Outcome: Progressing Goal: Will remain free from infection Outcome: Progressing Goal: Diagnostic test results will improve Outcome: Progressing Goal: Respiratory complications will improve Outcome: Progressing Goal: Cardiovascular complication will be avoided Outcome: Progressing   Problem: Activity: Goal: Risk for activity intolerance will decrease Outcome: Progressing   Problem: Nutrition: Goal: Adequate nutrition will be maintained Outcome: Progressing   Problem: Coping: Goal: Level of anxiety will decrease Outcome: Progressing   Problem: Elimination: Goal: Will not experience complications related to bowel motility Outcome: Progressing Goal: Will not experience complications related to urinary retention Outcome: Progressing   Problem: Pain Managment: Goal: General experience of comfort will improve and/or be controlled Outcome: Progressing   Problem: Safety: Goal: Ability to remain free from injury will improve Outcome: Progressing   Problem: Skin Integrity: Goal: Risk for impaired skin integrity will decrease Outcome: Progressing   Problem: Education: Goal: Knowledge of the prescribed therapeutic regimen will improve Outcome: Progressing   Problem: Clinical Measurements: Goal: Usual level of consciousness will be regained or maintained. Outcome: Progressing Goal: Neurologic status will improve Outcome: Progressing Goal: Ability to maintain intracranial pressure will improve Outcome: Progressing    Problem: Skin Integrity: Goal: Demonstration of wound healing without infection will improve Outcome: Progressing

## 2024-06-02 LAB — SURGICAL PATHOLOGY

## 2024-06-02 MED ORDER — HYDROCODONE-ACETAMINOPHEN 5-325 MG PO TABS: 1.0000 | ORAL_TABLET | Freq: Four times a day (QID) | ORAL | 0 refills | Status: AC | PRN

## 2024-06-02 MED ORDER — SALINE SPRAY 0.65 % NA SOLN: 2.0000 | NASAL | 0 refills | Status: AC

## 2024-06-02 NOTE — Progress Notes (Signed)
   06/02/24 1111  Provider Notification  Provider Name/Title Dr. Lanis  Date Provider Notified 06/02/24  Time Provider Notified 1111  Method of Notification Page  Notification Reason Other (Comment) (Pt's systolic BP >150 requiring multiple doses of IV labetalol yesterday, overnight, and this morning. Pt with no BM.)  Provider response No new orders  Date of Provider Response 06/02/24  Time of Provider Response 1116   Pt's Systolic BP >150 this am. PRN dose of IV labetalol given. Dr. Lanis notified of BP and no BM since admission. MD verbalized to f/u with PCP in regards to BP.  Discharge order to stay in place.

## 2024-06-02 NOTE — Progress Notes (Signed)
   06/02/24 1125  TOC Brief Assessment  Insurance and Status Reviewed  Patient has primary care physician Yes  Home environment has been reviewed home  Prior level of function: self/independent  Prior/Current Home Services No current home services  Social Drivers of Health Review SDOH reviewed no interventions necessary  Readmission risk has been reviewed Yes  Transition of care needs no transition of care needs at this time    Stable for transition home, family and SO to assist post discharge. No HH or DME needs noted.

## 2024-06-02 NOTE — Progress Notes (Signed)
 AVS and discharge education given to pt and pt's partner, Candis. All questions answered. Tele removed and CCMD called. PIV removed with no complications. Pt to pick up prescribed medications from preferred pharmacy on file. Pt made aware to f/u with PCP in regards to BP management. Pt transported off unit via wheelchair with NT and all personal belongings. Pt's partner, Candis to transport pt home.

## 2024-06-02 NOTE — Discharge Summary (Signed)
 Physician Discharge Summary  Patient ID: Julian Harris MRN: [id number] DOB/AGE: Nov 09, 1940 83 y.o.  Admit date: 05/30/2024 Discharge date: 06/02/2024  Admission Diagnoses:  Pituitary macroadenoma  Discharge Diagnoses:  Same Principal Problem:   S/P craniotomy Active Problems:   Pituitary macroadenoma [facility])   Discharged Condition: Stable  Hospital Course:  Julian Harris is a 83 y.o. male admitted after elective endoscopic transsphenoidal resection of pituitary tumor.  Patient was monitored in the intensive care unit without any change in neurologic condition or change in vision.  He had no evidence of diabetes insipidus.  He was transferred to the stepdown unit where he was monitored further.  He was ambulating without difficulty, pain was under control with oral medication, and he requested discharge home.  Treatments: Surgery -endoscopic transnasal transsphenoidal resection of pituitary tumor  Discharge Exam: Blood pressure 116/84, pulse 61, temperature 98.5 F (36.9 C), temperature source Oral, resp. rate 16, height 5' 9 (1.753 m), weight 99.8 kg, SpO2 93%. Awake, alert, oriented Speech fluent, appropriate CN grossly intact 5/5 BUE/BLE Wound c/d/i  Disposition: Discharge disposition: 01-Home or Self Care       Discharge Instructions     Call MD for:  redness, tenderness, or signs of infection (pain, swelling, redness, odor or green/yellow discharge around incision site)   Complete by: As directed    Call MD for:  temperature >100.4   Complete by: As directed    Diet - low sodium heart healthy   Complete by: As directed    Discharge instructions   Complete by: As directed    Walk at home as much as possible, at least 4 times / day   Increase activity slowly   Complete by: As directed    Lifting restrictions   Complete by: As directed    No lifting > 10 lbs   May shower / Bathe   Complete by: As directed    48 hours after surgery   May walk up steps    Complete by: As directed    No dressing needed   Complete by: As directed    Other Restrictions   Complete by: As directed    No bending/twisting at waist      Allergies as of 06/02/2024       Reactions   Trazodone    Other reaction(s): too sedating        Medication List     TAKE these medications    finasteride  5 MG tablet Commonly known as: PROSCAR  Take 5 mg by mouth daily.   HYDROcodone -acetaminophen  5-325 MG tablet Commonly known as: NORCO/VICODIN Take 1 tablet by mouth every 6 (six) hours as needed for up to 5 days for moderate pain (pain score 4-6).   hydrocortisone  10 MG tablet Commonly known as: CORTEF  Take 5-20 mg by mouth See admin instructions. Take 15 mg by mouth in the morning and 10 mg in the mid afternoon   levothyroxine  75 MCG tablet Commonly known as: SYNTHROID  Take 1 tablet (75 mcg total) by mouth daily at 6 (six) AM.   rOPINIRole  1 MG tablet Commonly known as: REQUIP  Take 2 mg by mouth at bedtime.   sodium chloride  0.65 % Soln nasal spray Commonly known as: OCEAN Place 2 sprays into both nostrils every 4 (four) hours while awake.   tamsulosin  0.4 MG Caps capsule Commonly known as: FLOMAX  Take 0.4 mg by mouth daily after supper.               Discharge  Care Instructions  (From admission, onward)           Start     Ordered   06/02/24 0000  No dressing needed        06/02/24 1029            Follow-up Information     Lanis Pupa, MD Follow up.   Specialty: Neurosurgery Contact information: [address] [place] [address] [phone]         Skotnicki, Meghan A, DO Follow up.   Specialty: Otolaryngology Contact information: [address] [place] [address] [phone]                 Signed: Pupa JAYSON Lanis 06/02/2024, 10:29 AM

## 2024-06-06 LAB — TYPE AND SCREEN
ABO/RH(D): O NEG
Antibody Screen: NEGATIVE
Donor AG Type: NEGATIVE
Donor AG Type: NEGATIVE
Unit division: 0
Unit division: 0

## 2024-06-06 LAB — BPAM RBC
Blood Product Expiration Date: 202511152359
Blood Product Expiration Date: 202511172359
Unit Type and Rh: 9500
Unit Type and Rh: 9500

## 2024-06-11 DIAGNOSIS — D497 Neoplasm of unspecified behavior of endocrine glands and other parts of nervous system: Secondary | ICD-10-CM | POA: Diagnosis not present

## 2024-06-11 DIAGNOSIS — Z86018 Personal history of other benign neoplasm: Secondary | ICD-10-CM | POA: Diagnosis not present

## 2024-06-11 DIAGNOSIS — Z9889 Other specified postprocedural states: Secondary | ICD-10-CM | POA: Diagnosis not present

## 2024-06-25 DIAGNOSIS — D497 Neoplasm of unspecified behavior of endocrine glands and other parts of nervous system: Secondary | ICD-10-CM | POA: Diagnosis not present

## 2024-06-25 DIAGNOSIS — J3489 Other specified disorders of nose and nasal sinuses: Secondary | ICD-10-CM | POA: Diagnosis not present

## 2024-07-08 DIAGNOSIS — E038 Other specified hypothyroidism: Secondary | ICD-10-CM | POA: Diagnosis not present

## 2024-07-08 DIAGNOSIS — R7309 Other abnormal glucose: Secondary | ICD-10-CM | POA: Diagnosis not present
